# Patient Record
Sex: Female | Born: 1955 | Race: Black or African American | Hispanic: No | Marital: Single | Smoking: Former smoker
Health system: Southern US, Community
[De-identification: ages and names within clinical notes are randomized; demographics above are authoritative.]

## PROBLEM LIST (undated history)

## (undated) DIAGNOSIS — E119 Type 2 diabetes mellitus without complications: Secondary | ICD-10-CM

## (undated) DIAGNOSIS — E785 Hyperlipidemia, unspecified: Secondary | ICD-10-CM

## (undated) HISTORY — PX: KNEE SURGERY: SHX244

## (undated) HISTORY — PX: CARDIAC CATHETERIZATION: SHX172

---

## 2015-08-23 ENCOUNTER — Emergency Department (HOSPITAL_COMMUNITY)
Admission: EM | Admit: 2015-08-23 | Discharge: 2015-08-23 | Disposition: A | Discharge status: Home / Self Care | Payer: Self-pay | Attending: Emergency Medicine | Admitting: Emergency Medicine

## 2015-08-23 ENCOUNTER — Encounter (HOSPITAL_COMMUNITY): Payer: Self-pay | Admitting: Emergency Medicine

## 2015-08-23 DIAGNOSIS — H5711 Ocular pain, right eye: Secondary | ICD-10-CM | POA: Insufficient documentation

## 2015-08-23 DIAGNOSIS — H5789 Other specified disorders of eye and adnexa: Secondary | ICD-10-CM

## 2015-08-23 DIAGNOSIS — H578 Other specified disorders of eye and adnexa: Secondary | ICD-10-CM | POA: Insufficient documentation

## 2015-08-23 DIAGNOSIS — R51 Headache: Secondary | ICD-10-CM | POA: Insufficient documentation

## 2015-08-23 DIAGNOSIS — H53149 Visual discomfort, unspecified: Secondary | ICD-10-CM | POA: Insufficient documentation

## 2015-08-23 HISTORY — DX: Type 2 diabetes mellitus without complications: E11.9

## 2015-08-23 MED ORDER — TETRACAINE HCL 0.5 % OP SOLN
1.0000 [drp] | Freq: Once | OPHTHALMIC | Status: AC
  Administered 2015-08-23: 1 [drp] via OPHTHALMIC
  Filled 2015-08-23: qty 2

## 2015-08-23 MED ORDER — FLUORESCEIN SODIUM 1 MG OP STRP
1.0000 | ORAL_STRIP | Freq: Once | OPHTHALMIC | Status: AC
Start: 2015-08-23 — End: 2015-08-23
  Administered 2015-08-23: 1 via OPHTHALMIC
  Filled 2015-08-23: qty 1

## 2015-08-23 NOTE — ED Provider Notes (Signed)
CSN: [id number]     Arrival date & time 08/23/15  1316 History  By signing my name below, I, Essence Howell, attest that this documentation has been prepared under the direction and in the presence of Danelle Berry, PA-C Electronically Signed: Charline Bills, ED Scribe 08/23/2015 at 2:56 PM.   Chief Complaint  Patient presents with  . Eye Pain   The history is provided by the patient. No language interpreter was used.   HPI Comments: Karina Pierce is a 60 y.o. female who presents to the Emergency Department complaining of sudden onset of constant, 7/10 right eye pain onset last night. Pt states that she was sitting on her couch watching tv when she initially noticed right eye pain. She describes eye pain as "soreness" that is exacerbated with focusing and palpation. Pt also reports associated eye redness, photophobia and HA only above the right eye. No treatments tried PTA. She denies fever, facial swelling, eye pain with movement, visual disturbances, eye dischagre, nausea, vomiting, neck pain. No h/o similar symptoms.   No past medical history on file. No past surgical history on file. No family history on file. Social History  Substance Use Topics  . Smoking status: Not on file  . Smokeless tobacco: Not on file  . Alcohol Use: Not on file   OB History    No data available     Review of Systems  All other systems reviewed and are negative.  Allergies  Review of patient's allergies indicates not on file.  Home Medications   Prior to Admission medications   Not on File   BP 134/72 mmHg  Pulse 90  Temp(Src) 98.4 F (36.9 C) (Oral)  Resp 16  SpO2 98% Physical Exam  Constitutional: She is oriented to person, place, and time. She appears well-developed and well-nourished. No distress.  Appears very uncomfortable  HENT:  Head: Normocephalic and atraumatic.  Right Ear: External ear normal.  Left Ear: External ear normal.  Nose: Nose normal.  Mouth/Throat: Oropharynx is clear and  moist. No oropharyngeal exudate.  Eyes: EOM and lids are normal. Pupils are equal, round, and reactive to light. Right eye exhibits no discharge. No foreign body present in the right eye. Left eye exhibits no discharge. No foreign body present in the left eye. Right conjunctiva is injected. Left conjunctiva is not injected. No scleral icterus. Right eye exhibits normal extraocular motion and no nystagmus. Left eye exhibits normal extraocular motion and no nystagmus. Pupils are equal.  Slit lamp exam:      The right eye shows no fluorescein uptake and no anterior chamber bulge.       The left eye shows no anterior chamber bulge.      Visual Acuity  Right Eye Distance: 20/25 without correcitve lens Left Eye Distance: 20/25 without corrective lens Bilateral Distance: 20/25 without corrective lens   Neck: Normal range of motion. Neck supple. No JVD present. No tracheal deviation present.  Cardiovascular: Normal rate and regular rhythm.   Pulmonary/Chest: Effort normal and breath sounds normal. No stridor. No respiratory distress.  Musculoskeletal: Normal range of motion. She exhibits no edema.  Lymphadenopathy:    She has no cervical adenopathy.  Neurological: She is alert and oriented to person, place, and time. She exhibits normal muscle tone. Coordination normal.  Skin: Skin is warm and dry. No rash noted. She is not diaphoretic. No erythema. No pallor.  Psychiatric: She has a normal mood and affect. Her behavior is normal. Judgment and thought content normal.  Nursing note and vitals reviewed.  ED Course  Procedures (including critical care time) DIAGNOSTIC STUDIES: Oxygen Saturation is 98% on RA, normal by my interpretation.    COORDINATION OF CARE: 1:43 PM-Discussed treatment plan which includes fluorescein exam and f/u with ophthalmology with pt at bedside and pt agreed to plan.   Labs Review Labs Reviewed - No data to display  Imaging Review No results found.   EKG  Interpretation None      MDM   Pt with sudden onset right eye pain with diffuse conjunctival erythema, no discharge, no injury, vision normal.  Pt has severe photophobia.  EOM's normal.  Fluorescein, no abrasion, ulceration or FB visualized.  No visual acuity deficit.  No hx of contact wearing.  Unable to assess anterior chamber with slit lamp, however no visualized anterior chamber bulge, no ciliary injection IOP:  Left eye 18, right eye 10 Seen in a shared visit with Dr. Adriana Simas. Ophthalmology consulted - Discussed with Dr. Randon [place] who who instructed pt to come to for inoffice eval right now.  No other tx indicated per Dr. Randon [place]. Pt was instructed re: plan, she has transportation, will drive directly to his office.    Final diagnoses:  None   I personally performed the services described in this documentation, which was scribed in my presence. The recorded information has been reviewed and is accurate.      Danelle Berry, PA-C 08/23/15 2006  Donnetta Hutching, MD 08/25/15 [phone]

## 2015-08-23 NOTE — Discharge Instructions (Signed)
Please go to see Dr. Randon [place] now for ophthalmology evaluation.  Pain Without a Known Cause WHAT IS PAIN WITHOUT A KNOWN CAUSE? Pain can occur in any part of the body and can range from mild to severe. Sometimes no cause can be found for why you are having pain. Some types of pain that can occur without a known cause include:   Headache.  Back pain.  Abdominal pain.  Neck pain. HOW IS PAIN WITHOUT A KNOWN CAUSE DIAGNOSED?  Your health care provider will try to find the cause of your pain. This may include:  Physical exam.  Medical history.  Blood tests.  Urine tests.  X-rays. If no cause is found, your health care provider may diagnose you with pain without a known cause.  IS THERE TREATMENT FOR PAIN WITHOUT A CAUSE?  Treatment depends on the kind of pain you have. Your health care provider may prescribe medicines to help relieve your pain.  WHAT CAN I DO AT HOME FOR MY PAIN?   Take medicines only as directed by your health care provider.  Stop any activities that cause pain. During periods of severe pain, bed rest may help.  Try to reduce your stress with activities such as yoga or meditation. Talk to your health care provider for other stress-reducing activity recommendations.  Exercise regularly, if approved by your health care provider.  Eat a healthy diet that includes fruits and vegetables. This may improve pain. Talk to your health care provider if you have any questions about your diet. WHAT IF MY PAIN DOES NOT GET BETTER?  If you have a painful condition and no reason can be found for the pain or the pain gets worse, it is important to follow up with your health care provider. It may be necessary to repeat tests and look further for a possible cause.    This information is not intended to replace advice given to you by your health care provider. Make sure you discuss any questions you have with your health care provider.   Document Released: 03/11/2001 Document  Revised: 07/07/2014 Document Reviewed: 11/01/2013 Elsevier Interactive Patient Education [employer]

## 2015-08-23 NOTE — ED Notes (Signed)
Patient able to ambulate independently  

## 2015-08-23 NOTE — ED Notes (Signed)
Patient states started having eye pain last night and photophobia in R eye.   Patient denies drainage.   Patient states slight headache over the R eye.

## 2015-10-12 ENCOUNTER — Emergency Department (HOSPITAL_COMMUNITY)
Admission: EM | Admit: 2015-10-12 | Discharge: 2015-10-12 | Disposition: A | Discharge status: Home / Self Care | Payer: Self-pay | Attending: Emergency Medicine | Admitting: Emergency Medicine

## 2015-10-12 ENCOUNTER — Encounter (HOSPITAL_COMMUNITY): Payer: Self-pay | Admitting: *Deleted

## 2015-10-12 DIAGNOSIS — E119 Type 2 diabetes mellitus without complications: Secondary | ICD-10-CM | POA: Insufficient documentation

## 2015-10-12 DIAGNOSIS — R220 Localized swelling, mass and lump, head: Secondary | ICD-10-CM | POA: Insufficient documentation

## 2015-10-12 DIAGNOSIS — Z87891 Personal history of nicotine dependence: Secondary | ICD-10-CM | POA: Insufficient documentation

## 2015-10-12 DIAGNOSIS — K0889 Other specified disorders of teeth and supporting structures: Secondary | ICD-10-CM | POA: Insufficient documentation

## 2015-10-12 DIAGNOSIS — Z792 Long term (current) use of antibiotics: Secondary | ICD-10-CM | POA: Insufficient documentation

## 2015-10-12 MED ORDER — ACETAMINOPHEN-CODEINE #3 300-30 MG PO TABS: 1.0000 | ORAL_TABLET | Freq: Four times a day (QID) | ORAL | Status: DC | PRN

## 2015-10-12 MED ORDER — IBUPROFEN 800 MG PO TABS: 800.0000 mg | ORAL_TABLET | Freq: Three times a day (TID) | ORAL | Status: DC | PRN

## 2015-10-12 MED ORDER — PENICILLIN V POTASSIUM 500 MG PO TABS: 500.0000 mg | ORAL_TABLET | Freq: Four times a day (QID) | ORAL | Status: AC

## 2015-10-12 NOTE — ED Notes (Signed)
Pt arrives with c/o dental pain and states she has an abscess on her right upper molar. Pt is soft spoken upon exam and states she is diabetic. Pt refuses care for her diabetes and states she has been running about 300-400 with her cbg. Pt refuses insulin or a cbg check.

## 2015-10-12 NOTE — Discharge Instructions (Signed)
Community Resource Guide Financial Assistance °The United Way’s “211” is a great source of information about community services available.  Access by dialing 2-1-1 from anywhere in [place], or by website -  www.nc211.org.  ° °Other Local Resources (Updated 07/2015) ° °Financial Assistance °  °Services ° °  °Phone Number and Address  °[facility] • Low-cost medical care - 1st and 3rd Saturday of every month °• Must not qualify for public or private insurance and must have limited income [phone] °[address] °[place], [place]  °  °[place] [facility] • Child care °• Emergency assistance for housing and utilities °• Food stamps °• Medicaid [phone] °[address] °[place], [place] [address] °  °[facility] • Low-cost medical care for children, communicable diseases, sexually-transmitted diseases, immunizations, maternity care, women’s health and family planning [phone] °[address] °[place], [place] [address]  °[facility]  • Medication assistance for [place] residents °• Must meet income requirements [phone] °[address] [place], [place].    °[place] [facility] • Child care °• Emergency assistance for housing and utilities °• Food stamps °• Medicaid [phone] °[address] °[place], [place] [address]  °[facility]  • Low-cost medical care,  °• Monday through Friday, 9 am to 6 pm.   Accepts Medicare/Medicaid, and self-pay [phone] °[address] °[place], [place] [address]  °[facility] • Low-cost medical care - Monday through Friday, 8:30 am - 5:30 pm °• Accepts Medicaid and self-pay [phone] °[address] [place], [place] [address]   °[facility] • Primary medical care, including for those with sickle cell disease °• Accepts Medicare,  Medicaid, insurance and self-pay [phone] °[address] °[place], [place]  °[facility]  • Primary medical care °• Accepts Medicare, Medicaid, insurance and self-pay [phone] °[address] [place], [place] [address] °  °[place] [facility] • Child care °• Emergency assistance for housing and utilities °• Food stamps °• Medicaid [phone] °[address] °[place], [place] [address]  °[place] [facility] • Child care °• Emergency assistance for housing and utilities °• Food stamps °• Medicaid 336-641-3000 °[address] °[place], [place] [address] °  °[place] [facility] • Medication assistance for [place] residents with no insurance only °• Must have a primary care doctor [phone] °[address] °[place], [place]  °[facility]  • Primary medical care °• Accepts Medicare, Medicaid, insurance  [phone] °[address] °[place], [place]  °[facility] ° • Medication assistance [phone]  °[facility]  • Primary medical care °• Accepts Medicare, Medicaid, insurance and self-pay [phone] °[address] [place], [place] [address]  °[facility]  • Primary medical care °• Accepts Medicare, Medicaid, insurance and self-pay [phone] °[address] [place], [place] [address]  °[facility] • For [place] residents between the ages of 18 and 64 who do not have any form of health insurance, Medicare, Medicaid, or VA benefits.  Services are provided free of charge to uninsured patients who fall within federal poverty guidelines.   °• Hours: Tuesdays and Thursdays, 4:15 - 8 pm [phone] °[address] °[place], [place] [address]  °[facility] ° ° ° • Primary medical care °• Dental   care  Nutritional counseling  Pharmacy  Accepts Medicaid, Medicare,  most insurance.  Fees are adjusted based on ability to pay.   [phone] [facility] [address] [place], [place]  [phone] Phineas Real [facility] [address] [place], [place]  [phone] [facility] [place], [place]  [phone] [facility], [address] [place], [place]  [phone] [facility] [address] [place], [place]  [facility]  Womens health and family planning [phone] [address] [place], [place]  [facility] Department of Social Services  Child care  Emergency assistance for housing and [employer]  Medicaid [phone] [address] [place], [place] [address]   [facility]    Ages 39 and older  Hours: Mondays and Thursdays, 7:00 am - 9:00 am Patients are seen on a first come, first served basis. [phone], ext. [phone] [place], [place]  [facility] Division of Social Services  Child care  Emergency assistance for housing and [employer]  Medicaid [phone] [address] [place], [place] [address]  [facility]  Medication assistance  Rental assistance  Food pantry  Medication assistance  Housing assistance  Emergency food distribution  Utility assistance [phone] [address] [place], [place]  [phone]  [address] [place], [place] [address] Hours: Tuesdays and Thursdays from 9am - 12 noon by appointment only  [phone] [address] [place], [place] [address]  [facility] - Lanae Boast   Accepts private insurance, [place], and [place].  Payment is based on a sliding scale for those without insurance.  Hours: Mondays, Tuesdays and Thursdays, 8:30 am - 5:30 pm.   [phone] [address] [place], [place]  [facility] - Family Medicine at  [facility], [place], and [place].  Payment is based on a sliding scale for those without insurance. [phone] [address] [place], [place]  [facility] - Pediatrics at E. Scientist, research (physical sciences), [employer], and [place].  Payment is based on a sliding scale for those without insurance [phone] [address], [employer], [place]  [facility] - Pediatrics at [employer], [place], and [place].  Payment is based on a sliding scale for those without insurance. [phone] [address] [place], [place]  [facility] - Pediatrics at [facility], [place], and [place].  Payment is based on a sliding scale for those without insurance. [phone], [phone] [place], [place].    [facility]  Maternity care.  Accepts Medicaid and self-pay. [phone] [address] [place], [place]  [employer] [facility] The United Ways 211 is a great source of information about community services available.  Access by dialing 2-1-1 from anywhere in [place], or by website -  PooledIncome.pl.   Other Local Resources (Updated 07/2015)  Dental  Care   Services    Phone Number and Address  Cost  [facility] For children 26 - 13 years of age:   Cleaning  Tooth brushing/flossing instruction  Sealants, fillings, crowns  Extractions  Emergency treatment  [phone] [address] [place], [place] [address] Charges based on family income.  Medicaid and some insurance plans accepted.     [facility] -  [place]  Cleaning  Sealants, fillings, crowns  Extractions  Emergency treatment [phone] [address] [place], [place]  Pregnant women 58 years of age or  older with a Medicaid card  [facility] [place]  Cleaning  Sealants, fillings, crowns  Extractions  Emergency treatment [phone] [address] [place], [place] Pregnant women 58 years of age or older with a Medicaid card  [facility] For children 340 - 58 years of age:   Cleaning  Tooth brushing/flossing instruction  Sealants, fillings, crowns  Extractions  Emergency treatment Limited orthodontic services for patients with Medicaid [phone] [address] [place], [place] [phone] Medicaid and [facility] Health Choice cover for children up to age 58 and pregnant women.  Parents of children up to age 58 without Medicaid pay a reduced fee at time of service.  [facility] [place] For children 680 - 58 years of age:   Cleaning  Tooth brushing/flossing instruction  Sealants, fillings, crowns  Extractions  Emergency treatment Limited orthodontic services for patients with Medicaid [phone] [address] [place], [place].  Medicaid and [place] Health Choice cover for children up to age 58 and pregnant women.  Parents of children up to age 58 without Medicaid pay a reduced fee.  [facility]  Cleaning  Sealants, fillings, crowns  Extractions  Hours: Tuesdays and Thursdays, 4:15 - 8 pm [phone] [address] [place], [place] [phone] Services free of charge to [facility] residents ages 18-64 who do not have health insurance, Medicare, [place], or [place] benefits and fall within federal poverty guidelines  [facility]    Provides dental care in addition to primary medical care, nutritional counseling, and pharmacy:  Nurse, mental healthCleaning  Sealants, fillings, crowns  Extractions                  [phone] [facility], [address] [place],  [place]  [phone] [facility], [address] [place], [place]  [phone] [facility] [place], [place]  [phone] [facility], [address] [place], [place]  [phone] [facility] [address] [place], [place] Accepts [place], [place], most insurance.  Also provides services available to all with fees adjusted based on ability to pay.    [facility]  Cleaning  Tooth brushing/flossing instruction  Sealants, fillings, crowns  Extractions  Emergency treatment Hours: Tuesdays, Thursdays, and Fridays from 8 am to 5 pm by appointment only. [phone] [address] [place], [place] [phone] [facility] residents with Medicaid (depending on eligibility) and children with [facility] Health Choice - call for more information.  [facility]  Extractions only  Hours: 2nd and 4th Thursday of each month from 6:30 am - 9 am.   [phone] [address] [place], [place] [phone] Ages 7918 and older only.  Patients are seen on a first come, first served basis.  [employer]  [employer]  Fillings  Extractions  Orthodontics  Endodontics  Implants/Crowns/Bridges  Complete and partial dentures [phone] [place], [place] Patients must complete an application for services.  There is often a waiting list.

## 2015-10-12 NOTE — ED Provider Notes (Signed)
CSN: [id number]     Arrival date & time 10/12/15  1312 History   First MD Initiated Contact with Patient 10/12/15 1334     Chief Complaint  Patient presents with  . Dental Pain     (Consider location/radiation/quality/duration/timing/severity/associated sxs/prior Treatment) The history is provided by the patient.     Pt with hx DM presents with right upper dental pain and facial swelling that began today. Feels like prior dental abscess.  Denies fevers, sore throat, difficulty swallowing or breathing.   Reports her blood sugars have been in the 300s and she has been out of her insulin, but she does "not want to deal with that today."    Past Medical History  Diagnosis Date  . Diabetes mellitus without complication [facility])    Past Surgical History  Procedure Laterality Date  . Knee surgery     History reviewed. No pertinent family history. Social History  Substance Use Topics  . Smoking status: Former Games developer  . Smokeless tobacco: None  . Alcohol Use: No   OB History    No data available     Review of Systems  Constitutional: Negative for fever and chills.  HENT: Positive for dental problem. Negative for drooling, sore throat and trouble swallowing.   Musculoskeletal: Negative for neck pain and neck stiffness.  Skin: Negative for color change.  Allergic/Immunologic: Positive for immunocompromised state (diabetic ).  Neurological: Negative for speech difficulty.  Hematological: Does not bruise/bleed easily.  Psychiatric/Behavioral: Negative for self-injury.      Allergies  Review of patient's allergies indicates no known allergies.  Home Medications   Prior to Admission medications   Medication Sig Start Date End Date Taking? Authorizing Provider  acetaminophen-codeine (TYLENOL #3) 300-30 MG tablet Take 1-2 tablets by mouth every 6 (six) hours as needed for moderate pain.    Historical Provider, MD  penicillin v potassium (VEETID) 250 MG tablet Take 250 mg by mouth 4  (four) times daily.    Historical Provider, MD   BP 120/79 mmHg  Pulse 86  Temp(Src) 98.6 F (37 C) (Oral)  Resp 18  SpO2 98% Physical Exam  Constitutional: She appears well-developed and well-nourished. No distress.  HENT:  Head: Normocephalic and atraumatic.  Mouth/Throat: Uvula is midline and oropharynx is clear and moist. Mucous membranes are not dry. No uvula swelling. No oropharyngeal exudate, posterior oropharyngeal edema, posterior oropharyngeal erythema or tonsillar abscesses.  Right upper molar with filling in place, very loose, tender to light palpation.  No focal facial swelling but there is adjacent tenderness.    Neck: Trachea normal, normal range of motion and phonation normal. Neck supple. No tracheal tenderness present. No rigidity. No tracheal deviation, no edema, no erythema and normal range of motion present.  Cardiovascular: Normal rate.   Pulmonary/Chest: Effort normal and breath sounds normal. No stridor.  Lymphadenopathy:    She has no cervical adenopathy.  Neurological: She is alert.  Skin: She is not diaphoretic.  Nursing note and vitals reviewed.   ED Course  Procedures (including critical care time) Labs Review Labs Reviewed - No data to display  Imaging Review No results found. I have personally reviewed and evaluated these images and lab results as part of my medical decision-making.   EKG Interpretation None      MDM   Final diagnoses:  Pain, dental    Afebrile, nontoxic patient with new dental pain with possible early abscess.  No concerning findings on exam.  Doubt Ludwig's angina.  Pt declined  any help with her diabetes medications or with follow up.  I suggested that we at the very least check a CBG, I also offered her a prescription for her home insulin, as well as help from the case manager in finding primary care.  She refused each one of these.  We discussed the dangers and risks of elevated blood sugar and the difficulty it might  pose in fighting off this infection.  Pt verbalized understanding but continued to decline.  She has a reason for her refusal, wants to go home and deal only with her tooth today.  I will provide resources for her when she is ready to pursue these and I have advised her to drink plenty of water and follow a diabetic diet to help with her suspected elevated blood sugars.  D/C home with antibiotic, pain medication and dental follow up.  Discussed findings, treatment, and follow up  with patient.  Pt given return precautions.  Pt verbalizes understanding and agrees with plan.         Trixie Dredgemily Shawniece Oyola, PA-C 10/12/15 1526  Lyndal Pulleyaniel Knott, MD 10/13/15 [phone]

## 2015-10-12 NOTE — ED Notes (Signed)
Pt is in stable condition upon d/c and ambulates from ED. 

## 2016-02-25 ENCOUNTER — Emergency Department (HOSPITAL_COMMUNITY)
Admission: EM | Admit: 2016-02-25 | Discharge: 2016-02-25 | Disposition: A | Discharge status: Home / Self Care | Payer: Self-pay | Attending: Emergency Medicine | Admitting: Emergency Medicine

## 2016-02-25 ENCOUNTER — Encounter (HOSPITAL_COMMUNITY): Payer: Self-pay | Admitting: Emergency Medicine

## 2016-02-25 DIAGNOSIS — K047 Periapical abscess without sinus: Secondary | ICD-10-CM | POA: Insufficient documentation

## 2016-02-25 DIAGNOSIS — E119 Type 2 diabetes mellitus without complications: Secondary | ICD-10-CM | POA: Insufficient documentation

## 2016-02-25 DIAGNOSIS — Z87891 Personal history of nicotine dependence: Secondary | ICD-10-CM | POA: Insufficient documentation

## 2016-02-25 DIAGNOSIS — K0889 Other specified disorders of teeth and supporting structures: Secondary | ICD-10-CM

## 2016-02-25 MED ORDER — NAPROXEN 500 MG PO TABS: 500.0000 mg | ORAL_TABLET | Freq: Two times a day (BID) | ORAL | 0 refills | Status: DC

## 2016-02-25 MED ORDER — PENICILLIN V POTASSIUM 250 MG PO TABS
500.0000 mg | ORAL_TABLET | Freq: Once | ORAL | Status: AC
  Administered 2016-02-25: 500 mg via ORAL
  Filled 2016-02-25: qty 2

## 2016-02-25 MED ORDER — NAPROXEN 250 MG PO TABS
500.0000 mg | ORAL_TABLET | Freq: Two times a day (BID) | ORAL | Status: DC
  Administered 2016-02-25: 250 mg via ORAL
  Filled 2016-02-25: qty 2

## 2016-02-25 MED ORDER — PENICILLIN V POTASSIUM 500 MG PO TABS: 500.0000 mg | ORAL_TABLET | Freq: Four times a day (QID) | ORAL | 0 refills | Status: DC

## 2016-02-25 NOTE — ED Notes (Signed)
Declined W/C at D/C and was escorted to lobby by RN. 

## 2016-02-25 NOTE — ED Provider Notes (Signed)
[facility] Provider Note   CSN: [id number] Arrival date & time: 02/25/16  1204  By signing my name below, I, Javier Docker, attest that this documentation has been prepared under the direction and in the presence of Sharilyn Sites, PA-C. Electronically Signed: Javier Docker, ER Scribe. 02/09/2016. 1:06 PM.   History   Chief Complaint Chief Complaint  Patient presents with  . Dental Pain    The history is provided by the patient. No language interpreter was used.    HPI Comments: Karina Pierce is a 60 y.o. female who presents to the Emergency Department complaining of pain and swelling in her left upper gum. She has a broken tooth in that area. She has called her dentist to set an appointment, but there is nothing available until next week. She has NKDA and has taken penicillin in the past. She has a past hx of dental abscesses.    Past Medical History:  Diagnosis Date  . Diabetes mellitus without complication (HCC)     There are no active problems to display for this patient.   Past Surgical History:  Procedure Laterality Date  . KNEE SURGERY      OB History    No data available       Home Medications    Prior to Admission medications   Medication Sig Start Date End Date Taking? Authorizing Provider  acetaminophen-codeine (TYLENOL #3) 300-30 MG tablet Take 1-2 tablets by mouth every 6 (six) hours as needed for moderate pain. 10/12/15   Trixie Dredge, PA-C  ibuprofen (ADVIL,MOTRIN) 800 MG tablet Take 1 tablet (800 mg total) by mouth every 8 (eight) hours as needed for mild pain or moderate pain. 10/12/15   Trixie Dredge, PA-C    Family History No family history on file.  Social History Social History  Substance Use Topics  . Smoking status: Former Games developer  . Smokeless tobacco: Not on file  . Alcohol use No     Allergies   Review of patient's allergies indicates no known allergies.   Review of Systems Review of Systems  Constitutional: Negative for  chills and fever.  HENT: Positive for dental problem and facial swelling.   Gastrointestinal: Negative for nausea and vomiting.     Physical Exam Updated Vital Signs BP 119/77 (BP Location: Right Arm)   Pulse 91   Temp 98.5 F (36.9 C) (Oral)   Resp 18   Ht 5\' 10"  (1.778 m)   Wt 220 lb (99.8 kg)   SpO2 97%   BMI 31.57 kg/m   Physical Exam  Constitutional: She is oriented to person, place, and time. She appears well-developed and well-nourished.  HENT:  Head: Normocephalic and atraumatic.  Mouth/Throat: Oropharynx is clear and moist.  Teeth largely in poor dentition, left upper molars broken with central cavities noted, surrounding gingiva swollen along the lateral aspect with small abscess, no fluctuance or active drainage, handling secretions appropriately, no trismus, no facial or neck swelling, normal phonation without stridor  Eyes: Conjunctivae and EOM are normal. Pupils are equal, round, and reactive to light.  Neck: Normal range of motion.  Cardiovascular: Normal rate, regular rhythm and normal heart sounds.   Pulmonary/Chest: Effort normal and breath sounds normal.  Abdominal: Soft. Bowel sounds are normal.  Musculoskeletal: Normal range of motion.  Neurological: She is alert and oriented to person, place, and time.  Skin: Skin is warm and dry.  Psychiatric: She has a normal mood and affect.  Nursing note and vitals reviewed.  ED Treatments / Results  DIAGNOSTIC STUDIES: Oxygen Saturation is 97% on RA, normal by my interpretation.    COORDINATION OF CARE: 1:08 PM Discussed treatment plan with pt at bedside which includes abx and pain management and pt agreed to plan.  Labs (all labs ordered are listed, but only abnormal results are displayed) Labs Reviewed - No data to display  EKG  EKG Interpretation None       Radiology No results found.  Procedures Procedures (including critical care time)  Medications Ordered in ED Medications  naproxen  (NAPROSYN) tablet 500 mg (250 mg Oral Given 02/25/16 1313)  penicillin v potassium (VEETID) tablet 500 mg (500 mg Oral Given 02/25/16 1312)     Initial Impression / Assessment and Plan / ED Course  I have reviewed the triage vital signs and the nursing notes.  Pertinent labs & imaging results that were available during my care of the patient were reviewed by me and considered in my medical decision making (see chart for details).  Clinical Course   60 year old female who with left upper dental abscess. This is small and without fluctuance or drainable fluid collection at this time. She has no facial or neck swelling. She is handling her secretions well. Clinically not concerning for Ludwig's angina. Patient will be started on antibiotics. She has follow-up scheduled with her dentist next week.  Discussed plan with patient, she acknowledged understanding and agreed with plan of care.  Return precautions given for new or worsening symptoms.  Final Clinical Impressions(s) / ED Diagnoses   Final diagnoses:  Pain, dental    New Prescriptions New Prescriptions   NAPROXEN (NAPROSYN) 500 MG TABLET    Take 1 tablet (500 mg total) by mouth 2 (two) times daily with a meal.   PENICILLIN V POTASSIUM (VEETID) 500 MG TABLET    Take 1 tablet (500 mg total) by mouth 4 (four) times daily.   I personally performed the services described in this documentation, which was scribed in my presence. The recorded information has been reviewed and is accurate.        Garlon HatchetLisa M Taima Rada, PA-C 02/25/16 1321    Maia PlanJoshua G Long, MD 02/25/16 [phone]

## 2016-02-25 NOTE — Discharge Instructions (Signed)
Take the prescribed medication as directed. °Follow-up with your dentist next week as scheduled. °Return to the ED for new or worsening symptoms. ° °

## 2016-02-25 NOTE — ED Triage Notes (Signed)
Pt c/o abscess in her teeth in the upper back side of jaw. Pt has no obvious swelling or deformities. Pt has very poor dentition. Pt states she needs to have some teeth removed. Pt requesting penicillin. Pt has dentist that she is going to go see Monday.

## 2016-04-08 ENCOUNTER — Emergency Department (HOSPITAL_COMMUNITY)
Admission: EM | Admit: 2016-04-08 | Discharge: 2016-04-08 | Disposition: A | Discharge status: Home / Self Care | Payer: Self-pay | Attending: Emergency Medicine | Admitting: Emergency Medicine

## 2016-04-08 ENCOUNTER — Encounter (HOSPITAL_COMMUNITY): Payer: Self-pay | Admitting: Emergency Medicine

## 2016-04-08 DIAGNOSIS — K029 Dental caries, unspecified: Secondary | ICD-10-CM | POA: Insufficient documentation

## 2016-04-08 DIAGNOSIS — E119 Type 2 diabetes mellitus without complications: Secondary | ICD-10-CM | POA: Insufficient documentation

## 2016-04-08 DIAGNOSIS — K0889 Other specified disorders of teeth and supporting structures: Secondary | ICD-10-CM

## 2016-04-08 DIAGNOSIS — Z87891 Personal history of nicotine dependence: Secondary | ICD-10-CM | POA: Insufficient documentation

## 2016-04-08 DIAGNOSIS — K047 Periapical abscess without sinus: Secondary | ICD-10-CM | POA: Insufficient documentation

## 2016-04-08 MED ORDER — IBUPROFEN 400 MG PO TABS
600.0000 mg | ORAL_TABLET | Freq: Once | ORAL | Status: AC
  Administered 2016-04-08: 600 mg via ORAL
  Filled 2016-04-08: qty 1

## 2016-04-08 MED ORDER — NAPROXEN 500 MG PO TABS: 500.0000 mg | ORAL_TABLET | Freq: Two times a day (BID) | ORAL | 0 refills | Status: DC

## 2016-04-08 MED ORDER — IBUPROFEN 600 MG PO TABS: 600.0000 mg | ORAL_TABLET | Freq: Three times a day (TID) | ORAL | 0 refills | Status: DC

## 2016-04-08 MED ORDER — PENICILLIN V POTASSIUM 500 MG PO TABS: 500.0000 mg | ORAL_TABLET | Freq: Four times a day (QID) | ORAL | 0 refills | Status: AC

## 2016-04-08 MED ORDER — CLINDAMYCIN HCL 150 MG PO CAPS
300.0000 mg | ORAL_CAPSULE | Freq: Once | ORAL | Status: AC
  Administered 2016-04-08: 300 mg via ORAL
  Filled 2016-04-08: qty 2

## 2016-04-08 MED ORDER — CLINDAMYCIN HCL 300 MG PO CAPS: 300.0000 mg | ORAL_CAPSULE | Freq: Four times a day (QID) | ORAL | 0 refills | Status: DC

## 2016-04-08 NOTE — ED Triage Notes (Signed)
Pt presents to ED for assessment of dental pain.  Pt has three areas in her mouth (upper right, lower center, and left upper) that have been bothering her "for a while" put pt states yesterday the right side of her face became sore, and c/o pain radiating down right neck.  No fever noted, pain with swallowing, but denies difficulty breathing.

## 2016-04-08 NOTE — ED Provider Notes (Signed)
[facility] Provider Note   CSN: [id number] Arrival date & time: 04/08/16  1520     History   Chief Complaint Chief Complaint  Patient presents with  . Dental Pain    HPI Karina Pierce is a 60 y.o. female who presents with dental pain. PMH significant for DM and poor dentition with hx of abscess. She states that over the past day her pain has gotten worse. She has had this pain before. She has pain in the back upper right wisdom tooth, front bottom incisor, and back upper left molar. 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 She reports pain opening and closing her jaw, pain with swallowing, and some drainage. Denies fever, inability to swallow, drooling, SOB.  HPI  Past Medical History:  Diagnosis Date  . Diabetes mellitus without complication (HCC)     There are no active problems to display for this patient.   Past Surgical History:  Procedure Laterality Date  . KNEE SURGERY      OB History    No data available       Home Medications    Prior to Admission medications   Medication Sig Start Date End Date Taking? Authorizing Provider  acetaminophen-codeine (TYLENOL #3) 300-30 MG tablet Take 1-2 tablets by mouth every 6 (six) hours as needed for moderate pain. 10/12/15   Trixie Dredge, PA-C  ibuprofen (ADVIL,MOTRIN) 600 MG tablet Take 1 tablet (600 mg total) by mouth 3 (three) times daily. 04/08/16   Bethel Born, PA-C  naproxen (NAPROSYN) 500 MG tablet Take 1 tablet (500 mg total) by mouth 2 (two) times daily with a meal. 02/25/16   Garlon Hatchet, PA-C  penicillin v potassium (VEETID) 500 MG tablet Take 1 tablet (500 mg total) by mouth 4 (four) times daily. 04/08/16 04/15/16  Bethel Born, PA-C    Family History History reviewed. No pertinent family history.  Social History Social History  Substance Use Topics  . Smoking status: Former Games developer  . Smokeless tobacco: Never Used    . Alcohol use No     Allergies   Review of patient's allergies indicates no known allergies.   Review of Systems Review of Systems  Constitutional: Negative for fever.  HENT: Positive for dental problem. Negative for drooling.      Physical Exam Updated Vital Signs BP 141/80 (BP Location: Left Arm)   Pulse 83   Temp 98.1 F (36.7 C) (Oral)   Resp 14   Ht 5\' 10"  (1.778 m)   Wt 100.5 kg   SpO2 100%   BMI 31.80 kg/m   Physical Exam  Constitutional: She is oriented to person, place, and time. She appears well-developed and well-nourished. No distress.  HENT:  Head: Normocephalic and atraumatic.  Mouth/Throat: No trismus in the jaw. Abnormal dentition. Dental abscesses and dental caries present.  Poor dentition throughout. Upper right wisdom tooth is loose and tender to palpation without evidence of abscess. Front lower incisor is decayed and loose without evidence of abscess. Upper left molar is tender to palpation, decayed, and has a small abscess which is already draining.  Eyes: Conjunctivae are normal. Pupils are equal, round, and reactive to light. Right eye exhibits no discharge. Left eye exhibits no discharge. No scleral icterus.  Neck: Normal range of motion. Neck supple.  Cardiovascular: Normal rate and regular rhythm.   No murmur heard. Pulmonary/Chest: Effort normal and breath sounds  normal. No respiratory distress.  Abdominal: Soft. She exhibits no distension. There is no tenderness.  Musculoskeletal: She exhibits no edema.  Neurological: She is alert and oriented to person, place, and time.  Skin: Skin is warm and dry.  Psychiatric: She has a normal mood and affect. Her behavior is normal.  Nursing note and vitals reviewed.    ED Treatments / Results  Labs (all labs ordered are listed, but only abnormal results are displayed) Labs Reviewed - No data to display  EKG  EKG Interpretation None       Radiology No results  found.  Procedures Procedures (including critical care time)  Medications Ordered in ED Medications  clindamycin (CLEOCIN) capsule 300 mg (300 mg Oral Given 04/08/16 1612)  ibuprofen (ADVIL,MOTRIN) tablet 600 mg (600 mg Oral Given 04/08/16 1621)     Initial Impression / Assessment and Plan / ED Course  I have reviewed the triage vital signs and the nursing notes.  Pertinent labs & imaging results that were available during my care of the patient were reviewed by me and considered in my medical decision making (see chart for details).  Clinical Course   60 year old female presents with dental pain due to dental abscess and overall poor dentition. Patient is afebrile, not tachycardic or tachypneic, normotensive, and not hypoxic. Low concern for Ludwig's angina. Dental block offered - pt declined. Treated with antibiotics and NSAIDs in ED. Rx for same given. Dental resource guide given. Patient is NAD, non-toxic, with stable VS. Patient is informed of clinical course, understands medical decision making process, and agrees with plan. Opportunity for questions provided and all questions answered. Return precautions given.  Final Clinical Impressions(s) / ED Diagnoses   Final diagnoses:  Dental abscess  Pain, dental    New Prescriptions Discharge Medication List as of 04/08/2016  4:27 PM       Bethel BornKelly Marie Benyamin Jeff, PA-C 04/08/16 1653    Vanetta MuldersScott Zackowski, MD 04/08/16 1723

## 2016-04-08 NOTE — ED Notes (Signed)
Pt c/o 3 different teeth that are hurting.  One on upper right back. One in front on lower.  And one on left back.

## 2016-07-19 ENCOUNTER — Encounter (HOSPITAL_COMMUNITY): Payer: Self-pay | Admitting: Emergency Medicine

## 2016-07-19 DIAGNOSIS — K0889 Other specified disorders of teeth and supporting structures: Secondary | ICD-10-CM | POA: Insufficient documentation

## 2016-07-19 DIAGNOSIS — E119 Type 2 diabetes mellitus without complications: Secondary | ICD-10-CM | POA: Insufficient documentation

## 2016-07-19 DIAGNOSIS — Z79899 Other long term (current) drug therapy: Secondary | ICD-10-CM | POA: Insufficient documentation

## 2016-07-19 NOTE — ED Triage Notes (Signed)
Patient reports right upper molar pain onset this week unrelieved by OTC pain medications .

## 2016-07-20 ENCOUNTER — Emergency Department (HOSPITAL_COMMUNITY)
Admission: EM | Admit: 2016-07-20 | Discharge: 2016-07-20 | Disposition: A | Discharge status: Home / Self Care | Payer: Self-pay | Attending: Emergency Medicine | Admitting: Emergency Medicine

## 2016-07-20 DIAGNOSIS — K0889 Other specified disorders of teeth and supporting structures: Secondary | ICD-10-CM

## 2016-07-20 MED ORDER — PENICILLIN V POTASSIUM 500 MG PO TABS: 500.0000 mg | ORAL_TABLET | Freq: Four times a day (QID) | ORAL | 0 refills | Status: DC

## 2016-07-20 MED ORDER — LIDOCAINE VISCOUS 2 % MT SOLN: 15.0000 mL | OROMUCOSAL | 2 refills | Status: DC | PRN

## 2016-07-20 MED ORDER — IBUPROFEN 800 MG PO TABS: 800.0000 mg | ORAL_TABLET | Freq: Three times a day (TID) | ORAL | 0 refills | Status: DC

## 2016-07-20 NOTE — ED Provider Notes (Signed)
[facility] Provider Note   CSN: [id number] Arrival date & time: 07/19/16  2134     History   Chief Complaint Chief Complaint  Patient presents with  . Dental Pain    HPI Karina Pierce is a 61 y.o. female.  HPI    Karina Pierce is a 61 y.o. female, with a history of DM, presenting to the ED with Upper right dental pain beginning this week. Has not yet seen a dentist for this issue. Has not taken any regular OTC medications for this issue. Pain is moderate, aching, nonradiating. Denies fever/chills, nausea/vomiting, difficulty breathing or swallowing, or any other complaints.      Past Medical History:  Diagnosis Date  . Diabetes mellitus without complication (HCC)     There are no active problems to display for this patient.   Past Surgical History:  Procedure Laterality Date  . KNEE SURGERY      OB History    No data available       Home Medications    Prior to Admission medications   Medication Sig Start Date End Date Taking? Authorizing Provider  acetaminophen-codeine (TYLENOL #3) 300-30 MG tablet Take 1-2 tablets by mouth every 6 (six) hours as needed for moderate pain. 10/12/15   Trixie Dredge, PA-C  ibuprofen (ADVIL,MOTRIN) 600 MG tablet Take 1 tablet (600 mg total) by mouth 3 (three) times daily. 04/08/16   Bethel Born, PA-C  ibuprofen (ADVIL,MOTRIN) 800 MG tablet Take 1 tablet (800 mg total) by mouth 3 (three) times daily. 07/20/16   Shawn C Joy, PA-C  lidocaine (XYLOCAINE) 2 % solution Use as directed 15 mLs in the mouth or throat as needed for mouth pain. 07/20/16   Shawn C Joy, PA-C  naproxen (NAPROSYN) 500 MG tablet Take 1 tablet (500 mg total) by mouth 2 (two) times daily with a meal. 02/25/16   Garlon Hatchet, PA-C  penicillin v potassium (VEETID) 500 MG tablet Take 1 tablet (500 mg total) by mouth 4 (four) times daily. 07/20/16 07/30/16  Anselm Pancoast, PA-C    Family History No family history on file.  Social History Social History  Substance  Use Topics  . Smoking status: Former Games developer  . Smokeless tobacco: Never Used  . Alcohol use No     Allergies   Patient has no known allergies.   Review of Systems Review of Systems  Constitutional: Negative for chills and fever.  HENT: Positive for dental problem. Negative for facial swelling.   Respiratory: Negative for shortness of breath.      Physical Exam Updated Vital Signs BP 113/76 (BP Location: Left Arm)   Pulse 91   Temp 98.8 F (37.1 C) (Oral)   Resp 16   Ht 5\' 10"  (1.778 m)   Wt 100.2 kg   SpO2 100%   BMI 31.71 kg/m   Physical Exam  Constitutional: She appears well-developed and well-nourished. No distress.  HENT:  Head: Normocephalic and atraumatic.  Tenderness to right upper molar. This tooth is loose. No fracture noted. No trismus. Patient handles oral secretions without difficulty. No facial swelling noted.  Eyes: Conjunctivae are normal.  Neck: Normal range of motion. Neck supple.  Cardiovascular: Normal rate and regular rhythm.   Pulmonary/Chest: Effort normal. No respiratory distress.  Lymphadenopathy:    She has no cervical adenopathy.  Neurological: She is alert.  Skin: Skin is warm and dry. She is not diaphoretic.  Psychiatric: She has a normal mood and affect. Her behavior is normal.  Nursing note and vitals reviewed.    ED Treatments / Results  Labs (all labs ordered are listed, but only abnormal results are displayed) Labs Reviewed - No data to display  EKG  EKG Interpretation None       Radiology No results found.  Procedures Procedures (including critical care time)  Medications Ordered in ED Medications - No data to display   Initial Impression / Assessment and Plan / ED Course  I have reviewed the triage vital signs and the nursing notes.  Pertinent labs & imaging results that were available during my care of the patient were reviewed by me and considered in my medical decision making (see chart for details).       Patient presents with dental pain. No signs of sepsis or Ludwig's angioedema. Dental follow-up recommended. Home care and return precautions discussed. Patient voiced understanding of all instructions and is comfortable with discharge.  Vitals:   07/19/16 2155  BP: 113/76  Pulse: 91  Resp: 16  Temp: 98.8 F (37.1 C)  TempSrc: Oral  SpO2: 100%  Weight: 100.2 kg  Height: 5\' 10"  (1.778 m)     Final Clinical Impressions(s) / ED Diagnoses   Final diagnoses:  Pain, dental    New Prescriptions New Prescriptions   IBUPROFEN (ADVIL,MOTRIN) 800 MG TABLET    Take 1 tablet (800 mg total) by mouth 3 (three) times daily.   LIDOCAINE (XYLOCAINE) 2 % SOLUTION    Use as directed 15 mLs in the mouth or throat as needed for mouth pain.   PENICILLIN V POTASSIUM (VEETID) 500 MG TABLET    Take 1 tablet (500 mg total) by mouth 4 (four) times daily.     Anselm PancoastShawn C Joy, PA-C 07/20/16 0104    Glynn OctaveStephen Rancour, MD 07/20/16 [phone]

## 2016-07-20 NOTE — Discharge Instructions (Signed)
You have been seen today for dental pain. You should follow up with a dentist as soon as possible. This problem will not resolve on its own without the care of a dentist. Use ibuprofen or naproxen for pain. Use the viscous lidocaine for mouth pain. Swish with the lidocaine and spit it out. Do not swallow it. ° °Please take all of your antibiotics until finished!   You may develop abdominal discomfort or diarrhea from the antibiotic.  You may help offset this with probiotics which you can buy or get in yogurt. Do not eat or take the probiotics until 2 hours after your antibiotic.  ° °Dental Resource Guide ° °[facility] °[address], [address] °[place], [place] [address] °[phone] ° °[facility] °[address] °[place], [place] [address] °[phone] ° °[facility] °[address] °[place], [place] [address] °[phone] ° °[facility] °[address] °[place], [place] [address] °[phone] ° °[facility] °[address] °[place], [place] [address] °[phone] ° °[employer] °[facility]ww.[facility].edu/patientcare/studentclinics/becomepatient ° °[employer] [facility] [employer] °[address] °[place], [place] [address] °[phone] ° °Website for free, low-income, or sliding scale dental services in [place]: °www.freedental.us ° °To find a dentist in [place] and surrounding areas: °www.ncdental.org/for-the-public/find-a-dentist ° °[facility] °http://www.ncdental.org/meetings-events/-[facility] ° ° Medicaid Dentist °https://dma.ncdhhs.gov/find-a-doctor/medicaid-dental-providers ° °

## 2016-07-20 NOTE — ED Notes (Signed)
Pt understood dc material. NAD noted. 

## 2016-07-25 ENCOUNTER — Encounter (HOSPITAL_COMMUNITY): Payer: Self-pay | Admitting: Emergency Medicine

## 2016-07-25 ENCOUNTER — Emergency Department (HOSPITAL_COMMUNITY)
Admission: EM | Admit: 2016-07-25 | Discharge: 2016-07-25 | Disposition: A | Discharge status: Home / Self Care | Payer: Self-pay | Attending: Emergency Medicine | Admitting: Emergency Medicine

## 2016-07-25 DIAGNOSIS — E119 Type 2 diabetes mellitus without complications: Secondary | ICD-10-CM | POA: Insufficient documentation

## 2016-07-25 DIAGNOSIS — R1011 Right upper quadrant pain: Secondary | ICD-10-CM | POA: Insufficient documentation

## 2016-07-25 DIAGNOSIS — Z7984 Long term (current) use of oral hypoglycemic drugs: Secondary | ICD-10-CM | POA: Insufficient documentation

## 2016-07-25 DIAGNOSIS — Z87891 Personal history of nicotine dependence: Secondary | ICD-10-CM | POA: Insufficient documentation

## 2016-07-25 LAB — COMPREHENSIVE METABOLIC PANEL
ALK PHOS: 100 U/L (ref 38–126)
ALT: 14 U/L (ref 14–54)
ANION GAP: 7 (ref 5–15)
AST: 17 U/L (ref 15–41)
Albumin: 3.4 g/dL — ABNORMAL LOW (ref 3.5–5.0)
BILIRUBIN TOTAL: 0.6 mg/dL (ref 0.3–1.2)
BUN: 12 mg/dL (ref 6–20)
CALCIUM: 9.6 mg/dL (ref 8.9–10.3)
CO2: 25 mmol/L (ref 22–32)
Chloride: 102 mmol/L (ref 101–111)
Creatinine, Ser: 0.78 mg/dL (ref 0.44–1.00)
Glucose, Bld: 428 mg/dL — ABNORMAL HIGH (ref 65–99)
Potassium: 4.3 mmol/L (ref 3.5–5.1)
Sodium: 134 mmol/L — ABNORMAL LOW (ref 135–145)
TOTAL PROTEIN: 6.8 g/dL (ref 6.5–8.1)

## 2016-07-25 LAB — LIPASE, BLOOD: Lipase: 98 U/L — ABNORMAL HIGH (ref 11–51)

## 2016-07-25 LAB — URINALYSIS, ROUTINE W REFLEX MICROSCOPIC
BILIRUBIN URINE: NEGATIVE
Bacteria, UA: NONE SEEN
Glucose, UA: >=500 mg/dL — AB
HGB URINE DIPSTICK: NEGATIVE
Ketones, ur: 5 mg/dL — AB
Leukocytes, UA: NEGATIVE
NITRITE: NEGATIVE
Protein, ur: NEGATIVE mg/dL
SPECIFIC GRAVITY, URINE: 1.029 (ref 1.005–1.030)
pH: 5 (ref 5.0–8.0)

## 2016-07-25 LAB — CBC
HCT: 39 % (ref 36.0–46.0)
HEMOGLOBIN: 13.3 g/dL (ref 12.0–15.0)
MCH: 28.1 pg (ref 26.0–34.0)
MCHC: 34.1 g/dL (ref 30.0–36.0)
MCV: 82.3 fL (ref 78.0–100.0)
Platelets: 391 10*3/uL (ref 150–400)
RBC: 4.74 MIL/uL (ref 3.87–5.11)
RDW: 11.7 % (ref 11.5–15.5)
WBC: 7.9 10*3/uL (ref 4.0–10.5)

## 2016-07-25 LAB — CBG MONITORING, ED: GLUCOSE-CAPILLARY: 468 mg/dL — AB (ref 65–99)

## 2016-07-25 MED ORDER — GI COCKTAIL ~~LOC~~
30.0000 mL | Freq: Once | ORAL | Status: AC
  Administered 2016-07-25: 30 mL via ORAL
  Filled 2016-07-25: qty 30

## 2016-07-25 MED ORDER — METFORMIN HCL ER 500 MG PO TB24: 500.0000 mg | ORAL_TABLET | Freq: Every day | ORAL | 0 refills | Status: DC

## 2016-07-25 MED ORDER — METFORMIN HCL 500 MG PO TABS: 500.0000 mg | ORAL_TABLET | Freq: Two times a day (BID) | ORAL | 0 refills | Status: DC

## 2016-07-25 MED ORDER — SODIUM CHLORIDE 0.9 % IV BOLUS (SEPSIS)
1000.0000 mL | Freq: Once | INTRAVENOUS | Status: AC
  Administered 2016-07-25: 1000 mL via INTRAVENOUS

## 2016-07-25 NOTE — ED Triage Notes (Signed)
Patient arrives with 3+ weeks of RLQ and right lateral abdominal pain. Explains that she took abx for a dental infection and that alleviated her pain some, but now it has returned.

## 2016-07-25 NOTE — ED Provider Notes (Signed)
[facility]-EMERGENCY DEPT Provider Note   CSN: [id number] Arrival date & time: 07/25/16  4098     History   Chief Complaint Chief Complaint  Patient presents with  . Abdominal Pain    HPI Karina Pierce is a 61 y.o. female.  61 yo F with a chief complaint of right upper quadrant abdominal pain. This been going on for at least a month. Worse with eating and drinking. Patient is also had some diarrhea with this feels that she is having lots of rushing sounds in her abdomen. Denies any vomiting denies fevers. Patient was recently treated for a dental infection about 2 weeks ago and she felt that improved her symptoms.   The history is provided by the patient.  Abdominal Pain   This is a new problem. The current episode started more than 1 week ago (about a month). The problem occurs constantly. The problem has been gradually worsening. The pain is associated with eating. The pain is located in the RUQ. The quality of the pain is cramping and aching. The pain is at a severity of 7/10. The pain is moderate. Associated symptoms include diarrhea. Pertinent negatives include fever, nausea, vomiting, dysuria, headaches, arthralgias and myalgias. The symptoms are aggravated by certain positions, palpation and eating. Relieved by: penicillin.    Past Medical History:  Diagnosis Date  . Diabetes mellitus without complication (HCC)     There are no active problems to display for this patient.   Past Surgical History:  Procedure Laterality Date  . KNEE SURGERY      OB History    No data available       Home Medications    Prior to Admission medications   Medication Sig Start Date End Date Taking? Authorizing Provider  ibuprofen (ADVIL,MOTRIN) 200 MG tablet Take 400 mg by mouth every 6 (six) hours as needed for mild pain.   Yes Historical Provider, MD  acetaminophen-codeine (TYLENOL #3) 300-30 MG tablet Take 1-2 tablets by mouth every 6 (six) hours as needed for moderate pain. Patient not  taking: Reported on 07/25/2016 10/12/15   Trixie Dredge, PA-C  ibuprofen (ADVIL,MOTRIN) 600 MG tablet Take 1 tablet (600 mg total) by mouth 3 (three) times daily. Patient not taking: Reported on 07/25/2016 04/08/16   Bethel Born, PA-C  ibuprofen (ADVIL,MOTRIN) 800 MG tablet Take 1 tablet (800 mg total) by mouth 3 (three) times daily. Patient not taking: Reported on 07/25/2016 07/20/16   Shawn C Joy, PA-C  lidocaine (XYLOCAINE) 2 % solution Use as directed 15 mLs in the mouth or throat as needed for mouth pain. Patient not taking: Reported on 07/25/2016 07/20/16   Anselm Pancoast, PA-C  metFORMIN (GLUCOPHAGE-XR) 500 MG 24 hr tablet Take 1 tablet (500 mg total) by mouth daily with breakfast. 07/25/16   Melene Plan, DO  naproxen (NAPROSYN) 500 MG tablet Take 1 tablet (500 mg total) by mouth 2 (two) times daily with a meal. Patient not taking: Reported on 07/25/2016 02/25/16   Garlon Hatchet, PA-C    Family History History reviewed. No pertinent family history.  Social History Social History  Substance Use Topics  . Smoking status: Former Games developer  . Smokeless tobacco: Never Used  . Alcohol use No     Allergies   Patient has no known allergies.   Review of Systems Review of Systems  Constitutional: Negative for chills and fever.  HENT: Negative for congestion and rhinorrhea.   Eyes: Negative for redness and visual disturbance.  Respiratory: Negative  for shortness of breath and wheezing.   Cardiovascular: Negative for chest pain and palpitations.  Gastrointestinal: Positive for abdominal pain and diarrhea. Negative for nausea and vomiting.  Genitourinary: Negative for dysuria and urgency.  Musculoskeletal: Negative for arthralgias and myalgias.  Skin: Negative for pallor and wound.  Neurological: Negative for dizziness and headaches.     Physical Exam Updated Vital Signs BP 119/75   Pulse 74   Temp 98.4 F (36.9 C) (Oral)   Resp 16   Ht 5\' 10"  (1.778 m)   Wt 217 lb (98.4 kg)   SpO2  99%   BMI 31.14 kg/m   Physical Exam  Constitutional: She is oriented to person, place, and time. She appears well-developed and well-nourished. No distress.  HENT:  Head: Normocephalic and atraumatic.  Eyes: EOM are normal. Pupils are equal, round, and reactive to light.  Neck: Normal range of motion. Neck supple.  Cardiovascular: Normal rate and regular rhythm.  Exam reveals no gallop and no friction rub.   No murmur heard. Pulmonary/Chest: Effort normal. She has no wheezes. She has no rales.  Abdominal: Soft. She exhibits no distension and no mass. There is tenderness (diffuse mild tenderness, worse to the RUQ, Negative murphys). There is no guarding.  Musculoskeletal: She exhibits no edema or tenderness.  Neurological: She is alert and oriented to person, place, and time.  Skin: Skin is warm and dry. She is not diaphoretic.  Psychiatric: She has a normal mood and affect. Her behavior is normal.  Nursing note and vitals reviewed.    ED Treatments / Results  Labs (all labs ordered are listed, but only abnormal results are displayed) Labs Reviewed  LIPASE, BLOOD - Abnormal; Notable for the following:       Result Value   Lipase 98 (*)    All other components within normal limits  COMPREHENSIVE METABOLIC PANEL - Abnormal; Notable for the following:    Sodium 134 (*)    Glucose, Bld 428 (*)    Albumin 3.4 (*)    All other components within normal limits  URINALYSIS, ROUTINE W REFLEX MICROSCOPIC - Abnormal; Notable for the following:    Color, Urine STRAW (*)    Glucose, UA >=500 (*)    Ketones, ur 5 (*)    Squamous Epithelial / LPF 0-5 (*)    All other components within normal limits  CBG MONITORING, ED - Abnormal; Notable for the following:    Glucose-Capillary 468 (*)    All other components within normal limits  CBC    EKG  EKG Interpretation None       Radiology No results found.  Procedures Procedures (including critical care time)  Medications Ordered  in ED Medications  sodium chloride 0.9 % bolus 1,000 mL (0 mLs Intravenous Stopped 07/25/16 0850)  gi cocktail (Maalox,Lidocaine,Donnatal) (30 mLs Oral Given 07/25/16 0755)     Initial Impression / Assessment and Plan / ED Course  I have reviewed the triage vital signs and the nursing notes.  Pertinent labs & imaging results that were available during my care of the patient were reviewed by me and considered in my medical decision making (see chart for details).     61 yo F With a chief complaint of right upper quadrant abdominal pain. This been going on for greater than a month. There is a possibility that this is gallbladder pain though I doubt that this is acute cholecystitis as the patient is well-appearing afebrile and without a Murphy sign. She has  no leukocytosis. Do not feel that imaging is warranted at this time. There is also some consideration for a viral etiology as the patient is having rushing noises in her abdomen and diarrhea. I will have her follow-up with her family physician. I strongly encouraged the patient to follow-up with her PCP to be treated for her diabetes. Patient states that she has been improving mildly with diet and exercise. The blood sugar in the 400s and feel that she likely needs to be on medication.  Family arrived and is very upset that the patient isn't getting emergent imaging.  I discussed reason for them to be obtained in the hospital.  Family disagree with me, suggested they follow up with a PCP and will have them start on zantac and metformin.   9:29 AM:  I have discussed the diagnosis/risks/treatment options with the patient and family and believe the pt to be eligible for discharge home to follow-up with PCP. We also discussed returning to the ED immediately if new or worsening sx occur. We discussed the sx which are most concerning (e.g., sudden worsening pain, fever, inability to tolerate by mouth) that necessitate immediate return. Medications  administered to the patient during their visit and any new prescriptions provided to the patient are listed below.  Medications given during this visit Medications  sodium chloride 0.9 % bolus 1,000 mL (0 mLs Intravenous Stopped 07/25/16 0850)  gi cocktail (Maalox,Lidocaine,Donnatal) (30 mLs Oral Given 07/25/16 0755)     The patient appears reasonably screen and/or stabilized for discharge and I doubt any other medical condition or other [facility] requiring further screening, evaluation, or treatment in the ED at this time prior to discharge.    Final Clinical Impressions(s) / ED Diagnoses   Final diagnoses:  Right upper quadrant abdominal pain    New Prescriptions New Prescriptions   METFORMIN (GLUCOPHAGE-XR) 500 MG 24 HR TABLET    Take 1 tablet (500 mg total) by mouth daily with breakfast.     Melene Plan, DO 07/25/16 0930

## 2016-07-25 NOTE — Discharge Instructions (Signed)
Follow up with your PCP.  They may want to get you an ultrasound of your gallbladder. Try zantac 150mg  twice a day. Return for sudden worsening pain, uncontrolled vomiting or fever.

## 2016-07-28 ENCOUNTER — Emergency Department (HOSPITAL_COMMUNITY)
Admission: EM | Admit: 2016-07-28 | Discharge: 2016-07-29 | Disposition: A | Discharge status: Home / Self Care | Payer: Self-pay | Attending: Emergency Medicine | Admitting: Emergency Medicine

## 2016-07-28 ENCOUNTER — Encounter (HOSPITAL_COMMUNITY): Payer: Self-pay | Admitting: Emergency Medicine

## 2016-07-28 ENCOUNTER — Emergency Department (HOSPITAL_COMMUNITY): Payer: Self-pay

## 2016-07-28 DIAGNOSIS — R1011 Right upper quadrant pain: Secondary | ICD-10-CM

## 2016-07-28 DIAGNOSIS — Z87891 Personal history of nicotine dependence: Secondary | ICD-10-CM | POA: Insufficient documentation

## 2016-07-28 DIAGNOSIS — J069 Acute upper respiratory infection, unspecified: Secondary | ICD-10-CM | POA: Insufficient documentation

## 2016-07-28 DIAGNOSIS — Z7984 Long term (current) use of oral hypoglycemic drugs: Secondary | ICD-10-CM | POA: Insufficient documentation

## 2016-07-28 DIAGNOSIS — R109 Unspecified abdominal pain: Secondary | ICD-10-CM | POA: Insufficient documentation

## 2016-07-28 DIAGNOSIS — Z79899 Other long term (current) drug therapy: Secondary | ICD-10-CM | POA: Insufficient documentation

## 2016-07-28 DIAGNOSIS — E119 Type 2 diabetes mellitus without complications: Secondary | ICD-10-CM | POA: Insufficient documentation

## 2016-07-28 LAB — COMPREHENSIVE METABOLIC PANEL
ALBUMIN: 3.4 g/dL — AB (ref 3.5–5.0)
ALT: 10 U/L — ABNORMAL LOW (ref 14–54)
ANION GAP: 10 (ref 5–15)
AST: 22 U/L (ref 15–41)
Alkaline Phosphatase: 112 U/L (ref 38–126)
BILIRUBIN TOTAL: 1 mg/dL (ref 0.3–1.2)
BUN: 12 mg/dL (ref 6–20)
CO2: 26 mmol/L (ref 22–32)
Calcium: 9.2 mg/dL (ref 8.9–10.3)
Chloride: 99 mmol/L — ABNORMAL LOW (ref 101–111)
Creatinine, Ser: 0.95 mg/dL (ref 0.44–1.00)
GFR calc Af Amer: >60 mL/min (ref >60)
GLUCOSE: 332 mg/dL — AB (ref 65–99)
POTASSIUM: 4 mmol/L (ref 3.5–5.1)
Sodium: 135 mmol/L (ref 135–145)
TOTAL PROTEIN: 7.3 g/dL (ref 6.5–8.1)

## 2016-07-28 LAB — CBC WITH DIFFERENTIAL/PLATELET
BASOS ABS: 0 10*3/uL (ref 0.0–0.1)
Basophils Relative: 0 %
Eosinophils Absolute: 0.1 10*3/uL (ref 0.0–0.7)
Eosinophils Relative: 1 %
HEMATOCRIT: 40.6 % (ref 36.0–46.0)
HEMOGLOBIN: 13.7 g/dL (ref 12.0–15.0)
LYMPHS PCT: 11 %
Lymphs Abs: 0.9 10*3/uL (ref 0.7–4.0)
MCH: 27.8 pg (ref 26.0–34.0)
MCHC: 33.7 g/dL (ref 30.0–36.0)
MCV: 82.5 fL (ref 78.0–100.0)
Monocytes Absolute: 1 10*3/uL (ref 0.1–1.0)
Monocytes Relative: 12 %
NEUTROS ABS: 6.2 10*3/uL (ref 1.7–7.7)
NEUTROS PCT: 76 %
Platelets: 376 10*3/uL (ref 150–400)
RBC: 4.92 MIL/uL (ref 3.87–5.11)
RDW: 12.1 % (ref 11.5–15.5)
WBC: 8.2 10*3/uL (ref 4.0–10.5)

## 2016-07-28 LAB — I-STAT CG4 LACTIC ACID, ED
LACTIC ACID, VENOUS: 1.4 mmol/L (ref 0.5–1.9)
Lactic Acid, Venous: 1.09 mmol/L (ref 0.5–1.9)

## 2016-07-28 LAB — CBG MONITORING, ED: Glucose-Capillary: 330 mg/dL — ABNORMAL HIGH (ref 65–99)

## 2016-07-28 MED ORDER — SODIUM CHLORIDE 0.9 % IV BOLUS (SEPSIS)
1000.0000 mL | Freq: Once | INTRAVENOUS | Status: AC
  Administered 2016-07-28: 1000 mL via INTRAVENOUS

## 2016-07-28 MED ORDER — IBUPROFEN 200 MG PO TABS
600.0000 mg | ORAL_TABLET | Freq: Once | ORAL | Status: AC
  Administered 2016-07-28: 600 mg via ORAL
  Filled 2016-07-28: qty 3

## 2016-07-28 MED ORDER — ACETAMINOPHEN 325 MG PO TABS
650.0000 mg | ORAL_TABLET | Freq: Once | ORAL | Status: AC
  Administered 2016-07-28: 650 mg via ORAL
  Filled 2016-07-28: qty 2

## 2016-07-28 NOTE — ED Triage Notes (Signed)
Patient reports productive cough, headache, abdominal pain, and diarrhea x2 days. Patient states she last took tylenol around 16:30 today.

## 2016-07-28 NOTE — ED Provider Notes (Signed)
[facility]-EMERGENCY DEPT Provider Note   CSN: [id number] Arrival date & time: 07/28/16  1843 By signing my name below, I, Levon Hedger, attest that this documentation has been prepared under the direction and in the presence of non-physician practitioner, Melvenia Beam A Javayah Magaw PA-C. Electronically Signed: Levon Hedger, Scribe. 07/28/2016. 10:51 PM.   History   Chief Complaint Chief Complaint  Patient presents with  . Flu like symptoms   HPI Karina Pierce is a 61 y.o. female with a PMHx of DM who presents to the Emergency Department complaining of headache onset a few days ago. Pt notes associated room-spinning dizziness, RUQ pain, fever (tmax 101.9), 1 episode of loose stool, rhinorrhea, nonproductive cough, sore throat, and chest pain secondary to cough. Pt describes her abdominal pain as constant, moderate pain that is exacerbated by eating and with movement. No alleviating factors noted. Pt has been on penicillin for 5 days for a tooth abscess. Pt denies any tobacco or drug use. She denies any nausea, vomiting, congestion, SOB, or urinary symptoms.   The history is provided by the patient. No language interpreter was used.    Past Medical History:  Diagnosis Date  . Diabetes mellitus without complication (HCC)     There are no active problems to display for this patient.   Past Surgical History:  Procedure Laterality Date  . KNEE SURGERY      OB History    No data available      Home Medications    Prior to Admission medications   Medication Sig Start Date End Date Taking? Authorizing Provider  acetaminophen-codeine (TYLENOL #3) 300-30 MG tablet Take 1-2 tablets by mouth every 6 (six) hours as needed for moderate pain. Patient not taking: Reported on 07/25/2016 10/12/15   Trixie Dredge, PA-C  ibuprofen (ADVIL,MOTRIN) 200 MG tablet Take 400 mg by mouth every 6 (six) hours as needed for mild pain.    Historical Provider, MD  ibuprofen (ADVIL,MOTRIN) 600 MG tablet Take 1 tablet (600 mg  total) by mouth 3 (three) times daily. Patient not taking: Reported on 07/25/2016 04/08/16   Bethel Born, PA-C  ibuprofen (ADVIL,MOTRIN) 800 MG tablet Take 1 tablet (800 mg total) by mouth 3 (three) times daily. Patient not taking: Reported on 07/25/2016 07/20/16   Shawn C Joy, PA-C  lidocaine (XYLOCAINE) 2 % solution Use as directed 15 mLs in the mouth or throat as needed for mouth pain. Patient not taking: Reported on 07/25/2016 07/20/16   Anselm Pancoast, PA-C  metFORMIN (GLUCOPHAGE-XR) 500 MG 24 hr tablet Take 1 tablet (500 mg total) by mouth daily with breakfast. 07/25/16   Melene Plan, DO  naproxen (NAPROSYN) 500 MG tablet Take 1 tablet (500 mg total) by mouth 2 (two) times daily with a meal. Patient not taking: Reported on 07/25/2016 02/25/16   Garlon Hatchet, PA-C    Family History No family history on file.  Social History Social History  Substance Use Topics  . Smoking status: Former Games developer  . Smokeless tobacco: Never Used  . Alcohol use No    Allergies   Patient has no known allergies.  Review of Systems Review of Systems  Constitutional: Positive for fever.  HENT: Positive for rhinorrhea and sore throat. Negative for congestion.   Respiratory: Positive for cough.   Cardiovascular: Positive for chest pain.  Gastrointestinal: Positive for abdominal pain. Negative for nausea and vomiting.  Genitourinary: Negative.   Neurological: Positive for dizziness and headaches.    Physical Exam Updated Vital Signs BP 116/70 (  BP Location: Left Arm)   Pulse 120   Temp 100.5 F (38.1 C) (Oral)   Resp 18   Ht 5\' 10"  (1.778 m)   Wt 217 lb (98.4 kg)   SpO2 98%   BMI 31.14 kg/m   Physical Exam  Constitutional: She is oriented to person, place, and time. She appears well-developed and well-nourished. No distress.  HENT:  Head: Normocephalic and atraumatic.  Mouth/Throat: Oropharynx is clear and moist.  Nose normal, no edema.   Eyes: Conjunctivae are normal.  Cardiovascular:  Normal rate and regular rhythm.   Pulmonary/Chest: Effort normal.  Lungs clear  Abdominal: She exhibits no distension. There is tenderness. There is negative Murphy's sign.  Tenderness to the right lateral and upper abdominal wall. Negative Murphy's sign. Non tender abdomen otherwise.   Lymphadenopathy:    She has no cervical adenopathy.  Neurological: She is alert and oriented to person, place, and time.  Skin: Skin is warm and dry.  Psychiatric: She has a normal mood and affect.  Nursing note and vitals reviewed.  ED Treatments / Results  DIAGNOSTIC STUDIES:  Oxygen Saturation is 98% on RA, normal by my interpretation.    COORDINATION OF CARE:  10:33 PM Discussed treatment plan with pt at bedside and pt agreed to plan.   Labs (all labs ordered are listed, but only abnormal results are displayed) Labs Reviewed  COMPREHENSIVE METABOLIC PANEL - Abnormal; Notable for the following:       Result Value   Chloride 99 (*)    Glucose, Bld 332 (*)    Albumin 3.4 (*)    ALT 10 (*)    All other components within normal limits  CBC WITH DIFFERENTIAL/PLATELET  I-STAT CG4 LACTIC ACID, ED  I-STAT CG4 LACTIC ACID, ED   Results for orders placed or performed during the hospital encounter of 07/28/16  Comprehensive metabolic panel  Result Value Ref Range   Sodium 135 135 - 145 mmol/L   Potassium 4.0 3.5 - 5.1 mmol/L   Chloride 99 (L) 101 - 111 mmol/L   CO2 26 22 - 32 mmol/L   Glucose, Bld 332 (H) 65 - 99 mg/dL   BUN 12 6 - 20 mg/dL   Creatinine, Ser 1.61 0.44 - 1.00 mg/dL   Calcium 9.2 8.9 - 09.6 mg/dL   Total Protein 7.3 6.5 - 8.1 g/dL   Albumin 3.4 (L) 3.5 - 5.0 g/dL   AST 22 15 - 41 U/L   ALT 10 (L) 14 - 54 U/L   Alkaline Phosphatase 112 38 - 126 U/L   Total Bilirubin 1.0 0.3 - 1.2 mg/dL   GFR calc non Af Amer >60 >60 mL/min   GFR calc Af Amer >60 >60 mL/min   Anion gap 10 5 - 15  CBC with Differential  Result Value Ref Range   WBC 8.2 4.0 - 10.5 K/uL   RBC 4.92 3.87 -  5.11 MIL/uL   Hemoglobin 13.7 12.0 - 15.0 g/dL   HCT 04.5 40.9 - 81.1 %   MCV 82.5 78.0 - 100.0 fL   MCH 27.8 26.0 - 34.0 pg   MCHC 33.7 30.0 - 36.0 g/dL   RDW 91.4 78.2 - 95.6 %   Platelets 376 150 - 400 K/uL   Neutrophils Relative % 76 %   Neutro Abs 6.2 1.7 - 7.7 K/uL   Lymphocytes Relative 11 %   Lymphs Abs 0.9 0.7 - 4.0 K/uL   Monocytes Relative 12 %   Monocytes Absolute 1.0 0.1 -  1.0 K/uL   Eosinophils Relative 1 %   Eosinophils Absolute 0.1 0.0 - 0.7 K/uL   Basophils Relative 0 %   Basophils Absolute 0.0 0.0 - 0.1 K/uL  I-Stat CG4 Lactic Acid, ED  Result Value Ref Range   Lactic Acid, Venous 1.40 0.5 - 1.9 mmol/L  I-Stat CG4 Lactic Acid, ED  Result Value Ref Range   Lactic Acid, Venous 1.09 0.5 - 1.9 mmol/L  CBG monitoring, ED  Result Value Ref Range   Glucose-Capillary 330 (H) 65 - 99 mg/dL    EKG  EKG Interpretation None       Radiology No results found. Dg Chest 2 View  Result Date: 07/29/2016 CLINICAL DATA:  Acute onset of cough, fever and diarrhea. Initial encounter. EXAM: CHEST  2 VIEW COMPARISON:  None. FINDINGS: The lungs are well-aerated and clear. There is no evidence of focal opacification, pleural effusion or pneumothorax. The heart is normal in size; the mediastinal contour is within normal limits. No acute osseous abnormalities are seen. IMPRESSION: No acute cardiopulmonary process seen. Electronically Signed   By: Roanna RaiderJeffery  Chang M.D.   On: 07/29/2016 01:13   [place] Abdomen Limited Ruq  Result Date: 07/29/2016 CLINICAL DATA:  RIGHT upper quadrant tenderness for a few days. EXAM: US ABDOMEN LIMITED - RIGHT UPPER QUADRANT COMPARISON:  None. FINDINGS: Gallbladder: No gallstones or wall thickening visualized. No sonographic Murphy sign noted by sonographer. Common bile duct: Diameter: 4 mm Liver: No focal lesion identified. Within normal limits in parenchymal echogenicity. Hepatopetal portal vein. IMPRESSION: Normal RIGHT upper quadrant ultrasound.  Electronically Signed   By: Awilda Metroourtnay  Bloomer M.D.   On: 07/29/2016 00:33    Procedures Procedures (including critical care time)  Medications Ordered in ED Medications  ibuprofen (ADVIL,MOTRIN) tablet 600 mg (600 mg Oral Given 07/28/16 2055)     Initial Impression / Assessment and Plan / ED Course  I have reviewed the triage vital signs and the nursing notes.  Pertinent labs & imaging results that were available during my care of the patient were reviewed by me and considered in my medical decision making (see chart for details).     Patient presents with symptoms of URI, right upper abdominal pain, loose stool and dizziness. She is very well appearing, hydrated, clear lungs. CXR clear, labs are essentially negative with exception of mild hyperglycemia without acidosis.   She can be discharged home with supportive care for URI and treatment for what is likely muscular lateral abdominal wall pain. VSS.   Final Clinical Impressions(s) / ED Diagnoses   Final diagnoses:  None   1. URI 2. Abdominal wall pain   New Prescriptions New Prescriptions   No medications on file  I personally performed the services described in this documentation, which was scribed in my presence. The recorded information has been reviewed and is accurate.      Elpidio AnisShari Eneida Evers, PA-C 07/29/16 0151    Gilda Creasehristopher J Pollina, MD 07/29/16 [phone]

## 2016-07-29 ENCOUNTER — Emergency Department (HOSPITAL_COMMUNITY): Payer: Self-pay

## 2016-07-29 MED ORDER — IBUPROFEN 600 MG PO TABS: 600.0000 mg | ORAL_TABLET | Freq: Four times a day (QID) | ORAL | 0 refills | Status: DC | PRN

## 2016-11-20 ENCOUNTER — Encounter (HOSPITAL_COMMUNITY): Payer: Self-pay | Admitting: *Deleted

## 2016-11-20 ENCOUNTER — Emergency Department (HOSPITAL_COMMUNITY): Payer: Self-pay

## 2016-11-20 ENCOUNTER — Emergency Department (HOSPITAL_COMMUNITY)
Admission: EM | Admit: 2016-11-20 | Discharge: 2016-11-20 | Disposition: A | Discharge status: Home / Self Care | Payer: Self-pay | Attending: Emergency Medicine | Admitting: Emergency Medicine

## 2016-11-20 DIAGNOSIS — Y92009 Unspecified place in unspecified non-institutional (private) residence as the place of occurrence of the external cause: Secondary | ICD-10-CM | POA: Insufficient documentation

## 2016-11-20 DIAGNOSIS — W01198A Fall on same level from slipping, tripping and stumbling with subsequent striking against other object, initial encounter: Secondary | ICD-10-CM | POA: Insufficient documentation

## 2016-11-20 DIAGNOSIS — R739 Hyperglycemia, unspecified: Secondary | ICD-10-CM | POA: Insufficient documentation

## 2016-11-20 DIAGNOSIS — Y9389 Activity, other specified: Secondary | ICD-10-CM | POA: Insufficient documentation

## 2016-11-20 DIAGNOSIS — R402413 Glasgow coma scale score 13-15, at hospital admission: Secondary | ICD-10-CM | POA: Insufficient documentation

## 2016-11-20 DIAGNOSIS — W19XXXA Unspecified fall, initial encounter: Secondary | ICD-10-CM

## 2016-11-20 DIAGNOSIS — M5442 Lumbago with sciatica, left side: Secondary | ICD-10-CM | POA: Insufficient documentation

## 2016-11-20 DIAGNOSIS — Y990 Civilian activity done for income or pay: Secondary | ICD-10-CM | POA: Insufficient documentation

## 2016-11-20 DIAGNOSIS — R935 Abnormal findings on diagnostic imaging of other abdominal regions, including retroperitoneum: Secondary | ICD-10-CM | POA: Insufficient documentation

## 2016-11-20 DIAGNOSIS — S40022A Contusion of left upper arm, initial encounter: Secondary | ICD-10-CM | POA: Insufficient documentation

## 2016-11-20 DIAGNOSIS — S8012XA Contusion of left lower leg, initial encounter: Secondary | ICD-10-CM | POA: Insufficient documentation

## 2016-11-20 DIAGNOSIS — S8011XA Contusion of right lower leg, initial encounter: Secondary | ICD-10-CM | POA: Insufficient documentation

## 2016-11-20 DIAGNOSIS — S0990XA Unspecified injury of head, initial encounter: Secondary | ICD-10-CM | POA: Insufficient documentation

## 2016-11-20 DIAGNOSIS — M533 Sacrococcygeal disorders, not elsewhere classified: Secondary | ICD-10-CM | POA: Insufficient documentation

## 2016-11-20 DIAGNOSIS — S40021A Contusion of right upper arm, initial encounter: Secondary | ICD-10-CM | POA: Insufficient documentation

## 2016-11-20 LAB — BASIC METABOLIC PANEL
Anion gap: 13 (ref 5–15)
BUN: 11 mg/dL (ref 6–20)
CALCIUM: 9.6 mg/dL (ref 8.9–10.3)
CHLORIDE: 102 mmol/L (ref 101–111)
CO2: 20 mmol/L — ABNORMAL LOW (ref 22–32)
Creatinine, Ser: 0.91 mg/dL (ref 0.44–1.00)
Glucose, Bld: 263 mg/dL — ABNORMAL HIGH (ref 65–99)
Potassium: 3.8 mmol/L (ref 3.5–5.1)
SODIUM: 135 mmol/L (ref 135–145)

## 2016-11-20 LAB — CBC WITH DIFFERENTIAL/PLATELET
Basophils Absolute: 0 10*3/uL (ref 0.0–0.1)
Basophils Relative: 0 %
EOS ABS: 0 10*3/uL (ref 0.0–0.7)
Eosinophils Relative: 0 %
HCT: 40.1 % (ref 36.0–46.0)
Hemoglobin: 13.6 g/dL (ref 12.0–15.0)
LYMPHS ABS: 3.1 10*3/uL (ref 0.7–4.0)
LYMPHS PCT: 34 %
MCH: 28 pg (ref 26.0–34.0)
MCHC: 33.9 g/dL (ref 30.0–36.0)
MCV: 82.7 fL (ref 78.0–100.0)
MONO ABS: 0.9 10*3/uL (ref 0.1–1.0)
Monocytes Relative: 10 %
NEUTROS PCT: 56 %
Neutro Abs: 5.1 10*3/uL (ref 1.7–7.7)
PLATELETS: 377 10*3/uL (ref 150–400)
RBC: 4.85 MIL/uL (ref 3.87–5.11)
RDW: 12.1 % (ref 11.5–15.5)
WBC: 9.2 10*3/uL (ref 4.0–10.5)

## 2016-11-20 LAB — URINALYSIS, ROUTINE W REFLEX MICROSCOPIC
BILIRUBIN URINE: NEGATIVE
Glucose, UA: >=500 mg/dL — AB
HGB URINE DIPSTICK: NEGATIVE
Ketones, ur: 80 mg/dL — AB
LEUKOCYTES UA: NEGATIVE
Nitrite: NEGATIVE
PROTEIN: NEGATIVE mg/dL
SPECIFIC GRAVITY, URINE: 1.005 (ref 1.005–1.030)
pH: 5 (ref 5.0–8.0)

## 2016-11-20 MED ORDER — FENTANYL CITRATE (PF) 100 MCG/2ML IJ SOLN
100.0000 ug | Freq: Once | INTRAMUSCULAR | Status: AC
  Administered 2016-11-20: 100 ug via INTRAVENOUS
  Filled 2016-11-20: qty 2

## 2016-11-20 MED ORDER — SODIUM CHLORIDE 0.9 % IV BOLUS (SEPSIS)
2000.0000 mL | Freq: Once | INTRAVENOUS | Status: AC
  Administered 2016-11-20: 2000 mL via INTRAVENOUS

## 2016-11-20 MED ORDER — HYDROCODONE-ACETAMINOPHEN 5-325 MG PO TABS: 1.0000 | ORAL_TABLET | Freq: Four times a day (QID) | ORAL | 0 refills | Status: DC | PRN

## 2016-11-20 MED ORDER — IOPAMIDOL (ISOVUE-300) INJECTION 61%
INTRAVENOUS | Status: AC
  Filled 2016-11-20: qty 100

## 2016-11-20 NOTE — ED Triage Notes (Addendum)
To ED for eval after falling at work this am. 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 States she tripped over a chair falling backwards and hitting her head. No vomiting. This happened at 0630 this am. Denies feeling dizzy prior to falling. Unwitnessed fall. Pt with loc but does think her loc was long. GCS 15 at this time. Pain is in lower back. No neck pain. No obvious injury to pts head. Pupils are equal and react to light. Low blood pressure noted in triage.

## 2016-11-20 NOTE — ED Notes (Signed)
Patient transported to CT 

## 2016-11-20 NOTE — Discharge Instructions (Signed)
Take Tylenol for mild pain or the pain medicine prescribed for bad pain. Don't take Tylenol together with the pain medicine prescribed as the accommodation can be dangerous to your liver. CAT scan today showed arthritis , which did not occur as a result of the fall but no internal bleeding. Blood sugar was mildly elevated 263. See your primary care physician if having significant pain by next week or you can call any of the numbers on these instructions to get a primary care physician locally

## 2016-11-20 NOTE — ED Provider Notes (Addendum)
[facility]-EMERGENCY DEPT Provider Note   CSN: [id number] Arrival date & time: 11/20/16  1203     History   Chief Complaint Chief Complaint  Patient presents with  . Back Pain  . Fall    HPI Karina Pierce is a 61 y.o. female.Patient tripped over a chair and fell landing on her tailbone and then falling back and striking her head 6:30 AM today. She felt well prior to the event. She complains of back pain headache and neck pain as result of the fall. She also complains of lightheadedness is which is worse with standing. Pain is severe worse with changing positions lightheadedness worse with standing improve with lying supine. No treatment prior to coming here  HPI  Past Medical History:  Diagnosis Date  . Diabetes mellitus without complication (HCC)     There are no active problems to display for this patient.   Past Surgical History:  Procedure Laterality Date  . KNEE SURGERY      OB History    No data available       Home Medications    Prior to Admission medications   Medication Sig Start Date End Date Taking? Authorizing Provider  acetaminophen-codeine (TYLENOL #3) 300-30 MG tablet Take 1-2 tablets by mouth every 6 (six) hours as needed for moderate pain. Patient not taking: Reported on 07/25/2016 10/12/15   Trixie Dredge, PA-C  ibuprofen (ADVIL,MOTRIN) 600 MG tablet Take 1 tablet (600 mg total) by mouth every 6 (six) hours as needed. 07/29/16   Elpidio Anis, PA-C  lidocaine (XYLOCAINE) 2 % solution Use as directed 15 mLs in the mouth or throat as needed for mouth pain. Patient not taking: Reported on 07/25/2016 07/20/16   Anselm Pancoast, PA-C  metFORMIN (GLUCOPHAGE-XR) 500 MG 24 hr tablet Take 1 tablet (500 mg total) by mouth daily with breakfast. 07/25/16   Melene Plan, DO  naproxen (NAPROSYN) 500 MG tablet Take 1 tablet (500 mg total) by mouth 2 (two) times daily with a meal. Patient not taking: Reported on 07/25/2016 02/25/16   Garlon Hatchet, PA-C  penicillin v potassium  (VEETID) 500 MG tablet Take 500 mg by mouth 4 (four) times daily.    [provider]    Family History No family history on file.  Social History Social History  Substance Use Topics  . Smoking status: Former Games developer  . Smokeless tobacco: Never Used  . Alcohol use No   No illicit drug use  Allergies   Patient has no known allergies.   Review of Systems Review of Systems  Constitutional: Negative.   HENT: Negative.   Respiratory: Negative.   Cardiovascular: Negative.   Gastrointestinal: Negative.   Musculoskeletal: Positive for back pain and neck pain.  Skin: Negative.   Allergic/Immunologic: Positive for immunocompromised state.       Diabetic  Neurological: Positive for light-headedness and headaches.  Psychiatric/Behavioral: Negative.   All other systems reviewed and are negative.    Physical Exam Updated Vital Signs BP 95/68 (BP Location: Right Arm)   Pulse 90   Temp 99 F (37.2 C) (Oral)   Resp (!) 22   SpO2 100%   Physical Exam  Constitutional: She is oriented to person, place, and time. She appears well-developed and well-nourished. No distress.   Alert Glascow Coma  15  HENT:  Head: Normocephalic and atraumatic.  Eyes: Conjunctivae are normal. Pupils are equal, round, and reactive to light.  Neck: Neck supple. No tracheal deviation present. No thyromegaly present.  Cardiovascular:  Normal rate, regular rhythm and normal heart sounds.   No murmur heard. Pulmonary/Chest: Effort normal and breath sounds normal.  Abdominal: Soft. Bowel sounds are normal. She exhibits no distension. There is no tenderness.  Musculoskeletal: Normal range of motion. She exhibits no edema or tenderness.  Tender over cervical spine. Nontender over thoracic or lumbar spine. She has left-sided paralumbar tenderness pelvis stable nontender. All 4 extremities or contusion abrasion or tenderness neurovascularly intact.  Neurological: She is alert and oriented to person,  place, and time. No cranial nerve deficit. Coordination normal.  Skin: Skin is warm and dry. No rash noted.  Psychiatric: She has a normal mood and affect.  Nursing note and vitals reviewed.    ED Treatments / Results  Labs (all labs ordered are listed, but only abnormal results are displayed) Labs Reviewed  URINALYSIS, ROUTINE W REFLEX MICROSCOPIC  BASIC METABOLIC PANEL  CBC WITH DIFFERENTIAL/PLATELET   11/20/2016 EKG  EKG Interpretation  Date/Time:  Thursday Nov 20 2016 12:24:54 EDT Ventricular Rate:  105 PR Interval:  136 QRS Duration: 80 QT Interval:  336 QTC Calculation: 444 R Axis:   51 Text Interpretation:  Sinus tachycardia Nonspecific T wave abnormality Abnormal ECG No old tracing to compare Confirmed by Doug Sou [phone]) on 11/20/2016 1:19:02 PM      Results for orders placed or performed during the hospital encounter of 11/20/16  Urinalysis, Routine w reflex microscopic  Result Value Ref Range   Color, Urine YELLOW YELLOW   APPearance HAZY (A) CLEAR   Specific Gravity, Urine 1.005 1.005 - 1.030   pH 5.0 5.0 - 8.0   Glucose, UA >=500 (A) NEGATIVE mg/dL   Hgb urine dipstick NEGATIVE NEGATIVE   Bilirubin Urine NEGATIVE NEGATIVE   Ketones, ur 80 (A) NEGATIVE mg/dL   Protein, ur NEGATIVE NEGATIVE mg/dL   Nitrite NEGATIVE NEGATIVE   Leukocytes, UA NEGATIVE NEGATIVE   RBC / HPF 0-5 0 - 5 RBC/hpf   WBC, UA 0-5 0 - 5 WBC/hpf   Bacteria, UA RARE (A) NONE SEEN   Squamous Epithelial / LPF 0-5 (A) NONE SEEN   Mucous PRESENT    Hyaline Casts, UA PRESENT   Basic metabolic panel  Result Value Ref Range   Sodium 135 135 - 145 mmol/L   Potassium 3.8 3.5 - 5.1 mmol/L   Chloride 102 101 - 111 mmol/L   CO2 20 (L) 22 - 32 mmol/L   Glucose, Bld 263 (H) 65 - 99 mg/dL   BUN 11 6 - 20 mg/dL   Creatinine, Ser 1.91 0.44 - 1.00 mg/dL   Calcium 9.6 8.9 - 47.8 mg/dL   GFR calc non Af Amer >60 >60 mL/min   GFR calc Af Amer >60 >60 mL/min   Anion gap 13 5 - 15  CBC with  Differential/Platelet  Result Value Ref Range   WBC 9.2 4.0 - 10.5 K/uL   RBC 4.85 3.87 - 5.11 MIL/uL   Hemoglobin 13.6 12.0 - 15.0 g/dL   HCT 29.5 62.1 - 30.8 %   MCV 82.7 78.0 - 100.0 fL   MCH 28.0 26.0 - 34.0 pg   MCHC 33.9 30.0 - 36.0 g/dL   RDW 65.7 84.6 - 96.2 %   Platelets 377 150 - 400 K/uL   Neutrophils Relative % 56 %   Neutro Abs 5.1 1.7 - 7.7 K/uL   Lymphocytes Relative 34 %   Lymphs Abs 3.1 0.7 - 4.0 K/uL   Monocytes Relative 10 %   Monocytes Absolute 0.9  0.1 - 1.0 K/uL   Eosinophils Relative 0 %   Eosinophils Absolute 0.0 0.0 - 0.7 K/uL   Basophils Relative 0 %   Basophils Absolute 0.0 0.0 - 0.1 K/uL   Ct Head Wo Contrast  Result Date: 11/20/2016 CLINICAL DATA:  Posterior neck pain after a syncopal episode at work resulting in a fall backwards onto the floor today. EXAM: CT HEAD WITHOUT CONTRAST CT CERVICAL SPINE WITHOUT CONTRAST TECHNIQUE: Multidetector CT imaging of the head and cervical spine was performed following the standard protocol without intravenous contrast. Multiplanar CT image reconstructions of the cervical spine were also generated. COMPARISON:  None. FINDINGS: CT HEAD FINDINGS Brain: Diffusely enlarged ventricles and subarachnoid spaces. No intracranial hemorrhage, mass lesion or CT evidence of acute infarction. Vascular: No hyperdense vessel or unexpected calcification. Skull: Normal. Negative for fracture or focal lesion. Sinuses/Orbits: Unremarkable. Other: None. CT CERVICAL SPINE FINDINGS Alignment: Minimal reversal of the normal lordosis in the mid to upper cervical spine. Minimal retrolisthesis at the C3-4 and C4-5 levels. Skull base and vertebrae: Mild patchy sclerosis in the C2-3 C5 vertebral bodies with small Schmorl's nodes on both sides of the C2-3 disc space. No fractures. Soft tissues and spinal canal: No prevertebral fluid or swelling. No visible canal hematoma. Disc levels: Small Schmorl's nodes and minimal bilateral uncinate spur formation at the  C2-3 level. Minimal anterior and mild posterior spur formation at the C3-4 level. Mild anterior and minimal posterior spur formation at the C4-5 level. Upper chest: Clear lung apices. Other: Mild left and minimal right carotid artery calcification. IMPRESSION: 1. No skull fracture or intracranial hemorrhage. 2. No cervical spine fracture or traumatic subluxation. 3. Mild multilevel cervical spine degenerative changes. 4. Minimal diffuse cerebral and cerebellar atrophy, appropriate for age. 5. Mild left and minimal right carotid artery atheromatous calcification. Electronically Signed   By: Beckie SaltsSteven  Reid M.D.   On: 11/20/2016 15:37   Ct Cervical Spine Wo Contrast  Result Date: 11/20/2016 CLINICAL DATA:  Posterior neck pain after a syncopal episode at work resulting in a fall backwards onto the floor today. EXAM: CT HEAD WITHOUT CONTRAST CT CERVICAL SPINE WITHOUT CONTRAST TECHNIQUE: Multidetector CT imaging of the head and cervical spine was performed following the standard protocol without intravenous contrast. Multiplanar CT image reconstructions of the cervical spine were also generated. COMPARISON:  None. FINDINGS: CT HEAD FINDINGS Brain: Diffusely enlarged ventricles and subarachnoid spaces. No intracranial hemorrhage, mass lesion or CT evidence of acute infarction. Vascular: No hyperdense vessel or unexpected calcification. Skull: Normal. Negative for fracture or focal lesion. Sinuses/Orbits: Unremarkable. Other: None. CT CERVICAL SPINE FINDINGS Alignment: Minimal reversal of the normal lordosis in the mid to upper cervical spine. Minimal retrolisthesis at the C3-4 and C4-5 levels. Skull base and vertebrae: Mild patchy sclerosis in the C2-3 C5 vertebral bodies with small Schmorl's nodes on both sides of the C2-3 disc space. No fractures. Soft tissues and spinal canal: No prevertebral fluid or swelling. No visible canal hematoma. Disc levels: Small Schmorl's nodes and minimal bilateral uncinate spur formation  at the C2-3 level. Minimal anterior and mild posterior spur formation at the C3-4 level. Mild anterior and minimal posterior spur formation at the C4-5 level. Upper chest: Clear lung apices. Other: Mild left and minimal right carotid artery calcification. IMPRESSION: 1. No skull fracture or intracranial hemorrhage. 2. No cervical spine fracture or traumatic subluxation. 3. Mild multilevel cervical spine degenerative changes. 4. Minimal diffuse cerebral and cerebellar atrophy, appropriate for age. 5. Mild left and minimal right carotid  artery atheromatous calcification. Electronically Signed   By: Beckie Salts M.D.   On: 11/20/2016 15:37   Ct Abdomen Pelvis W Contrast  Result Date: 11/20/2016 CLINICAL DATA:  Coccyx pain and left sciatica following a syncopal episode resulting in a fall backwards onto the floor at work. EXAM: CT ABDOMEN AND PELVIS WITH CONTRAST TECHNIQUE: Multidetector CT imaging of the abdomen and pelvis was performed using the standard protocol following bolus administration of intravenous contrast. CONTRAST:  100 cc Isovue-300 COMPARISON:  Right upper quadrant abdomen ultrasound dated 07/29/2016. FINDINGS: Lower chest: No acute abnormality. Hepatobiliary: No focal liver abnormality is seen. No gallstones, gallbladder wall thickening, or biliary dilatation. Pancreas: Unremarkable. No pancreatic ductal dilatation or surrounding inflammatory changes. Spleen: Normal in size without focal abnormality. Adrenals/Urinary Tract: Adrenal glands are unremarkable. Kidneys are normal, without renal calculi, focal lesion, or hydronephrosis. Bladder is unremarkable. Stomach/Bowel: Stomach is within normal limits. Appendix appears normal. No evidence of bowel wall thickening, distention, or inflammatory changes. Vascular/Lymphatic: Atheromatous arterial calcifications without aneurysm. No enlarged lymph nodes. Reproductive: Heterogeneous, oval, partially exophytic posterior uterine mass. This is poorly  defined, measuring approximately 3.3 x 2.5 cm on image number 86 series 16. Normal appearing ovaries. Other: Tiny umbilical hernia containing fat. Musculoskeletal: Lower thoracic spine degenerative changes and mild lumbar spine degenerative changes. No a sacrococcygeal fracture or subluxation seen. No fractures elsewhere in the abdomen or pelvis. IMPRESSION: 1. No acute abnormality. 2. Approximately 3 cm posterior uterine fibroid on the left. Electronically Signed   By: Beckie Salts M.D.   On: 11/20/2016 15:30   Radiology No results found.  Procedures Procedures (including critical care time)  Medications Ordered in ED Medications  sodium chloride 0.9 % bolus 2,000 mL (2,000 mLs Intravenous New Bag/Given 11/20/16 1319)  fentaNYL (SUBLIMAZE) injection 100 mcg (100 mcg Intravenous Given 11/20/16 1319)     Initial Impression / Assessment and Plan / ED Course  I have reviewed the triage vital signs and the nursing notes.  Pertinent labs & imaging results that were available during my care of the patient were reviewed by me and considered in my medical decision making (see chart for details).     1:45 PM pain improved after treatment with intravenous fentanyl. She is alert Glasgow Coma Score 15 4:45 PM she feels ready to after treatment with intravenous opioids. Not lightheaded on standing She is alert and ambulates without difficulty. Plan prescription Norco. Patient not felt to be in DKA. Has a normal anion gap. Plan prescription [facility] Controlled Substance reporting System queried Final Clinical Impressions(s) / ED Diagnoses  Diagnosis #1 fall #2 contusions multiple sites #3 minor closed head injury #4 hyperglycemia Final diagnoses:  None    New Prescriptions New Prescriptions   No medications on file     Doug Sou, MD 11/20/16 1657    Doug Sou, MD 11/20/16 1700    Doug Sou, MD 11/20/16 [phone]

## 2016-11-20 NOTE — ED Notes (Signed)
Pt wheeled to room from waiting area. Assisted onto bed from wheelchair, and changed into a gown. Pt placed on monitor.

## 2019-09-04 ENCOUNTER — Emergency Department (HOSPITAL_COMMUNITY): Payer: Self-pay

## 2019-09-04 ENCOUNTER — Encounter (HOSPITAL_COMMUNITY): Payer: Self-pay | Admitting: Emergency Medicine

## 2019-09-04 ENCOUNTER — Emergency Department (HOSPITAL_COMMUNITY)
Admission: EM | Admit: 2019-09-04 | Discharge: 2019-09-04 | Disposition: A | Discharge status: Home / Self Care | Payer: Self-pay | Attending: Emergency Medicine | Admitting: Emergency Medicine

## 2019-09-04 ENCOUNTER — Other Ambulatory Visit: Payer: Self-pay

## 2019-09-04 DIAGNOSIS — M25562 Pain in left knee: Secondary | ICD-10-CM | POA: Insufficient documentation

## 2019-09-04 DIAGNOSIS — E119 Type 2 diabetes mellitus without complications: Secondary | ICD-10-CM | POA: Insufficient documentation

## 2019-09-04 DIAGNOSIS — Y939 Activity, unspecified: Secondary | ICD-10-CM | POA: Insufficient documentation

## 2019-09-04 DIAGNOSIS — Y999 Unspecified external cause status: Secondary | ICD-10-CM | POA: Insufficient documentation

## 2019-09-04 DIAGNOSIS — R0789 Other chest pain: Secondary | ICD-10-CM | POA: Insufficient documentation

## 2019-09-04 DIAGNOSIS — M84475A Pathological fracture, left foot, initial encounter for fracture: Secondary | ICD-10-CM | POA: Insufficient documentation

## 2019-09-04 DIAGNOSIS — W19XXXA Unspecified fall, initial encounter: Secondary | ICD-10-CM | POA: Insufficient documentation

## 2019-09-04 DIAGNOSIS — M25512 Pain in left shoulder: Secondary | ICD-10-CM | POA: Insufficient documentation

## 2019-09-04 DIAGNOSIS — Z7984 Long term (current) use of oral hypoglycemic drugs: Secondary | ICD-10-CM | POA: Insufficient documentation

## 2019-09-04 DIAGNOSIS — Y929 Unspecified place or not applicable: Secondary | ICD-10-CM | POA: Insufficient documentation

## 2019-09-04 NOTE — Discharge Instructions (Addendum)
Tylenol and ibuprofen for pain. Follow up with Ortho. Ice and elevated foot

## 2019-09-04 NOTE — ED Provider Notes (Signed)
[facility] EMERGENCY DEPARTMENT Provider Note   CSN: [id number] Arrival date & time: 09/04/19  1108     History Fall   Karina Pierce is a 64 y.o. female with past medical history significant for diabetes who presents for evaluation of fall.  Patient with mechanical fall a week ago last Saturday.  Denies any head, LOC or anticoagulation.  Patient states she has pain to the dorsal aspect of her left foot, left anterior knee pain as well as left rib and left shoulder pain.  She has been ambulatory that difficulty.  Taking Tylenol for pain.  Denies headache, lightheadedness, dizziness, midline back pain, neck pain, chest pain, shortness of breath, abdominal pain, diarrhea, dysuria, decreased range of motion, numbness or tingling, redness, swelling, warmth to extremities.  Majority of her pain is located at the 4 metatarsal to her left lower extremity.  Admits to an ulcer to her 2 tarsal as it has been rubbing on her shoes which is significantly improved from what it began 1 month ago.  No prior history of osteomyelitis.  Rates her pain a 6/10.  Denies additional aggravating or alleviating factors.  History obtained from patient and past medical records.  No interpreter is used.  HPI     Past Medical History:  Diagnosis Date  . Diabetes mellitus without complication (HCC)     There are no problems to display for this patient.   Past Surgical History:  Procedure Laterality Date  . KNEE SURGERY       OB History   No obstetric history on file.     No family history on file.  Social History   Tobacco Use  . Smoking status: Former Games developer  . Smokeless tobacco: Never Used  Substance Use Topics  . Alcohol use: No  . Drug use: No    Home Medications Prior to Admission medications   Medication Sig Start Date End Date Taking? Authorizing Provider  metFORMIN (GLUCOPHAGE-XR) 500 MG 24 hr tablet Take 1 tablet (500 mg total) by mouth daily with breakfast. Patient  taking differently: Take 500 mg by mouth in the morning and at bedtime.  07/25/16  Yes Melene Plan, DO  acetaminophen-codeine (TYLENOL #3) 300-30 MG tablet Take 1-2 tablets by mouth every 6 (six) hours as needed for moderate pain. Patient not taking: Reported on 11/20/2016 10/12/15   Trixie Dredge, PA-C  HYDROcodone-acetaminophen [facility]) 5-325 MG tablet Take 1-2 tablets by mouth every 6 (six) hours as needed for severe pain. Patient not taking: Reported on 09/04/2019 11/20/16   Doug Sou, MD  ibuprofen (ADVIL,MOTRIN) 600 MG tablet Take 1 tablet (600 mg total) by mouth every 6 (six) hours as needed. Patient not taking: Reported on 11/20/2016 07/29/16   Elpidio Anis, PA-C  lidocaine (XYLOCAINE) 2 % solution Use as directed 15 mLs in the mouth or throat as needed for mouth pain. Patient not taking: Reported on 11/20/2016 07/20/16   Harolyn Rutherford C, PA-C  naproxen (NAPROSYN) 500 MG tablet Take 1 tablet (500 mg total) by mouth 2 (two) times daily with a meal. Patient not taking: Reported on 11/20/2016 02/25/16   Garlon Hatchet, PA-C    Allergies    Patient has no known allergies.  Review of Systems   Review of Systems  Constitutional: Negative.   HENT: Negative.   Respiratory: Negative.   Cardiovascular: Negative.        Left rib pain  Gastrointestinal: Negative.   Musculoskeletal: Negative for back pain, gait problem, neck pain and  neck stiffness.       Left foot, knee and shoulder pain  All other systems reviewed and are negative.   Physical Exam Updated Vital Signs BP 138/74 (BP Location: Right Arm)   Pulse 81   Temp 98.3 F (36.8 C) (Oral)   Resp 18   Ht 5\' 9"  (1.753 m)   Wt 106.6 kg   SpO2 99%   BMI 34.70 kg/m   Physical Exam Vitals and nursing note reviewed.  Constitutional:      General: She is not in acute distress.    Appearance: She is well-developed. She is not ill-appearing, toxic-appearing or diaphoretic.  HENT:     Head: Normocephalic and atraumatic.     Nose: Nose  normal.     Mouth/Throat:     Mouth: Mucous membranes are moist.  Eyes:     Pupils: Pupils are equal, round, and reactive to light.  Cardiovascular:     Rate and Rhythm: Normal rate.     Pulses: Normal pulses.     Heart sounds: Normal heart sounds.  Pulmonary:     Effort: Pulmonary effort is normal. No respiratory distress.     Breath sounds: Normal breath sounds.  Abdominal:     General: Bowel sounds are normal. There is no distension.  Musculoskeletal:        General: Normal range of motion.     Cervical back: Normal range of motion.     Comments: Moves all 4 extremities without difficulty.  Anterior left shoulder tenderness however negative Hawkins, empty can.  Full range of motion without difficulty.  Patient with anterior knee pain however able to straight leg raise without difficulty, negative varus, valgus stress, negative anterior drawer.  Full range of motion without difficulty.  Patient with tenderness to fourth metatarsal left lower extremity.  No obvious dislocation.  She does have healing ulceration to her second metatarsal on her left lower extremity.  No bleeding or drainage.  Skin:    General: Skin is warm and dry.     Capillary Refill: Capillary refill takes less than 2 seconds.  Neurological:     General: No focal deficit present.     Mental Status: She is alert.     Comments: Intact sensation to bilateral lower extremities without difficulty.  5/5 strength.  No edema, erythema or warmth.  No fluctuance, induration.  No cellulitis.  Ambulatory without difficulty.     ED Results / Procedures / Treatments   Labs (all labs ordered are listed, but only abnormal results are displayed) Labs Reviewed - No data to display  EKG None  Radiology DG Ribs Unilateral W/Chest Left  Result Date: 09/04/2019 CLINICAL DATA:  [place] and complains of pain on her left side. Pt complains of anterior pain near the lower margin of her ribs. Pt states it feel  like she pulled something inside and states "it doesn't feel like her ribs are broken." EXAM: LEFT RIBS AND CHEST - 3+ VIEW COMPARISON:  Chest radiograph 07/29/2016 FINDINGS: No evidence of a displaced left-sided rib fracture. There is no evidence of pneumothorax or pleural effusion. Minimal linear opacity in the left mid lung likely scarring or atelectasis. Right lung is clear. Heart size and mediastinal contours are within normal limits. IMPRESSION: No evidence of a displaced left-sided rib fracture or pneumothorax. Electronically Signed   By: 07/31/2016 M.D.   On: 09/04/2019 12:36   DG Shoulder Left  Result Date: 09/04/2019 CLINICAL DATA:  Pt [place] and complains of pain on her left side. Pt states it hurts to lift her arm and had difficulty holding position for the axial view. EXAM: LEFT SHOULDER - 2+ VIEW COMPARISON:  None. FINDINGS: There is no evidence of fracture or dislocation. There is no evidence of arthropathy or other focal bone abnormality. Soft tissues are unremarkable. IMPRESSION: Negative left shoulder radiographs. Electronically Signed   By: Audie Pinto M.D.   On: 09/04/2019 12:34   DG Knee Complete 4 Views Left  Result Date: 09/04/2019 CLINICAL DATA:  Pt [place] and complains of pain on her left side. Pt complains of generalized left knee pain. EXAM: LEFT KNEE - COMPLETE 4+ VIEW COMPARISON:  None. FINDINGS: Surgical changes status post ACL repair. No evidence of fracture, dislocation, or joint effusion. Mild patellofemoral compartment degenerative changes. Soft tissues are unremarkable. IMPRESSION: No acute osseous abnormality in the left knee. Electronically Signed   By: Audie Pinto M.D.   On: 09/04/2019 12:37   DG Foot Complete Left  Result Date: 09/04/2019 CLINICAL DATA:  Pt [place] roller [place] and complains of pain on her left side. Pt complains of pain in her 4th toe. EXAM: LEFT FOOT - COMPLETE 3+ VIEW  COMPARISON:  None. FINDINGS: There is a mildly displaced fracture at the base of the fourth proximal phalanx. No additional acute finding in the left foot. Small calcaneal spur. Regional soft tissues are unremarkable. IMPRESSION: Mildly displaced fracture at the base of the fourth proximal phalanx in the left foot. Electronically Signed   By: Audie Pinto M.D.   On: 09/04/2019 12:40    Procedures Procedures (including critical care time)  Medications Ordered in ED Medications - No data to display  ED Course  I have reviewed the triage vital signs and the nursing notes.  Pertinent labs & imaging results that were available during my care of the patient were reviewed by me and considered in my medical decision making (see chart for details).  64 year old female presents for evaluation of mechanical fall which occurred 7 days ago.  Denies hitting head, LOC or anticoagulation.  She is afebrile, nonseptic, not ill-appearing.  Normal musculoskeletal exam.  Neurovascularly intact. Compartments soft. Will obtain plain films.  Plain films with fracture to left fourth metatarsal.  She has been ambulatory at home over the last 7 days.  Will place in walking boot, RICE for symptomatic management however follow-up with Ortho.  I did discuss possible crutches with splinting however patient states that is not possible at home and request walking boot.  Will have close follow-up outpatient.  No evidence of compartment syndrome or overlying skin changes to suggest open fracture.  The patient has been appropriately medically screened and/or stabilized in the ED. I have low suspicion for any other emergent medical condition which would require further screening, evaluation or treatment in the ED or require inpatient management.  Patient is hemodynamically stable and in no acute distress.  Patient able to ambulate in department prior to ED.  Evaluation does not show acute pathology that would require ongoing or  additional emergent interventions while in the emergency department or further inpatient treatment.  I have discussed the diagnosis with the patient and answered all questions.  Pain is been managed while in the emergency department and patient has no further complaints prior to discharge.  Patient is comfortable with plan discussed in room and is stable for discharge at this time.  I have discussed strict return precautions for returning to the emergency department.  Patient was encouraged to follow-up with PCP/specialist refer to at discharge.     MDM Rules/Calculators/A&P                       Final Clinical Impression(s) / ED Diagnoses Final diagnoses:  Fall, initial encounter  Metatarsal fracture, pathologic, left, initial encounter    Rx / DC Orders ED Discharge Orders    None       Arrielle Mcginn A, PA-C 09/04/19 1341    Pricilla Loveless, MD 09/05/19 1635

## 2019-09-04 NOTE — ED Triage Notes (Signed)
Pt arrives POV. Pt unsure how she fell, possible trip. Denies hitting head, denies LOC. Pt endorses left sided pain from left ankle and foot pain to shoulder pain.

## 2019-09-04 NOTE — ED Notes (Signed)
Patient transported to X-ray 

## 2019-10-27 ENCOUNTER — Emergency Department (HOSPITAL_COMMUNITY): Payer: Self-pay

## 2019-10-27 ENCOUNTER — Encounter (HOSPITAL_COMMUNITY): Payer: Self-pay | Admitting: Emergency Medicine

## 2019-10-27 ENCOUNTER — Emergency Department (HOSPITAL_COMMUNITY)
Admission: EM | Admit: 2019-10-27 | Discharge: 2019-10-27 | Disposition: A | Discharge status: Home / Self Care | Payer: Self-pay | Attending: Emergency Medicine | Admitting: Emergency Medicine

## 2019-10-27 DIAGNOSIS — Z79899 Other long term (current) drug therapy: Secondary | ICD-10-CM | POA: Insufficient documentation

## 2019-10-27 DIAGNOSIS — J0120 Acute ethmoidal sinusitis, unspecified: Secondary | ICD-10-CM | POA: Insufficient documentation

## 2019-10-27 DIAGNOSIS — E119 Type 2 diabetes mellitus without complications: Secondary | ICD-10-CM | POA: Insufficient documentation

## 2019-10-27 DIAGNOSIS — R519 Headache, unspecified: Secondary | ICD-10-CM | POA: Insufficient documentation

## 2019-10-27 DIAGNOSIS — Z7984 Long term (current) use of oral hypoglycemic drugs: Secondary | ICD-10-CM | POA: Insufficient documentation

## 2019-10-27 DIAGNOSIS — Z87891 Personal history of nicotine dependence: Secondary | ICD-10-CM | POA: Insufficient documentation

## 2019-10-27 MED ORDER — DIPHENHYDRAMINE HCL 50 MG/ML IJ SOLN
25.0000 mg | Freq: Once | INTRAMUSCULAR | Status: AC
  Administered 2019-10-27: 25 mg via INTRAVENOUS
  Filled 2019-10-27: qty 1

## 2019-10-27 MED ORDER — IBUPROFEN 600 MG PO TABS
600.0000 mg | ORAL_TABLET | Freq: Four times a day (QID) | ORAL | 0 refills | Status: DC | PRN
Start: 2019-10-27 — End: 2020-10-18

## 2019-10-27 MED ORDER — KETOROLAC TROMETHAMINE 30 MG/ML IJ SOLN
30.0000 mg | Freq: Once | INTRAMUSCULAR | Status: AC
  Administered 2019-10-27: 30 mg via INTRAVENOUS
  Filled 2019-10-27: qty 1

## 2019-10-27 MED ORDER — METOCLOPRAMIDE HCL 5 MG/ML IJ SOLN
10.0000 mg | Freq: Once | INTRAMUSCULAR | Status: AC
  Administered 2019-10-27: 10 mg via INTRAVENOUS
  Filled 2019-10-27: qty 2

## 2019-10-27 MED ORDER — DEXAMETHASONE SODIUM PHOSPHATE 10 MG/ML IJ SOLN
10.0000 mg | Freq: Once | INTRAMUSCULAR | Status: AC
  Administered 2019-10-27: 14:00:00 10 mg via INTRAVENOUS
  Filled 2019-10-27: qty 1

## 2019-10-27 MED ORDER — AMOXICILLIN-POT CLAVULANATE 875-125 MG PO TABS
1.0000 | ORAL_TABLET | Freq: Two times a day (BID) | ORAL | 0 refills | Status: DC
Start: 2019-10-27 — End: 2020-10-18

## 2019-10-27 MED ORDER — ACETAMINOPHEN 500 MG PO TABS
1000.0000 mg | ORAL_TABLET | Freq: Once | ORAL | Status: AC
  Administered 2019-10-27: 1000 mg via ORAL
  Filled 2019-10-27: qty 2

## 2019-10-27 NOTE — ED Triage Notes (Signed)
Per EMS-headache/pressure that started on left side-moved to right side this am, left sided pain resolved on its own-no history of migraines, stroke screen negative-light sensitive, increased pain with movement

## 2019-10-27 NOTE — Discharge Instructions (Signed)
Your headache may be due to inflammations of your sinus.  Please take antibiotic as prescribed.  Take ibuprofen as needed for headache.  Follow-up with your primary care provider for further managements of your condition.  Use number below to find a primary care provider if you do not have one. return for any concern.

## 2019-10-27 NOTE — ED Provider Notes (Signed)
[facility]-EMERGENCY DEPT Provider Note   CSN: [id number] Arrival date & time: 10/27/19  4132     History Chief Complaint  Patient presents with  . head pressure    Elbony Mcclimans is a 64 y.o. female.  The history is provided by the patient. No language interpreter was used.     64 year old female with hx of DM presenting for evaluation of headache.  Patient developed gradual onset of headache since yesterday afternoon.  Headache initially started on her left forehead behind her left eye and slowly migrated more towards the right side of her head behind her leg and then spread towards her right scalp and face down towards the right side of her neck.  She went to sleep but woke up with the same pain and now the pain is being a persistent pain prompting this ER visit.  She does endorse some light and sound sensitivity.  She does report increasing headache with movement and with positional changes.  Rates pain as 8 out of 10.  No report of any fever, chills, runny nose sneezing or coughing, nausea vomiting diarrhea, loss of taste or smell, focal numbness or weakness, or rash.  No new environmental changes.  Patient does not normally have headaches.  Denies sudden onset thunderclap headache.  Past Medical History:  Diagnosis Date  . Diabetes mellitus without complication (HCC)     There are no problems to display for this patient.   Past Surgical History:  Procedure Laterality Date  . KNEE SURGERY       OB History   No obstetric history on file.     No family history on file.  Social History   Tobacco Use  . Smoking status: Former Games developer  . Smokeless tobacco: Never Used  Substance Use Topics  . Alcohol use: No  . Drug use: No    Home Medications Prior to Admission medications   Medication Sig Start Date End Date Taking? Authorizing Provider  acetaminophen (TYLENOL) 500 MG tablet Take 1,000 mg by mouth every 6 (six) hours as needed for mild pain or  headache.   Yes [provider]  acetaminophen-codeine (TYLENOL #3) 300-30 MG tablet Take 1-2 tablets by mouth every 6 (six) hours as needed for moderate pain. Patient not taking: Reported on 11/20/2016 10/12/15   Trixie Dredge, PA-C  HYDROcodone-acetaminophen [facility]) 5-325 MG tablet Take 1-2 tablets by mouth every 6 (six) hours as needed for severe pain. Patient not taking: Reported on 10/27/2019 11/20/16   Doug Sou, MD  ibuprofen (ADVIL,MOTRIN) 600 MG tablet Take 1 tablet (600 mg total) by mouth every 6 (six) hours as needed. Patient not taking: Reported on 11/20/2016 07/29/16   Elpidio Anis, PA-C  lidocaine (XYLOCAINE) 2 % solution Use as directed 15 mLs in the mouth or throat as needed for mouth pain. Patient not taking: Reported on 11/20/2016 07/20/16   Anselm Pancoast, PA-C  metFORMIN (GLUCOPHAGE-XR) 500 MG 24 hr tablet Take 1 tablet (500 mg total) by mouth daily with breakfast. Patient taking differently: Take 500 mg by mouth in the morning and at bedtime.  07/25/16   Melene Plan, DO  naproxen (NAPROSYN) 500 MG tablet Take 1 tablet (500 mg total) by mouth 2 (two) times daily with a meal. Patient not taking: Reported on 11/20/2016 02/25/16   Garlon Hatchet, PA-C    Allergies    Patient has no known allergies.  Review of Systems   Review of Systems  All other systems reviewed and  are negative.   Physical Exam Updated Vital Signs BP (!) 142/79 (BP Location: Right Arm)   Pulse 77   Temp 98.2 F (36.8 C)   Resp 18   SpO2 100%   Physical Exam Vitals and nursing note reviewed.  Constitutional:      General: She is not in acute distress.    Appearance: She is well-developed.     Comments: Patient is sitting in a dimly lit room appears mildly uncomfortable but nontoxic.  HENT:     Head: Atraumatic.  Eyes:     Extraocular Movements: Extraocular movements intact.     Conjunctiva/sclera: Conjunctivae normal.     Pupils: Pupils are equal, round, and reactive to light.  Neck:       Vascular: No carotid bruit.  Cardiovascular:     Rate and Rhythm: Normal rate.     Pulses: Normal pulses.     Heart sounds: Normal heart sounds.  Pulmonary:     Effort: Pulmonary effort is normal.     Breath sounds: Normal breath sounds.  Abdominal:     Palpations: Abdomen is soft.  Musculoskeletal:     Cervical back: Normal range of motion and neck supple. No rigidity.  Skin:    Findings: No rash.  Neurological:     Mental Status: She is alert and oriented to person, place, and time.     GCS: GCS eye subscore is 4. GCS verbal subscore is 5. GCS motor subscore is 6.     Cranial Nerves: Cranial nerves are intact.     Sensory: Sensation is intact.     Motor: Motor function is intact.     Coordination: Coordination is intact.     ED Results / Procedures / Treatments   Labs (all labs ordered are listed, but only abnormal results are displayed) Labs Reviewed - No data to display  EKG None  Radiology CT Head Wo Contrast  Result Date: 10/27/2019 CLINICAL DATA:  Progressive headache EXAM: CT HEAD WITHOUT CONTRAST TECHNIQUE: Contiguous axial images were obtained from the base of the skull through the vertex without intravenous contrast. COMPARISON:  None. FINDINGS: Brain: Ventricles and sulci are normal in size and configuration. There is no intracranial mass, hemorrhage, extra-axial fluid collection, or midline shift. Brain parenchyma appears unremarkable. No acute infarct evident. Vascular: No hyperdense vessel. There is slight calcification in each carotid siphon region. Skull: Bony calvarium appears intact. Sinuses/Orbits: There is mucosal thickening in several ethmoid air cells. Other visualized paranasal sinuses are clear. There is leftward deviation of the nasal septum. Orbits appear symmetric bilaterally. Other: Mastoid air cells are clear. IMPRESSION: Brain parenchyma appears unremarkable.  No mass or hemorrhage. There is mild arterial vascular calcification. There is mucosal  thickening in several ethmoid air cells. There is nasal septal deviation. Electronically Signed   By: Lowella Grip III M.D.   On: 10/27/2019 13:20    Procedures Procedures (including critical care time)  Medications Ordered in ED Medications  acetaminophen (TYLENOL) tablet 1,000 mg (1,000 mg Oral Given 10/27/19 1155)  ketorolac (TORADOL) 30 MG/ML injection 30 mg (30 mg Intravenous Given 10/27/19 1359)  metoCLOPramide (REGLAN) injection 10 mg (10 mg Intravenous Given 10/27/19 1359)  diphenhydrAMINE (BENADRYL) injection 25 mg (25 mg Intravenous Given 10/27/19 1400)  dexamethasone (DECADRON) injection 10 mg (10 mg Intravenous Given 10/27/19 1359)    ED Course  I have reviewed the triage vital signs and the nursing notes.  Pertinent labs & imaging results that were available during my care of  the patient were reviewed by me and considered in my medical decision making (see chart for details).    MDM Rules/Calculators/A&P                      BP (!) 142/79 (BP Location: Right Arm)   Pulse 77   Temp 98.2 F (36.8 C)   Resp 18   SpO2 100%   Final Clinical Impression(s) / ED Diagnoses Final diagnoses:  Bad headache  Acute ethmoidal sinusitis, recurrence not specified    Rx / DC Orders ED Discharge Orders         Ordered    ibuprofen (ADVIL) 600 MG tablet  Every 6 hours PRN     10/27/19 1439    amoxicillin-clavulanate (AUGMENTIN) 875-125 MG tablet  2 times daily     10/27/19 1439         11:25 AM Patient presents with gradual onset of left-sided headache now affecting primarily the right side of her head as the right side of her neck.  She does not have any nuchal rigidity on exam.  She is afebrile.  Low suspicion for meningitis.  No sudden onset thunderclap headache to suggest subarachnoid hemorrhage however due to patient normally without history of headache, will obtain head CT scan.  If negative, will provide migraine cocktail.  No focal neuro deficit to suggest a  stroke.  2:35 PM Head CT scan demonstrate normal brain parenchyma, no mass or hemorrhage.  That mucosal thickening in several ethmoid air cells as well as nasal septal deviation.  Since patient has tenderness behind her eyes, it is possible that she may have sinusitis causing her headache.  She did report improvement with migraine cocktail.  Will discharge home with Augmentin as treatment for headache, return function given.   Fayrene Helper, PA-C 10/27/19 1440    Pricilla Loveless, MD 10/28/19 1655

## 2020-01-14 ENCOUNTER — Emergency Department (HOSPITAL_COMMUNITY)
Admission: EM | Admit: 2020-01-14 | Discharge: 2020-01-14 | Disposition: A | Discharge status: Home / Self Care | Payer: Self-pay | Attending: Emergency Medicine | Admitting: Emergency Medicine

## 2020-01-14 ENCOUNTER — Emergency Department (HOSPITAL_COMMUNITY): Payer: Self-pay

## 2020-01-14 ENCOUNTER — Encounter (HOSPITAL_COMMUNITY): Payer: Self-pay | Admitting: Emergency Medicine

## 2020-01-14 ENCOUNTER — Other Ambulatory Visit: Payer: Self-pay

## 2020-01-14 DIAGNOSIS — S39012A Strain of muscle, fascia and tendon of lower back, initial encounter: Secondary | ICD-10-CM | POA: Insufficient documentation

## 2020-01-14 DIAGNOSIS — E119 Type 2 diabetes mellitus without complications: Secondary | ICD-10-CM | POA: Insufficient documentation

## 2020-01-14 DIAGNOSIS — S32000A Wedge compression fracture of unspecified lumbar vertebra, initial encounter for closed fracture: Secondary | ICD-10-CM | POA: Insufficient documentation

## 2020-01-14 DIAGNOSIS — S20211A Contusion of right front wall of thorax, initial encounter: Secondary | ICD-10-CM | POA: Insufficient documentation

## 2020-01-14 DIAGNOSIS — Y92513 Shop (commercial) as the place of occurrence of the external cause: Secondary | ICD-10-CM | POA: Insufficient documentation

## 2020-01-14 DIAGNOSIS — Y9301 Activity, walking, marching and hiking: Secondary | ICD-10-CM | POA: Insufficient documentation

## 2020-01-14 DIAGNOSIS — Y999 Unspecified external cause status: Secondary | ICD-10-CM | POA: Insufficient documentation

## 2020-01-14 DIAGNOSIS — W010XXA Fall on same level from slipping, tripping and stumbling without subsequent striking against object, initial encounter: Secondary | ICD-10-CM | POA: Insufficient documentation

## 2020-01-14 DIAGNOSIS — Z79899 Other long term (current) drug therapy: Secondary | ICD-10-CM | POA: Insufficient documentation

## 2020-01-14 DIAGNOSIS — S8002XA Contusion of left knee, initial encounter: Secondary | ICD-10-CM | POA: Insufficient documentation

## 2020-01-14 MED ORDER — ONDANSETRON 4 MG PO TBDP
4.0000 mg | ORAL_TABLET | Freq: Once | ORAL | Status: AC
  Administered 2020-01-14: 4 mg via ORAL
  Filled 2020-01-14: qty 1

## 2020-01-14 MED ORDER — OXYCODONE-ACETAMINOPHEN 5-325 MG PO TABS
1.0000 | ORAL_TABLET | Freq: Once | ORAL | Status: AC
  Administered 2020-01-14: 1 via ORAL
  Filled 2020-01-14: qty 1

## 2020-01-14 MED ORDER — TRAMADOL HCL 50 MG PO TABS
50.0000 mg | ORAL_TABLET | Freq: Once | ORAL | Status: AC
  Administered 2020-01-14: 50 mg via ORAL
  Filled 2020-01-14: qty 1

## 2020-01-14 MED ORDER — TRAMADOL HCL 50 MG PO TABS: 50.0000 mg | ORAL_TABLET | Freq: Four times a day (QID) | ORAL | 0 refills | Status: DC | PRN

## 2020-01-14 MED ORDER — OXYCODONE-ACETAMINOPHEN 5-325 MG PO TABS: 1.0000 | ORAL_TABLET | Freq: Four times a day (QID) | ORAL | 0 refills | Status: DC | PRN

## 2020-01-14 NOTE — Discharge Instructions (Addendum)
It was our pleasure to provide your ER care today - we hope that you feel better.  Take acetaminophen or ibuprofen as need for pain. You may also take ultram as need for pain - no driving when taking.   Your xrays show a L2 compression fracture - follow up with back/spine specialist in the coming week - call office Monday to arrange follow up.  Wear lumbar brace for comfort/support.   Return to ER if worse, new symptoms, worsening or severe pain, trouble breathing, numbness/weakness, unable to void, weak/fainting, or other concern.   You were given pain medication in the ER - no driving for the next 6 hours.

## 2020-01-14 NOTE — ED Provider Notes (Addendum)
11:38 PM  Pt up for discharge by primary team.  Awaiting clamshell back brace prior to discharge for possible mild compression fracture of L2.  Requesting something stronger for pain than tramadol here in the ED and prescription for something stronger than tramadol to take home.  Prescription was already sent to her pharmacy for tramadol.  Will send 10 tablets of Percocet to her pharmacy as well.  Nurse advised patient that pharmacy will likely not fill both narcotic prescriptions.  Attempted to call [facility] on [address] at [phone] to cancel tramadol prescription.  Received message that the pharmacy is closed and unable to leave a voicemail.  Percocet, Zofran given here in the emergency department.  She has a family member to take her home.       Aadhav Uhlig, Layla Maw, DO 01/14/20 2342

## 2020-01-14 NOTE — Progress Notes (Signed)
Orthopedic Tech Progress Note Patient Details:  Karina Pierce [birth year] [id number]  Patient ID: [name], female   DOB: 03/21/1956, 64 y.o.   MRN: [id number]   Charlott Holler and Routed Lumbar sacral brace order to [facility] 01/14/2020, 10:22 PM

## 2020-01-14 NOTE — ED Triage Notes (Signed)
Pt reports falling while in [employer] and hit head on right and landed on ribs and right knee. Pt denies any LOC.

## 2020-01-14 NOTE — ED Provider Notes (Signed)
[facility]-EMERGENCY DEPT Provider Note   CSN: [id number] Arrival date & time: 01/14/20  2024     History Chief Complaint  Patient presents with  . Fall    Karina Pierce is a 64 y.o. female.  Patient presents s/p fall today. Patient indicates she had walked into a [employer] store, and floor was wet, causing her to slip and fall towards her right side. Indicates hit right ribs, with pain to right lateral chest wall, dull, moderate, worse w certain movements and palpation. Pt also c/o low back pain - dull, moderate, non radiating, worse w positional changes. Pt also c/o left knee pain, mild, dull, non radiating, is able to walk. Denies head injury, contusion, or headache. No anticoag use. No neck pain. Denies chest pain or sob. No abd pain or nv. Denies other extremity pain or injury. Skin intact. Felt fine prior to slip/fall. No faintness or dizziness.   The history is provided by the patient.  Fall Pertinent negatives include no chest pain, no abdominal pain, no headaches and no shortness of breath.       Past Medical History:  Diagnosis Date  . Diabetes mellitus without complication (HCC)     There are no problems to display for this patient.   Past Surgical History:  Procedure Laterality Date  . KNEE SURGERY       OB History   No obstetric history on file.     History reviewed. No pertinent family history.  Social History   Tobacco Use  . Smoking status: Former Games developer  . Smokeless tobacco: Never Used  Substance Use Topics  . Alcohol use: No  . Drug use: No    Home Medications Prior to Admission medications   Medication Sig Start Date End Date Taking? Authorizing Provider  acetaminophen (TYLENOL) 500 MG tablet Take 1,000 mg by mouth every 6 (six) hours as needed for mild pain or headache.    [provider]  amoxicillin-clavulanate (AUGMENTIN) 875-125 MG tablet Take 1 tablet by mouth 2 (two) times daily. One po bid x 7 days 10/27/19    Fayrene Helper, PA-C  ibuprofen (ADVIL) 600 MG tablet Take 1 tablet (600 mg total) by mouth every 6 (six) hours as needed for moderate pain. 10/27/19   Fayrene Helper, PA-C  LUTEIN PO Take 1 tablet by mouth daily.    [provider]  metFORMIN (GLUCOPHAGE-XR) 500 MG 24 hr tablet Take 1 tablet (500 mg total) by mouth daily with breakfast. Patient not taking: Reported on 10/27/2019 07/25/16 10/27/19  Melene Plan, DO    Allergies    Patient has no known allergies.  Review of Systems   Review of Systems  Constitutional: Negative for fever.  HENT: Negative for nosebleeds.   Eyes: Negative for visual disturbance.  Respiratory: Negative for shortness of breath.   Cardiovascular: Negative for chest pain.  Gastrointestinal: Negative for abdominal pain, nausea and vomiting.  Genitourinary: Negative for flank pain.  Musculoskeletal: Negative for back pain and neck pain.  Skin: Negative for wound.  Neurological: Negative for weakness, numbness and headaches.  Hematological: Does not bruise/bleed easily.  Psychiatric/Behavioral: Negative for confusion.    Physical Exam Updated Vital Signs BP (!) 154/80 (BP Location: Left Arm)   Pulse 72   Temp 98.6 F (37 C) (Oral)   Resp 16   Ht 1.753 m (5\' 9" )   Wt 105.7 kg   SpO2 96%   BMI 34.41 kg/m   Physical Exam Vitals and nursing note reviewed.  Constitutional:      Appearance: Normal appearance. She is well-developed.  HENT:     Head: Atraumatic.     Nose: Nose normal.     Mouth/Throat:     Mouth: Mucous membranes are moist.  Eyes:     General: No scleral icterus.    Conjunctiva/sclera: Conjunctivae normal.     Pupils: Pupils are equal, round, and reactive to light.  Neck:     Trachea: No tracheal deviation.  Cardiovascular:     Rate and Rhythm: Normal rate and regular rhythm.     Pulses: Normal pulses.     Heart sounds: Normal heart sounds. No murmur heard.  No friction rub. No gallop.   Pulmonary:     Effort: Pulmonary effort  is normal. No respiratory distress.     Breath sounds: Normal breath sounds.     Comments: Right lateral chest wall/rib tenderness. No crepitus. Normal chest wall movement.  Abdominal:     General: Bowel sounds are normal. There is no distension.     Palpations: Abdomen is soft.     Tenderness: There is no abdominal tenderness. There is no guarding.     Comments: No abd contusion or bruising.   Genitourinary:    Comments: No cva tenderness.  Musculoskeletal:        General: No swelling.     Cervical back: Normal range of motion and neck supple. No rigidity. No muscular tenderness.     Comments: Mid lumbar tenderness, otherwise, CTLS spine, non tender, aligned, no step off.  Tenderness left knee anteriorly, no effusion, knee stable. Distal pulses palp bil. Camwalker left foot/ankle - pt indicates prior fx toe. No other new focal bony tenderness noted on extremity exam.   Skin:    General: Skin is warm and dry.     Findings: No rash.  Neurological:     Mental Status: She is alert.     Comments: Alert, speech normal. GCS 15. Motor/sens grossly intact bil.   Psychiatric:        Mood and Affect: Mood normal.     ED Results / Procedures / Treatments   Labs (all labs ordered are listed, but only abnormal results are displayed) Labs Reviewed - No data to display  EKG None  Radiology DG Ribs Unilateral W/Chest Right  Result Date: 01/14/2020 CLINICAL DATA:  Fall, pain EXAM: RIGHT RIBS AND CHEST - 3+ VIEW COMPARISON:  None. FINDINGS: No fracture or other bone lesions are seen involving the ribs. There is no evidence of pneumothorax or pleural effusion. Both lungs are clear. Heart size and mediastinal contours are within normal limits. IMPRESSION: Negative. Electronically Signed   By: Jonna Clark M.D.   On: 01/14/2020 22:04   DG Lumbar Spine Complete  Result Date: 01/14/2020 CLINICAL DATA:  64 year old female with fall and right lower lateral rib pain. EXAM: LUMBAR SPINE - COMPLETE 4+  VIEW COMPARISON:  CT abdomen pelvis dated 11/20/2016. FINDINGS: Five lumbar type vertebra. There is mild compression fracture of the superior endplate of L2 with approximately 10-15% loss of vertebral body height, age indeterminate but new since the prior CT. Correlation with point tenderness recommended. Mild age indeterminate T11 compression fracture also noted. The visualized posterior elements appear intact. The soft tissues are unremarkable. IMPRESSION: Mild compression fracture of the superior endplate of L2, age indeterminate but new since the prior CT. Electronically Signed   By: Elgie Collard M.D.   On: 01/14/2020 22:08   DG Knee Complete 4 Views Left  Result Date: 01/14/2020 CLINICAL DATA:  Fall, knee pain EXAM: LEFT KNEE - COMPLETE 4+ VIEW COMPARISON:  Radiograph 09/04/2019 FINDINGS: Suboptimal lateral projection, may limit detection of subtle anomaly. No acute bony abnormality. Specifically, no fracture, subluxation, or dislocation. Trace effusion. Background of mild mild tricompartmental degenerative change. Enthesopathic changes along the extensor mechanism. Postsurgical features of prior ACL repair, hardware alignment suboptimally assessed given nonstandard views. IMPRESSION: 1. Trace effusion. No acute osseous abnormality. 2. Background of mild tricompartmental degenerative change. 3. Prior ACL repair. Electronically Signed   By: Kreg Shropshire M.D.   On: 01/14/2020 22:04    Procedures Procedures (including critical care time)  Medications Ordered in ED Medications - No data to display  ED Course  I have reviewed the triage vital signs and the nursing notes.  Pertinent labs & imaging results that were available during my care of the patient were reviewed by me and considered in my medical decision making (see chart for details).    MDM Rules/Calculators/A&P                         Imaging studies ordered.   Reviewed nursing notes and prior charts for additional history.    Ultram po.  Xrays reviewed/interpreted by me - L2 compression fx. Discussed w pt. Pt has no radicular pain. No leg numbness or weakness. Patient has no saddle area of perineal numbness. No report of urinary retention (or incontinence).  Discussed xrays w pt, and plan for outpt f/u.   Pain controlled.   Lumbar support.  Pt currently appears stable for d/c.   Return precautions provided.     Final Clinical Impression(s) / ED Diagnoses Final diagnoses:  None    Rx / DC Orders ED Discharge Orders    None       Cathren Laine, MD 01/14/20 2218

## 2020-01-14 NOTE — ED Notes (Signed)
Ortho calling to get lumbar sacral support device.

## 2020-01-14 NOTE — Progress Notes (Signed)
Orthopedic Tech Progress Note Patient Details:  Karina Pierce 05/17/1956 [id number]  Patient ID: Karina Pierce, female   DOB: 02/01/1956, 64 y.o.   MRN: [id number]   Saul Fordyce 01/14/2020, 11:06 PMCalled [employer] to check on brace

## 2020-10-18 ENCOUNTER — Other Ambulatory Visit: Payer: Self-pay

## 2020-10-18 ENCOUNTER — Emergency Department (HOSPITAL_COMMUNITY)
Admission: EM | Admit: 2020-10-18 | Discharge: 2020-10-18 | Disposition: A | Discharge status: Home / Self Care | Payer: Medicare HMO | Attending: Emergency Medicine | Admitting: Emergency Medicine

## 2020-10-18 ENCOUNTER — Encounter (HOSPITAL_COMMUNITY): Payer: Self-pay

## 2020-10-18 DIAGNOSIS — K0889 Other specified disorders of teeth and supporting structures: Secondary | ICD-10-CM | POA: Diagnosis present

## 2020-10-18 DIAGNOSIS — E119 Type 2 diabetes mellitus without complications: Secondary | ICD-10-CM | POA: Insufficient documentation

## 2020-10-18 DIAGNOSIS — K029 Dental caries, unspecified: Secondary | ICD-10-CM | POA: Diagnosis not present

## 2020-10-18 DIAGNOSIS — Z7984 Long term (current) use of oral hypoglycemic drugs: Secondary | ICD-10-CM | POA: Insufficient documentation

## 2020-10-18 DIAGNOSIS — Z87891 Personal history of nicotine dependence: Secondary | ICD-10-CM | POA: Insufficient documentation

## 2020-10-18 MED ORDER — IBUPROFEN 800 MG PO TABS
800.0000 mg | ORAL_TABLET | Freq: Once | ORAL | Status: AC
  Administered 2020-10-18: 800 mg via ORAL
  Filled 2020-10-18: qty 1

## 2020-10-18 MED ORDER — ACETAMINOPHEN 500 MG PO TABS: 500.0000 mg | ORAL_TABLET | Freq: Four times a day (QID) | ORAL | 0 refills | Status: AC | PRN

## 2020-10-18 MED ORDER — CLINDAMYCIN HCL 300 MG PO CAPS: 450.0000 mg | ORAL_CAPSULE | Freq: Once | ORAL | Status: DC

## 2020-10-18 MED ORDER — AMOXICILLIN-POT CLAVULANATE 875-125 MG PO TABS
1.0000 | ORAL_TABLET | Freq: Once | ORAL | Status: AC
  Administered 2020-10-18: 1 via ORAL
  Filled 2020-10-18: qty 1

## 2020-10-18 MED ORDER — AMOXICILLIN-POT CLAVULANATE 875-125 MG PO TABS
1.0000 | ORAL_TABLET | Freq: Two times a day (BID) | ORAL | 0 refills | Status: DC
Start: 2020-10-18 — End: 2021-11-30

## 2020-10-18 MED ORDER — IBUPROFEN 600 MG PO TABS
600.0000 mg | ORAL_TABLET | Freq: Four times a day (QID) | ORAL | 0 refills | Status: DC | PRN
Start: 2020-10-18 — End: 2021-12-03

## 2020-10-18 NOTE — ED Triage Notes (Signed)
Patient c/o left upper dental abscess since last night.

## 2020-10-18 NOTE — Discharge Instructions (Addendum)
Please read the attachments, particularly the one for local dental resources.  Please take the Augmentin, as prescribed.  Have also prescribed ibuprofen and Tylenol to take as needed for pain symptoms.  Please go to your appointment with your dentist on 10/31/2020, as scheduled.  If you call them and notify them of today's ED encounter, they may be able to move you up or place you on a wait list.  I cannot emphasize enough the importance of getting established with a primary care provider.    Please call the [facility] health committee health and wellness

## 2020-10-18 NOTE — ED Provider Notes (Signed)
[facility]-EMERGENCY DEPT Provider Note   CSN: [id number] Arrival date & time: 10/18/20  5726     History No chief complaint on file.   Karina Pierce is a 65 y.o. female with past medical history of type II DM on metformin who presents the ED with complaints of dental abscess.  On my examination, patient reports that yesterday she developed left upper dental pain.  She states that she has a history of dental abscesses and this feels similar.  She denies any fevers, chills, neck pain, inability to open her mouth, shortness of breath, stridor, drooling, or any other symptoms.  She took Advil yesterday, without any significant relief.  She states that she has an upcoming appointment to get established with a dentist on 10/31/2020.  Given that the weekend this morning, she wanted to come to the ED for antibiotic treatment before it gets worse.  HPI     Past Medical History:  Diagnosis Date  . Diabetes mellitus without complication (HCC)     There are no problems to display for this patient.   Past Surgical History:  Procedure Laterality Date  . KNEE SURGERY       OB History   No obstetric history on file.     Family History  Family history unknown: Yes    Social History   Tobacco Use  . Smoking status: Former Games developer  . Smokeless tobacco: Never Used  Vaping Use  . Vaping Use: Never used  Substance Use Topics  . Alcohol use: No  . Drug use: No    Home Medications Prior to Admission medications   Medication Sig Start Date End Date Taking? Authorizing Provider  acetaminophen (TYLENOL) 500 MG tablet Take 1 tablet (500 mg total) by mouth every 6 (six) hours as needed. 10/18/20  Yes Lorelee New, PA-C  amoxicillin-clavulanate (AUGMENTIN) 875-125 MG tablet Take 1 tablet by mouth every 12 (twelve) hours. 10/18/20  Yes Lorelee New, PA-C  ibuprofen (ADVIL) 600 MG tablet Take 1 tablet (600 mg total) by mouth every 6 (six) hours as needed. 10/18/20   Yes Lorelee New, PA-C  LUTEIN PO Take 1 tablet by mouth daily.    [provider]  metFORMIN (GLUCOPHAGE-XR) 500 MG 24 hr tablet Take 500 mg by mouth in the morning and at bedtime.    [provider]  oxyCODONE-acetaminophen (PERCOCET/ROXICET) 5-325 MG tablet Take 1 tablet by mouth every 6 (six) hours as needed. 01/14/20   Ward, Layla Maw, DO    Allergies    Patient has no known allergies.  Review of Systems   Review of Systems  All other systems reviewed and are negative.   Physical Exam Updated Vital Signs BP 139/75 (BP Location: Left Arm)   Pulse 81   Temp 98.6 F (37 C) (Oral)   Resp 16   Ht 5\' 9"  (1.753 m)   Wt 107 kg   SpO2 98%   BMI 34.85 kg/m   Physical Exam Vitals and nursing note reviewed. Exam conducted with a chaperone present.  Constitutional:      Appearance: Normal appearance.  HENT:     Head: Normocephalic and atraumatic.     Mouth/Throat:     Comments: Patent oropharynx.  No uvular deviation.  Poor dentition noted throughout.  Multiple caries noted throughout mouth.  Tenderness involving area of #13 tooth.  It had been previously capped.  No area of fluctuance or mass concerning for abscess amenable to drainage.  She is  tolerating secretions well.  Pain with opening mouth, but no significant trismus.  No dental instability. Eyes:     General: No scleral icterus.    Conjunctiva/sclera: Conjunctivae normal.  Neck:     Comments: ROM intact.  No swelling. Pulmonary:     Effort: Pulmonary effort is normal. No respiratory distress.     Breath sounds: No stridor. No wheezing.  Musculoskeletal:     Cervical back: Normal range of motion. No rigidity.  Skin:    General: Skin is dry.  Neurological:     Mental Status: She is alert and oriented to person, place, and time.     GCS: GCS eye subscore is 4. GCS verbal subscore is 5. GCS motor subscore is 6.  Psychiatric:        Mood and Affect: Mood normal.        Behavior: Behavior normal.         Thought Content: Thought content normal.     ED Results / Procedures / Treatments   Labs (all labs ordered are listed, but only abnormal results are displayed) Labs Reviewed - No data to display  EKG None  Radiology No results found.  Procedures Procedures   Medications Ordered in ED Medications  ibuprofen (ADVIL) tablet 800 mg (800 mg Oral Given 10/18/20 0845)  amoxicillin-clavulanate (AUGMENTIN) 875-125 MG per tablet 1 tablet (1 tablet Oral Given 10/18/20 0845)    ED Course  I have reviewed the triage vital signs and the nursing notes.  Pertinent labs & imaging results that were available during my care of the patient were reviewed by me and considered in my medical decision making (see chart for details).    MDM Rules/Calculators/A&P                          Karina Pierce was evaluated in Emergency Department on 10/18/2020 for the symptoms described in the history of present illness. She was evaluated in the context of the global COVID-19 pandemic, which necessitated consideration that the patient might be at risk for infection with the SARS-CoV-2 virus that causes COVID-19. Institutional protocols and algorithms that pertain to the evaluation of patients at risk for COVID-19 are in a state of rapid change based on information released by regulatory bodies including the CDC and federal and state organizations. These policies and algorithms were followed during the patient's care in the ED.  I personally reviewed patient's medical chart and all notes from triage and staff during today's encounter. I have also ordered and reviewed all labs and imaging that I felt to be medically necessary in the evaluation of this patient's complaints and with consideration of their physical exam. If needed, translation services were available and utilized.   Patient presents to the ED with dental pain, but without any abscess amenable to drainage.  Patient does have left-sided maxillary region  tenderness, but no significant swelling, warmth, redness, or other overlying skin changes.  I have low suspicion for a large maxillary or other deep tissue abscess at this time.  Do not feel as though CT imaging warranted at this time.  She denies any fevers or chills, difficulty swallowing or breathing, or other symptoms.  Patient able to speak in complete sentences without difficulty swallowing or trouble handling secretions.  No stridor, wheezing, tripoding, grey pseudomembrane, trismus, uvular deviation, sublingual/submandibular/submental swelling or induration, nuchal rigidity, or neck pain.  Low suspicion for deep tissue infection such as mandibular or maxillary abscess, PTA  or RPA, epiglottitis, or other emergent pathology.   Discussed discharge home with clindamycin, patient states that she has taken amoxicillin in the past with good effect and would prefer the antibiotic.  We will provide patient with first dose of Augmentin here in the ED as well as 800 mg ibuprofen.  Will discharge her home with continued Augmentin antibiotics and provide patient with dental resources.    She states that she just got [employer], but her primary care provider retired and she has not seen anybody recently.  I encouraged her to call around to offices to get established as soon as possible.  I will also provide her with a referral to [facility] so that she may get established with a primary care provider for ongoing evaluation and management of her health and wellbeing.   Final Clinical Impression(s) / ED Diagnoses Final diagnoses:  Pain, dental    Rx / DC Orders ED Discharge Orders         Ordered    amoxicillin-clavulanate (AUGMENTIN) 875-125 MG tablet  Every 12 hours        10/18/20 0900    ibuprofen (ADVIL) 600 MG tablet  Every 6 hours PRN        10/18/20 0900    acetaminophen (TYLENOL) 500 MG tablet  Every 6 hours PRN        10/18/20 0900           Lorelee New, PA-C 10/18/20 3559    Lorre Nick, MD 10/23/20 1932

## 2021-11-30 ENCOUNTER — Emergency Department (HOSPITAL_COMMUNITY): Payer: 59

## 2021-11-30 ENCOUNTER — Other Ambulatory Visit: Payer: Self-pay

## 2021-11-30 ENCOUNTER — Inpatient Hospital Stay (HOSPITAL_COMMUNITY)
Admission: EM | Admit: 2021-11-30 | Discharge: 2021-12-03 | DRG: 247 | Disposition: A | Discharge status: Home / Self Care | Payer: 59 | Attending: Cardiology | Admitting: Cardiology

## 2021-11-30 ENCOUNTER — Encounter (HOSPITAL_COMMUNITY): Payer: Self-pay

## 2021-11-30 ENCOUNTER — Observation Stay (HOSPITAL_COMMUNITY): Payer: 59

## 2021-11-30 DIAGNOSIS — Z7984 Long term (current) use of oral hypoglycemic drugs: Secondary | ICD-10-CM

## 2021-11-30 DIAGNOSIS — R079 Chest pain, unspecified: Secondary | ICD-10-CM | POA: Diagnosis present

## 2021-11-30 DIAGNOSIS — D649 Anemia, unspecified: Secondary | ICD-10-CM | POA: Diagnosis present

## 2021-11-30 DIAGNOSIS — I10 Essential (primary) hypertension: Secondary | ICD-10-CM

## 2021-11-30 DIAGNOSIS — Z8249 Family history of ischemic heart disease and other diseases of the circulatory system: Secondary | ICD-10-CM

## 2021-11-30 DIAGNOSIS — I25110 Atherosclerotic heart disease of native coronary artery with unstable angina pectoris: Secondary | ICD-10-CM | POA: Diagnosis present

## 2021-11-30 DIAGNOSIS — Z955 Presence of coronary angioplasty implant and graft: Secondary | ICD-10-CM

## 2021-11-30 DIAGNOSIS — Z87891 Personal history of nicotine dependence: Secondary | ICD-10-CM

## 2021-11-30 DIAGNOSIS — I214 Non-ST elevation (NSTEMI) myocardial infarction: Principal | ICD-10-CM | POA: Diagnosis present

## 2021-11-30 DIAGNOSIS — Z79899 Other long term (current) drug therapy: Secondary | ICD-10-CM

## 2021-11-30 DIAGNOSIS — E118 Type 2 diabetes mellitus with unspecified complications: Secondary | ICD-10-CM

## 2021-11-30 DIAGNOSIS — E119 Type 2 diabetes mellitus without complications: Secondary | ICD-10-CM

## 2021-11-30 DIAGNOSIS — E785 Hyperlipidemia, unspecified: Secondary | ICD-10-CM

## 2021-11-30 DIAGNOSIS — I200 Unstable angina: Secondary | ICD-10-CM

## 2021-11-30 LAB — ECHOCARDIOGRAM COMPLETE
Height: 70 in
MV M vel: 4.69 m/s
MV Peak grad: 88 mmHg
Radius: 0.3 cm
S' Lateral: 3.3 cm
Weight: 4000 oz

## 2021-11-30 LAB — BRAIN NATRIURETIC PEPTIDE: B Natriuretic Peptide: 51.9 pg/mL (ref 0.0–100.0)

## 2021-11-30 LAB — BASIC METABOLIC PANEL
Anion gap: 8 (ref 5–15)
BUN: 14 mg/dL (ref 8–23)
CO2: 24 mmol/L (ref 22–32)
Calcium: 9 mg/dL (ref 8.9–10.3)
Chloride: 107 mmol/L (ref 98–111)
Creatinine, Ser: 0.91 mg/dL (ref 0.44–1.00)
GFR, Estimated: >60 mL/min (ref >60)
Glucose, Bld: 231 mg/dL — ABNORMAL HIGH (ref 70–99)
Potassium: 3.8 mmol/L (ref 3.5–5.1)
Sodium: 139 mmol/L (ref 135–145)

## 2021-11-30 LAB — URINALYSIS, ROUTINE W REFLEX MICROSCOPIC
Bilirubin Urine: NEGATIVE
Glucose, UA: 150 mg/dL — AB
Hgb urine dipstick: NEGATIVE
Ketones, ur: 5 mg/dL — AB
Leukocytes,Ua: NEGATIVE
Nitrite: NEGATIVE
Protein, ur: NEGATIVE mg/dL
Specific Gravity, Urine: 1.006 (ref 1.005–1.030)
pH: 6 (ref 5.0–8.0)

## 2021-11-30 LAB — CBC
HCT: 33.3 % — ABNORMAL LOW (ref 36.0–46.0)
Hemoglobin: 10.9 g/dL — ABNORMAL LOW (ref 12.0–15.0)
MCH: 27.7 pg (ref 26.0–34.0)
MCHC: 32.7 g/dL (ref 30.0–36.0)
MCV: 84.5 fL (ref 80.0–100.0)
Platelets: 371 10*3/uL (ref 150–400)
RBC: 3.94 MIL/uL (ref 3.87–5.11)
RDW: 13.2 % (ref 11.5–15.5)
WBC: 9.9 10*3/uL (ref 4.0–10.5)
nRBC: 0 % (ref 0.0–0.2)

## 2021-11-30 LAB — D-DIMER, QUANTITATIVE: D-Dimer, Quant: 1.83 ug/mL-FEU — ABNORMAL HIGH (ref 0.00–0.50)

## 2021-11-30 LAB — HEMOGLOBIN A1C
Hgb A1c MFr Bld: 10 % — ABNORMAL HIGH (ref 4.8–5.6)
Mean Plasma Glucose: 240.3 mg/dL

## 2021-11-30 LAB — LIPASE, BLOOD: Lipase: 52 U/L — ABNORMAL HIGH (ref 11–51)

## 2021-11-30 LAB — HEPATIC FUNCTION PANEL
ALT: 13 U/L (ref 0–44)
AST: 16 U/L (ref 15–41)
Albumin: 2.8 g/dL — ABNORMAL LOW (ref 3.5–5.0)
Alkaline Phosphatase: 76 U/L (ref 38–126)
Bilirubin, Direct: 0.2 mg/dL (ref 0.0–0.2)
Indirect Bilirubin: 0.6 mg/dL (ref 0.3–0.9)
Total Bilirubin: 0.8 mg/dL (ref 0.3–1.2)
Total Protein: 5.9 g/dL — ABNORMAL LOW (ref 6.5–8.1)

## 2021-11-30 LAB — PROTIME-INR
INR: 0.9 (ref 0.8–1.2)
Prothrombin Time: 11.6 seconds (ref 11.4–15.2)

## 2021-11-30 LAB — TROPONIN I (HIGH SENSITIVITY)
Troponin I (High Sensitivity): 12 ng/L (ref <18)
Troponin I (High Sensitivity): 73 ng/L — ABNORMAL HIGH (ref <18)
Troponin I (High Sensitivity): 1244 ng/L (ref <18)

## 2021-11-30 LAB — GLUCOSE, CAPILLARY
Glucose-Capillary: 258 mg/dL — ABNORMAL HIGH (ref 70–99)
Glucose-Capillary: 178 mg/dL — ABNORMAL HIGH (ref 70–99)

## 2021-11-30 LAB — HEPARIN LEVEL (UNFRACTIONATED): Heparin Unfractionated: 0.66 IU/mL (ref 0.30–0.70)

## 2021-11-30 LAB — HIV ANTIBODY (ROUTINE TESTING W REFLEX): HIV Screen 4th Generation wRfx: NONREACTIVE

## 2021-11-30 MED ORDER — HEPARIN BOLUS VIA INFUSION
4000.0000 [IU] | Freq: Once | INTRAVENOUS | Status: AC
  Administered 2021-11-30: 4000 [IU] via INTRAVENOUS
  Filled 2021-11-30: qty 4000

## 2021-11-30 MED ORDER — NITROGLYCERIN 0.2 MG/HR TD PT24
0.2000 mg | MEDICATED_PATCH | Freq: Every day | TRANSDERMAL | Status: DC
  Administered 2021-11-30: 0.2 mg via TRANSDERMAL
  Filled 2021-11-30: qty 1

## 2021-11-30 MED ORDER — ACETAMINOPHEN 325 MG PO TABS
650.0000 mg | ORAL_TABLET | ORAL | Status: DC | PRN
  Administered 2021-11-30: 650 mg via ORAL
  Filled 2021-11-30: qty 2

## 2021-11-30 MED ORDER — MORPHINE SULFATE (PF) 2 MG/ML IV SOLN
2.0000 mg | Freq: Once | INTRAVENOUS | Status: AC
  Administered 2021-11-30: 2 mg via INTRAVENOUS
  Filled 2021-11-30: qty 1

## 2021-11-30 MED ORDER — IOHEXOL 350 MG/ML SOLN
100.0000 mL | Freq: Once | INTRAVENOUS | Status: AC | PRN
  Administered 2021-11-30: 100 mL via INTRAVENOUS

## 2021-11-30 MED ORDER — ONDANSETRON HCL 4 MG/2ML IJ SOLN: 4.0000 mg | Freq: Four times a day (QID) | INTRAMUSCULAR | Status: DC | PRN

## 2021-11-30 MED ORDER — METOPROLOL TARTRATE 12.5 MG HALF TABLET
12.5000 mg | ORAL_TABLET | Freq: Two times a day (BID) | ORAL | Status: DC
  Administered 2021-12-02 – 2021-12-03 (×3): 12.5 mg via ORAL
  Filled 2021-11-30 (×4): qty 1

## 2021-11-30 MED ORDER — FUROSEMIDE 10 MG/ML IJ SOLN
20.0000 mg | Freq: Once | INTRAMUSCULAR | Status: AC
  Administered 2021-11-30: 20 mg via INTRAVENOUS
  Filled 2021-11-30: qty 2

## 2021-11-30 MED ORDER — HEPARIN (PORCINE) 25000 UT/250ML-% IV SOLN
1300.0000 [IU]/h | INTRAVENOUS | Status: DC
  Administered 2021-11-30 – 2021-12-01 (×3): 1300 [IU]/h via INTRAVENOUS
  Filled 2021-11-30 (×3): qty 250

## 2021-11-30 MED ORDER — ALBUTEROL SULFATE (2.5 MG/3ML) 0.083% IN NEBU
2.5000 mg | INHALATION_SOLUTION | Freq: Four times a day (QID) | RESPIRATORY_TRACT | Status: DC | PRN
  Administered 2021-11-30 – 2021-12-02 (×2): 2.5 mg via RESPIRATORY_TRACT
  Filled 2021-11-30 (×2): qty 3

## 2021-11-30 MED ORDER — NITROGLYCERIN 0.4 MG SL SUBL: 0.4000 mg | SUBLINGUAL_TABLET | SUBLINGUAL | Status: DC | PRN

## 2021-11-30 MED ORDER — NITROGLYCERIN IN D5W 200-5 MCG/ML-% IV SOLN
0.0000 ug/min | INTRAVENOUS | Status: DC
  Administered 2021-11-30: 5 ug/min via INTRAVENOUS
  Filled 2021-11-30: qty 250

## 2021-11-30 MED ORDER — INSULIN ASPART 100 UNIT/ML IJ SOLN
0.0000 [IU] | Freq: Three times a day (TID) | INTRAMUSCULAR | Status: DC
  Administered 2021-11-30: 8 [IU] via SUBCUTANEOUS
  Administered 2021-12-01: 3 [IU] via SUBCUTANEOUS
  Administered 2021-12-01: 5 [IU] via SUBCUTANEOUS
  Administered 2021-12-01 – 2021-12-02 (×2): 8 [IU] via SUBCUTANEOUS
  Administered 2021-12-02: 5 [IU] via SUBCUTANEOUS
  Administered 2021-12-03: 2 [IU] via SUBCUTANEOUS

## 2021-11-30 MED ORDER — ASPIRIN 81 MG PO TBEC
81.0000 mg | DELAYED_RELEASE_TABLET | Freq: Every day | ORAL | Status: DC
  Administered 2021-12-01 – 2021-12-03 (×2): 81 mg via ORAL
  Filled 2021-11-30 (×2): qty 1

## 2021-11-30 MED ORDER — ONDANSETRON HCL 4 MG/2ML IJ SOLN
4.0000 mg | Freq: Once | INTRAMUSCULAR | Status: AC
  Administered 2021-11-30: 4 mg via INTRAVENOUS
  Filled 2021-11-30: qty 2

## 2021-11-30 MED ORDER — ROSUVASTATIN CALCIUM 20 MG PO TABS
40.0000 mg | ORAL_TABLET | Freq: Every day | ORAL | Status: DC
  Administered 2021-11-30: 40 mg via ORAL
  Filled 2021-11-30 (×2): qty 2

## 2021-11-30 NOTE — ED Notes (Signed)
Attempting to obtain and complete triage and assessment pt is slow to respond to answer questions and is moaning and groaning as if in pain. When starting PIV when this RN would touch the pt anywhere she would moan and groan as if it was causing her pain from the touches

## 2021-11-30 NOTE — ED Provider Notes (Signed)
[facility] EMERGENCY DEPARTMENT Provider Note   CSN: [id number] Arrival date & time: 11/30/21  0534     History  Chief Complaint  Patient presents with   Chest Pain   Shortness of Breath    Karina Pierce is a 66 y.o. female.  Patient here with chest pain, shortness of breath, generalized weakness.  Patient states that she woke up not feeling well, was having some discomfort in the middle of her chest, fell to the ground.  Family member states that she seemed to be unconscious for a little bit.  She got nitroglycerin and aspirin with EMS but with no improvement of her discomfort.  She points to the on the sides of both of her breasts for where her pain is.  She denies any recent surgery or travel.  No cardiac history.  History of diabetes.  No blood clot history.  Has not had chest pain or any issues the last several days.  Went to bed feeling fine.  Nothing is making it worse or better.  The history is provided by the patient.      Home Medications Prior to Admission medications   Medication Sig Start Date End Date Taking? Authorizing Provider  acetaminophen (TYLENOL) 500 MG tablet Take 1 tablet (500 mg total) by mouth every 6 (six) hours as needed. 10/18/20   Lorelee New, PA-C  amoxicillin-clavulanate (AUGMENTIN) 875-125 MG tablet Take 1 tablet by mouth every 12 (twelve) hours. 10/18/20   Lorelee New, PA-C  ibuprofen (ADVIL) 600 MG tablet Take 1 tablet (600 mg total) by mouth every 6 (six) hours as needed. 10/18/20   Lorelee New, PA-C  LUTEIN PO Take 1 tablet by mouth daily.    [provider]  metFORMIN (GLUCOPHAGE-XR) 500 MG 24 hr tablet Take 500 mg by mouth in the morning and at bedtime.    [provider]  oxyCODONE-acetaminophen (PERCOCET/ROXICET) 5-325 MG tablet Take 1 tablet by mouth every 6 (six) hours as needed. 01/14/20   Ward, Layla Maw, DO      Allergies    Patient has no known allergies.    Review of Systems   Review of  Systems  Physical Exam Updated Vital Signs BP 127/69   Pulse 72   Temp 98.4 F (36.9 C) (Oral)   Resp 15   Ht  (1.778 m)   Wt 113.4 kg   SpO2 98%   BMI 35.87 kg/m  Physical Exam Vitals and nursing note reviewed.  Constitutional:      General: She is not in acute distress.    Appearance: She is well-developed. She is not ill-appearing.  HENT:     Head: Normocephalic and atraumatic.  Eyes:     Extraocular Movements: Extraocular movements intact.     Conjunctiva/sclera: Conjunctivae normal.     Pupils: Pupils are equal, round, and reactive to light.  Cardiovascular:     Rate and Rhythm: Normal rate and regular rhythm.     Pulses:          Radial pulses are 2+ on the right side and 2+ on the left side.     Heart sounds: Normal heart sounds. No murmur heard. Pulmonary:     Effort: Pulmonary effort is normal. No respiratory distress.     Breath sounds: Normal breath sounds. No decreased breath sounds, wheezing or rhonchi.  Chest:     Chest wall: Tenderness present.  Abdominal:     Palpations: Abdomen is soft.  Tenderness: There is no abdominal tenderness.  Musculoskeletal:        General: No swelling. Normal range of motion.     Cervical back: Normal range of motion and neck supple.     Left lower leg: No edema.  Skin:    General: Skin is warm and dry.     Capillary Refill: Capillary refill takes less than 2 seconds.  Neurological:     General: No focal deficit present.     Mental Status: She is alert and oriented to person, place, and time.     Cranial Nerves: No cranial nerve deficit.     Motor: No weakness.     Comments: 5+ out of 5 strength throughout, normal sensation, no drift, normal finger-nose-finger, normal speech, no visual field deficit  Psychiatric:        Mood and Affect: Mood normal.    ED Results / Procedures / Treatments   Labs (all labs ordered are listed, but only abnormal results are displayed) Labs Reviewed  BASIC METABOLIC PANEL -  Abnormal; Notable for the following components:      Result Value   Glucose, Bld 231 (*)    All other components within normal limits  CBC - Abnormal; Notable for the following components:   Hemoglobin 10.9 (*)    HCT 33.3 (*)    All other components within normal limits  HEPATIC FUNCTION PANEL - Abnormal; Notable for the following components:   Total Protein 5.9 (*)    Albumin 2.8 (*)    All other components within normal limits  LIPASE, BLOOD - Abnormal; Notable for the following components:   Lipase 52 (*)    All other components within normal limits  D-DIMER, QUANTITATIVE - Abnormal; Notable for the following components:   D-Dimer, Quant 1.83 (*)    All other components within normal limits  TROPONIN I (HIGH SENSITIVITY) - Abnormal; Notable for the following components:   Troponin I (High Sensitivity) 73 (*)    All other components within normal limits  URINALYSIS, ROUTINE W REFLEX MICROSCOPIC  BRAIN NATRIURETIC PEPTIDE  TROPONIN I (HIGH SENSITIVITY)    EKG EKG Interpretation  Date/Time:  Saturday November 30 2021 05:44:35 EDT Ventricular Rate:  72 PR Interval:  150 QRS Duration: 98 QT Interval:  358 QTC Calculation: 392 R Axis:   38 Text Interpretation: Sinus rhythm Borderline T wave abnormalities Confirmed by Zadie Rhine ([id number]) on 11/30/2021 5:50:30 AM  Radiology DG Chest 2 View  Result Date: 11/30/2021 CLINICAL DATA:  Chest pain EXAM: CHEST - 2 VIEW COMPARISON:  None Available. FINDINGS: Cardiac and mediastinal contours are within normal limits. Atherosclerotic calcifications in the transverse aorta. Mild background bronchitic changes and interstitial prominence. No pneumothorax or pleural effusion. No acute osseous abnormality. IMPRESSION: 1. Diffuse mild bronchitic changes and interstitial prominence may reflect acute or chronic bronchitis. 2. Aortic atherosclerotic vascular calcifications. Electronically Signed   By: Malachy Moan M.D.   On: 11/30/2021 07:12    CT Head Wo Contrast  Result Date: 11/30/2021 CLINICAL DATA:  Mental status changes, syncope EXAM: CT HEAD WITHOUT CONTRAST TECHNIQUE: Contiguous axial images were obtained from the base of the skull through the vertex without intravenous contrast. RADIATION DOSE REDUCTION: This exam was performed according to the departmental dose-optimization program which includes automated exposure control, adjustment of the mA and/or kV according to patient size and/or use of iterative reconstruction technique. COMPARISON:  Prior CT scan of the head 10/27/2019 FINDINGS: Brain: No evidence of acute infarction, hemorrhage, hydrocephalus, extra-axial  collection or mass lesion/mass effect. Vascular: No hyperdense vessel or unexpected calcification. Skull: Normal. Negative for fracture or focal lesion. Sinuses/Orbits: No acute finding. 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 Other: None. IMPRESSION: 1. No acute intracranial abnormality. 2. Compared to prior imaging, new high attenuation material within the left globe. This presumably represents intra-ocular injection of silicone oil for treatment of retinal detachment. Electronically Signed   By: Malachy Moan M.D.   On: 11/30/2021 08:08   CT Angio Chest PE W and/or Wo Contrast  Result Date: 11/30/2021 CLINICAL DATA:  66 year old female with history of positive D-dimer. Evaluate for pulmonary embolism. EXAM: CT ANGIOGRAPHY CHEST WITH CONTRAST TECHNIQUE: Multidetector CT imaging of the chest was performed using the standard protocol during bolus administration of intravenous contrast. Multiplanar CT image reconstructions and MIPs were obtained to evaluate the vascular anatomy. RADIATION DOSE REDUCTION: This exam was performed according to the departmental dose-optimization program which includes automated exposure control, adjustment of the mA and/or kV  according to patient size and/or use of iterative reconstruction technique. CONTRAST:  OMNIPAQUE IOHEXOL 350 MG/ML SOLN COMPARISON:  No priors. FINDINGS: Cardiovascular: No filling defects within the pulmonary arterial tree to suggest pulmonary embolism. Heart size is borderline enlarged. There is no significant pericardial fluid, thickening or pericardial calcification. There is aortic atherosclerosis, as well as atherosclerosis of the great vessels of the mediastinum and the coronary arteries, including calcified atherosclerotic plaque in the left anterior descending and left circumflex coronary arteries. Mild calcifications of the aortic valve and mitral annulus. Mediastinum/Nodes: No pathologically enlarged mediastinal or hilar lymph nodes. Esophagus is unremarkable in appearance. No axillary lymphadenopathy. Lungs/Pleura: Patchy areas of ground-glass attenuation and mild septal thickening are noted throughout the lungs bilaterally. No confluent consolidative airspace disease. No pleural effusions. No definite suspicious appearing pulmonary nodules or masses are noted. Upper Abdomen: Unremarkable. Musculoskeletal: There are no aggressive appearing lytic or blastic lesions noted in the visualized portions of the skeleton. Review of the MIP images confirms the above findings. IMPRESSION: 1. No evidence of pulmonary embolism. 2. Areas of ground-glass attenuation and mild septal thickening noted in the lungs bilaterally. This could suggest mild interstitial pulmonary edema (particularly in light of the borderline cardiomegaly) in the setting of mild congestive heart failure, or could be indicative of interstitial lung disease. Further clinical evaluation is recommended. 3. Aortic atherosclerosis, in addition to two-vessel coronary artery disease. Please note that although the presence of coronary artery calcium documents the presence of coronary artery disease, the severity of this disease and any potential  stenosis cannot be assessed on this non-gated CT examination. Assessment for potential risk factor modification, dietary therapy or pharmacologic therapy may be warranted, if clinically indicated. 4. There are mild calcifications of the aortic valve and mitral annulus. Echocardiographic correlation for evaluation of potential valvular dysfunction may be warranted if clinically indicated. Aortic Atherosclerosis (ICD10-I70.0). Electronically Signed   By: Trudie Reed M.D.   On: 11/30/2021 08:16    Procedures .Critical Care Performed by: Virgina Norfolk, DO Authorized by: Virgina Norfolk, DO   Critical care provider statement:    Critical care time (minutes):  45   Critical care was necessary to treat or prevent imminent or life-threatening deterioration of the following conditions:  Cardiac failure   Critical care was time spent personally by me on the following activities:  Blood draw for specimens, development of treatment plan with patient or surrogate,  discussions with primary provider, evaluation of patient's response to treatment, interpretation of cardiac output measurements, obtaining history from patient or surrogate, ordering and performing treatments and interventions, ordering and review of laboratory studies, ordering and review of radiographic studies, pulse oximetry, re-evaluation of patient's condition and review of old charts   Care discussed with: admitting provider      Medications Ordered in ED Medications  ondansetron (ZOFRAN) injection 4 mg (4 mg Intravenous Not Given 11/30/21 0718)  morphine (PF) 2 MG/ML injection 2 mg (has no administration in time range)  iohexol (OMNIPAQUE) 350 MG/ML injection 100 mL (100 mLs Intravenous Contrast Given 11/30/21 0807)    ED Course/ Medical Decision Making/ A&P                           Medical Decision Making Amount and/or Complexity of Data Reviewed Labs: ordered. Radiology: ordered.  Risk Prescription drug management. Decision  regarding hospitalization.   Callyn Severtson is here with chest pain.  Normal vitals.  No fever.  Woke up with discomfort in her lower chest in the middle of the night.  Pain was so bad that she fell to the ground.  Maybe lost consciousness per family did not seem to be responsive but was uncomfortable.  No seizure activity.  Was not confused.  Patient has normal vitals she is overall well-appearing.  She has reproducible chest wall tenderness under both breasts.  She denies any abdominal pain, nausea, vomiting.  Has not had any cardiac issues in the past.  She has diabetes.  Heart score is 2.  She has no PE risk factors but will get D-dimer, troponin, CBC, CMP, lipase, chest x-ray, head CT.  Questionable if there was a syncopal event but suspect that this is likely secondary to pain.  Did not appear to have seizure activity.  She is neurologically intact.  No concern for stroke.  This could be with differential of acid reflux versus MSK problem versus ACS versus PE versus pneumonia versus pneumothorax.  We will get labs and imaging and reevaluate.  Overall after my review and interpretation of labs I am concerned for NSTEMI.  Initial troponin 12 but repeat 73.  Otherwise no significant anemia, electrolyte abnormality except for blood sugar of 231.  She is not in DKA.  Patient did have a positive D-dimer, did have a CT scan that was negative for PE.  Some nonspecific interstitial lung changes that could be edema.  She does have some trace edema in her legs.  She started with chest discomfort earlier this morning.  I am concerned could be NSTEMI, this could be volume overload.  She has not had echocardiogram or extensive cardiac work-up in the past.  CT scan did show two-vessel coronary artery disease as well.  Overall she did have some mild calcifications of her valves as well.  Talked with Dr. Antoine Poche with cardiology.  We will start heparin to treat for possible ACS but will likely need heart failure evaluation as  well.  To be admitted to their service for further work-up.  This chart was dictated using voice recognition software.  Despite best efforts to proofread,  errors can occur which can change the documentation meaning.         Final Clinical Impression(s) / ED Diagnoses Final diagnoses:  NSTEMI (non-ST elevated myocardial infarction) (HCC)    Rx / DC Orders ED Discharge Orders     None  Virgina NorfolkCuratolo, Sohrab Keelan, DO 11/30/21 1001

## 2021-11-30 NOTE — Progress Notes (Signed)
  Echocardiogram 2D Echocardiogram has been performed.  Karina Pierce 11/30/2021, 3:29 PM

## 2021-11-30 NOTE — H&P (Addendum)
Cardiology Admission History and Physical:   Patient ID: Karina Pierce MRN: [id number]; DOB: [birth year]   Admission date: 11/30/2021  PCP:  Patient, No Pcp Per (Inactive)   [place] Providers Cardiologist: New to [facility]  Chief Complaint: Chest Pain  Patient Profile:   Karina Pierce is a 66 y.o. female with past medical history of Type 2 DM, former tobacco use and family history of CAD who is being seen 11/30/2021 for the evaluation of chest pain and elevated troponin values at the request of Dr. Ronnald Nian.  History of Present Illness:   Karina Pierce presented to [facility] ED on 11/30/2021 for evaluation of chest pain which started earlier this morning and awoke Karina from sleep. In talking with the patient and Karina daughter today, she reports she has noticed an occasional episode of a dull aching pain along Karina left pectoral region for the past week but thought this was possibly secondary to the skin abscess on Karina chest (reports this has been present for 8+ months and appears most consistent with a sebaceous cyst). This morning, she was awoken from sleep with a more severe chest pain and reports she was diaphoretic at that time. She called out to Karina daughter for help and by the time Karina daughter arrived to the room, she had lost consciousness on the bed. Karina daughter started pressing on Karina chest and she came to.  Estimates LOC was less than 30 seconds.  She reports having baseline dyspnea on exertion but no specific orthopnea, PND or pitting edema.  Unaware of any personal history of CAD, CHF or cardiac arrhythmias. Reports Karina mother had CABG in Karina 47's. The patient is a former smoker but quit 30+ years ago.   Initial labs show WBC 9.9, Hgb 10.9 (no recent values for comparison but previously WNL in 10/2016) and platelets 371. Na+ 139, K+ 3.8 and creatinine 0.91. AST 16 and ALT 13. D-dimer 1.83. Initial Hs Troponin 12 with repeat of 73. BNP 51. EKG shows NSR, HR 72 with TWI along Leads I and AVL which  is new when compared to prior tracings from 10/2016. CXR showing mild bronchitic changes and aortic atherosclerosis. CT Head showed no acute intracranial abnormality. Was noted to have high-attenuation material within the left globe likely representing intra-ocular injection silicone oil treatment for retinal detachment. CTA showed no evidence of a PE but was noted to have areas of ground-glass attenuation and mild septal thickening which might suggest pulmonary edema or could be indicative of ILD. Also noted to have aortic atherosclerosis, 2-vessel CAD and mild calcifications of the aortic valve and mitral annulus.    Past Medical History:  Diagnosis Date   Diabetes mellitus without complication ([place])     Past Surgical History:  Procedure Laterality Date   KNEE SURGERY       Medications Prior to Admission: Prior to Admission medications   Medication Sig Start Date End Date Taking? Authorizing Provider  acetaminophen (TYLENOL) 500 MG tablet Take 1 tablet (500 mg total) by mouth every 6 (six) hours as needed. 10/18/20   Corena Herter, PA-C  amoxicillin-clavulanate (AUGMENTIN) 875-125 MG tablet Take 1 tablet by mouth every 12 (twelve) hours. 10/18/20   Corena Herter, PA-C  ibuprofen (ADVIL) 600 MG tablet Take 1 tablet (600 mg total) by mouth every 6 (six) hours as needed. 10/18/20   Corena Herter, PA-C  LUTEIN PO Take 1 tablet by mouth daily.    [provider]  metFORMIN (GLUCOPHAGE-XR) 500 MG 24  hr tablet Take 500 mg by mouth in the morning and at bedtime.    [provider]  oxyCODONE-acetaminophen (PERCOCET/ROXICET) 5-325 MG tablet Take 1 tablet by mouth every 6 (six) hours as needed. 01/14/20   Ward, Delice Bison, DO     Allergies:   No Known Allergies  Social History:   Social History   Socioeconomic History   Marital status: Single    Spouse name: Not on file   Number of children: Not on file   Years of education: Not on file   Highest education level: Not  on file  Occupational History   Not on file  Tobacco Use   Smoking status: Former   Smokeless tobacco: Never  Vaping Use   Vaping Use: Never used  Substance and Sexual Activity   Alcohol use: No   Drug use: No   Sexual activity: Not on file  Other Topics Concern   Not on file  Social History Narrative   Not on file   Social Determinants of Health   Financial Resource Strain: Not on file  Food Insecurity: Not on file  Transportation Needs: Not on file  Physical Activity: Not on file  Stress: Not on file  Social Connections: Not on file  Intimate Partner Violence: Not on file    Family History:   The patient's family history includes CAD in Karina mother.    ROS:  Please see the history of present illness.   All other ROS reviewed and negative.     Physical Exam/Data:   Vitals:   11/30/21 0930 11/30/21 0945 11/30/21 1030 11/30/21 1100  BP: 127/69 111/65 119/67 124/66  Pulse: 72 71 65 67  Resp: 15 18 14 18   Temp:      TempSrc:      SpO2: 98% 99% 100% 100%  Weight:      Height:       No intake or output data in the 24 hours ending 11/30/21 1154    11/30/2021    5:58 AM 10/18/2020    7:59 AM 01/14/2020    8:42 PM  Last 3 Weights  Weight (lbs) 250 lb 236 lb 233 lb  Weight (kg) 113.399 kg 107.049 kg 105.688 kg     Body mass index is 35.87 kg/m.  General:  Well nourished, well developed female appearing in no acute distress HEENT: normal Neck: no JVD Vascular: No carotid bruits; Distal pulses 2+ bilaterally   Cardiac:  normal S1, S2; RRR;  Lungs: scattered rhonchi.  Abd: soft, nontender, no hepatomegaly  Ext: no pitting edema Musculoskeletal:  No deformities, BUE and BLE strength normal and equal Skin: warm and dry. Area along left pectoral region which is erythematous and appears to be a sebaceous cyst with open comedone present.  Neuro:  CNs 2-12 intact, no focal abnormalities noted Psych:  Normal affect    EKG:  The ECG that was done was personally  reviewed and demonstrates NSR, HR 72 with TWI along Leads I and AVL which is new when compared to prior tracings from 10/2016.  Relevant CV Studies:  None on File.   Laboratory Data:  High Sensitivity Troponin:   Recent Labs  Lab 11/30/21 0551 11/30/21 0800  TROPONINIHS 12 73*      Chemistry Recent Labs  Lab 11/30/21 0551  NA 139  K 3.8  CL 107  CO2 24  GLUCOSE 231*  BUN 14  CREATININE 0.91  CALCIUM 9.0  GFRNONAA >60  ANIONGAP 8  Recent Labs  Lab 11/30/21 0551  PROT 5.9*  ALBUMIN 2.8*  AST 16  ALT 13  ALKPHOS 76  BILITOT 0.8   Lipids No results for input(s): CHOL, TRIG, HDL, LABVLDL, LDLCALC, CHOLHDL in the last 168 hours. Hematology Recent Labs  Lab 11/30/21 0551  WBC 9.9  RBC 3.94  HGB 10.9*  HCT 33.3*  MCV 84.5  MCH 27.7  MCHC 32.7  RDW 13.2  PLT 371   Thyroid No results for input(s): TSH, FREET4 in the last 168 hours. BNP Recent Labs  Lab 11/30/21 0551  BNP 51.9    DDimer  Recent Labs  Lab 11/30/21 0551  DDIMER 1.83*     Radiology/Studies:  DG Chest 2 View  Result Date: 11/30/2021 CLINICAL DATA:  Chest pain EXAM: CHEST - 2 VIEW COMPARISON:  None Available. FINDINGS: Cardiac and mediastinal contours are within normal limits. Atherosclerotic calcifications in the transverse aorta. Mild background bronchitic changes and interstitial prominence. No pneumothorax or pleural effusion. No acute osseous abnormality. IMPRESSION: 1. Diffuse mild bronchitic changes and interstitial prominence may reflect acute or chronic bronchitis. 2. Aortic atherosclerotic vascular calcifications. Electronically Signed   By: Jacqulynn Cadet M.D.   On: 11/30/2021 07:12   CT Head Wo Contrast  Result Date: 11/30/2021 CLINICAL DATA:  Mental status changes, syncope EXAM: CT HEAD WITHOUT CONTRAST TECHNIQUE: Contiguous axial images were obtained from the base of the skull through the vertex without intravenous contrast. RADIATION DOSE REDUCTION: This exam was  performed according to the departmental dose-optimization program which includes automated exposure control, adjustment of the mA and/or kV according to patient size and/or use of iterative reconstruction technique. COMPARISON:  Prior CT scan of the head 10/27/2019 FINDINGS: Brain: No evidence of acute infarction, hemorrhage, hydrocephalus, extra-axial collection or mass lesion/mass effect. Vascular: No hyperdense vessel or unexpected calcification. Skull: Normal. Negative for fracture or focal lesion. Sinuses/Orbits: No acute finding. 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 Other: None. IMPRESSION: 1. No acute intracranial abnormality. 2. Compared to prior imaging, new high attenuation material within the left globe. This presumably represents intra-ocular injection of silicone oil for treatment of retinal detachment. Electronically Signed   By: Jacqulynn Cadet M.D.   On: 11/30/2021 08:08   CT Angio Chest PE W and/or Wo Contrast  Result Date: 11/30/2021 CLINICAL DATA:  66 year old female with history of positive D-dimer. Evaluate for pulmonary embolism. EXAM: CT ANGIOGRAPHY CHEST WITH CONTRAST TECHNIQUE: Multidetector CT imaging of the chest was performed using the standard protocol during bolus administration of intravenous contrast. Multiplanar CT image reconstructions and MIPs were obtained to evaluate the vascular anatomy. RADIATION DOSE REDUCTION: This exam was performed according to the departmental dose-optimization program which includes automated exposure control, adjustment of the mA and/or kV according to patient size and/or use of iterative reconstruction technique. CONTRAST:  189mL OMNIPAQUE IOHEXOL 350 MG/ML SOLN COMPARISON:  No priors. FINDINGS: Cardiovascular: No filling defects within the pulmonary arterial tree to suggest pulmonary embolism. Heart size is borderline enlarged.  There is no significant pericardial fluid, thickening or pericardial calcification. There is aortic atherosclerosis, as well as atherosclerosis of the great vessels of the mediastinum and the coronary arteries, including calcified atherosclerotic plaque in the left anterior descending and left circumflex coronary arteries. Mild calcifications of the aortic valve and mitral annulus. Mediastinum/Nodes: No pathologically enlarged mediastinal or hilar lymph nodes. Esophagus is unremarkable in appearance. No axillary lymphadenopathy. Lungs/Pleura: Patchy areas  of ground-glass attenuation and mild septal thickening are noted throughout the lungs bilaterally. No confluent consolidative airspace disease. No pleural effusions. No definite suspicious appearing pulmonary nodules or masses are noted. Upper Abdomen: Unremarkable. Musculoskeletal: There are no aggressive appearing lytic or blastic lesions noted in the visualized portions of the skeleton. Review of the MIP images confirms the above findings. IMPRESSION: 1. No evidence of pulmonary embolism. 2. Areas of ground-glass attenuation and mild septal thickening noted in the lungs bilaterally. This could suggest mild interstitial pulmonary edema (particularly in light of the borderline cardiomegaly) in the setting of mild congestive heart failure, or could be indicative of interstitial lung disease. Further clinical evaluation is recommended. 3. Aortic atherosclerosis, in addition to two-vessel coronary artery disease. Please note that although the presence of coronary artery calcium documents the presence of coronary artery disease, the severity of this disease and any potential stenosis cannot be assessed on this non-gated CT examination. Assessment for potential risk factor modification, dietary therapy or pharmacologic therapy may be warranted, if clinically indicated. 4. There are mild calcifications of the aortic valve and mitral annulus. Echocardiographic  correlation for evaluation of potential valvular dysfunction may be warranted if clinically indicated. Aortic Atherosclerosis (ICD10-I70.0). Electronically Signed   By: Vinnie Langton M.D.   On: 11/30/2021 08:16     Assessment and Plan:   1. Chest Pain/Elevated Troponin concerning for Possible NSTEMI - Presents with a 1-week history of chest pain but progressive symptoms this AM which awoke Karina from sleep. Pain has been waxing and waning since but currently atypical as worse with inspiration. - Initial Hs Troponin 12 with repeat of 73 and d-dimer was elevated but CTA negative for PE but did show coronary calcifications and valve abnormalities as described above. Echo pending. Will check FLP and Hgb A1c. Will also add-on a repeat Hs Troponin to Karina next set of labs.  - She has been started on IV Heparin. Received IV Morphine with no improvement in Karina pain. Will order NTG patch and can switch to a drip if needed but would be cautious given Karina BP. Will order ASA 81mg  daily, Lopressor 12.5mg  BID and Crestor 40mg  daily.  - Would await echo results and repeat Hs Troponin values but anticipate she will need a cardiac catheterization for definitive evaluation given Karina presentation, risk factors and enzymes trending upwards. Will review further with MD.   2. Abnormal Chest CT - CTA showed no evidence of a PE but was noted to have areas of ground-glass attenuation and mild septal thickening which might suggest pulmonary edema or could be indicative of ILD.  - BNP normal at 51 on admission. Will order 20mg  IV Lasix and assess response. She reports intermittent wheezing at home and previously used a nebulizer as needed but has been without this. Will order PRN Albuterol.   3. Syncope - Reports a syncopal episode and Karina daughter initially thought she was not breathing but quickly arousable afterwards. Overall LOC less than 30 seconds. She has been in NSR on telemetry with HR in the 60's to 70's and no  significant arrhythmias noted thus far. Continue to follow on telemetry.   4. Type 2 DM - She reports taking Metformin and Glipizide sporadically as she does not feel well if Karina glucose is "too low". Will check an Hgb A1c. Order SSI this admission.   5. Anemia - Hgb at 10.9 this admission and no recent values for comparison. No reports of active bleeding. Continue to follow.  Risk Assessment/Risk Scores:    TIMI Risk Score for Unstable Angina or Non-ST Elevation MI:   The patient's TIMI risk score is 4, which indicates a 20% risk of all cause mortality, new or recurrent myocardial infarction or need for urgent revascularization in the next 14 days.{  The appropriate patient status for this patient is OBSERVATION. Observation status is judged to be reasonable and necessary in order to provide the required intensity of service to ensure the patient's safety. The patient's presenting symptoms, physical exam findings, and initial radiographic and laboratory data in the context of their medical condition is felt to place them at decreased risk for further clinical deterioration. Furthermore, it is anticipated that the patient will be medically stable for discharge from the hospital within 2 midnights of admission.    For questions or updates, please contact [place] Please consult [email or web address] for contact info under     Signed, Erma Heritage, PA-C  11/30/2021 11:54 AM   History and all data above reviewed.  Patient examined.  I agree with the findings as above.  The patient does have some stuttering chest with pain walking up to 19 stairs to Karina apartment over the past couple of days for weeks.  This morning she had severe pain and called to Karina daughter who was in the department and when Karina daughter came Karina Pierce was on the floor and she said not breathing.  She did brief chest compressions.  The patient said immediately prior to that she been having severe pain which she felt was  "behind the heart."  She had return of breathing and also woke up after only a few seconds.  She came to the emergency room for enzymes with a slightly elevated.  She is still having pain associated with Karina sternum Karina daughter did some compressions and also still having some "behind the heart."  Is difficult somewhat to quantify both.  She is never had this before.  She does have diabetes not well controlled.  She has had no new shortness of breath, PND or orthopnea.  She had no palpitations, presyncope or syncope.  She had no weight gain or edema.  The patient exam reveals COR: Regular rate and rhythm, no rubs, murmurs,  Lungs: Clear to auscultation bilaterally,  Abd: Positive bowel sounds, no rebound, guarding, Ext 2+ pulses, no edema..  All available labs, radiology testing, previous records reviewed. Agree with documented assessment and plan.  Chest discomfort: Consistent with unstable angina enzymes suggestive of a non-STEMI.  She will need cardiac catheterization.  We will do this electively if she can pain management.  She has no acute ST segment changes on Karina EKG.  I will start IV nitroglycerin placed on nitroglycerin paste.  Diabetes: She is now does not have a primary care doctor.  She will be on Glucophage of course holding for the catheterization is indicated.    I would also start [place] versus GLP-1 receptor agonist.     Minus Breeding  1:29 PM  11/30/2021

## 2021-11-30 NOTE — Progress Notes (Signed)
ANTICOAGULATION CONSULT NOTE  Pharmacy Consult for Heparin Indication: chest pain/ACS  No Known Allergies  Patient Measurements: Height: 5\' 10"  (177.8 cm) Weight: 113.4 kg (250 lb) IBW/kg (Calculated) : 68.5 Heparin Dosing Weight: 94 kg  Vital Signs: Temp: 98.3 F (36.8 C) (06/03 1330) Temp Source: Oral (06/03 1330) BP: 124/66 (06/03 1230) Pulse Rate: 59 (06/03 1230)  Labs: Recent Labs    11/30/21 0551 11/30/21 0800 11/30/21 1214 11/30/21 1610  HGB 10.9*  --   --   --   HCT 33.3*  --   --   --   PLT 371  --   --   --   LABPROT 11.6  --   --   --   INR 0.9  --   --   --   HEPARINUNFRC  --   --   --  0.66  CREATININE 0.91  --   --   --   TROPONINIHS 12 73* 1,244*  --      Estimated Creatinine Clearance: 85.3 mL/min (by C-G formula based on SCr of 0.91 mg/dL).   Medical History: Past Medical History:  Diagnosis Date   Diabetes mellitus without complication ([place])     Medications:  Medications Prior to Admission  Medication Sig Dispense Refill Last Dose   acetaminophen (TYLENOL) 500 MG tablet Take 1 tablet (500 mg total) by mouth every 6 (six) hours as needed. (Patient taking differently: Take 500 mg by mouth every 6 (six) hours as needed for moderate pain.) 30 tablet 0 Past Week   glipiZIDE (GLUCOTROL) 10 MG tablet Take 10 mg by mouth daily before breakfast.   11/29/2021   ibuprofen (ADVIL) 600 MG tablet Take 1 tablet (600 mg total) by mouth every 6 (six) hours as needed. (Patient taking differently: Take 600 mg by mouth every 6 (six) hours as needed for mild pain.) 30 tablet 0 Past Week   metFORMIN (GLUCOPHAGE-XR) 500 MG 24 hr tablet Take 500 mg by mouth in the morning and at bedtime.   11/29/2021   oxyCODONE-acetaminophen (PERCOCET/ROXICET) 5-325 MG tablet Take 1 tablet by mouth every 6 (six) hours as needed. (Patient taking differently: Take 1 tablet by mouth every 6 (six) hours as needed for moderate pain.) 10 tablet 0 Past Week   traMADol (ULTRAM) 50 MG tablet Take  50 mg by mouth every 6 (six) hours as needed for moderate pain.   Past Week    Scheduled:   [START ON 12/01/2021] aspirin EC  81 mg Oral Daily   insulin aspart  0-15 Units Subcutaneous TID WC   metoprolol tartrate  12.5 mg Oral BID   rosuvastatin  40 mg Oral q1800   Infusions:   heparin 1,300 Units/hr (11/30/21 1011)   nitroGLYCERIN 5 mcg/min (11/30/21 1556)   PRN:   Assessment: 25 yof with a history of T2DM presenting with chest pain and SOB. Heparin per pharmacy consult placed for chest pain/ACS.  Patient is not on anticoagulation prior to arrival.  Initial heparin level therapeutic at 0.66.  Goal of Therapy:  Heparin level 0.3-0.7 units/ml Monitor platelets by anticoagulation protocol: Yes   Plan:  Continue heparin 1300 units/h Daily heparin level and CBC  Arrie Senate, PharmD, [place], [facility] Clinical Pharmacist [phone] Please check AMION for all [facility] Pharmacy numbers 11/30/2021

## 2021-11-30 NOTE — Progress Notes (Signed)
ANTICOAGULATION CONSULT NOTE - Initial Consult  Pharmacy Consult for Heparin Indication: chest pain/ACS  No Known Allergies  Patient Measurements: Height: 5\' 10"  (177.8 cm) Weight: 113.4 kg (250 lb) IBW/kg (Calculated) : 68.5 Heparin Dosing Weight: 94 kg  Vital Signs: Temp: 98.4 F (36.9 C) (06/03 0545) Temp Source: Oral (06/03 0545) BP: 127/69 (06/03 0930) Pulse Rate: 72 (06/03 0930)  Labs: Recent Labs    11/30/21 0551 11/30/21 0800  HGB 10.9*  --   HCT 33.3*  --   PLT 371  --   CREATININE 0.91  --   TROPONINIHS 12 73*    Estimated Creatinine Clearance: 85.3 mL/min (by C-G formula based on SCr of 0.91 mg/dL).   Medical History: Past Medical History:  Diagnosis Date   Diabetes mellitus without complication ([facility])     Medications:  (Not in a hospital admission)  Scheduled:    morphine injection  2 mg Intravenous Once   ondansetron (ZOFRAN) IV  4 mg Intravenous Once   Infusions:  PRN:   Assessment: Karina Pierce with a history of T2DM presenting with chest pain and SOB. Heparin per pharmacy consult placed for chest pain/ACS.  Patient is not on anticoagulation prior to arrival.  Hgb 10.9; plt 371  Goal of Therapy:  Heparin level 0.3-0.7 units/ml Monitor platelets by anticoagulation protocol: Yes   Plan:  Give 4000 units bolus x 1 Start heparin infusion at 1300 units/hr Check anti-Xa level in 6 hours and daily while on heparin Continue to monitor H&H and platelets  77, PharmD, BCPS 11/30/2021 9:59 AM ED Clinical Pharmacist -  [phone]

## 2021-11-30 NOTE — ED Triage Notes (Signed)
BIB [facility] from home. Woke up from sleep with sever CP that starts in the center of her chest and radiates to bilat sides under breast area. Per pt to EMS she developed dizziness and SOB while trying to call her daughter and then the next thing she knew she being woke up by her daughter at her side. ASA 324mg , Nitro x 1, PIV 20ga in Lt hand

## 2021-11-30 NOTE — ED Notes (Signed)
Patient sleeping at this time. No acute distress.

## 2021-12-01 DIAGNOSIS — I200 Unstable angina: Secondary | ICD-10-CM | POA: Diagnosis not present

## 2021-12-01 DIAGNOSIS — Z7984 Long term (current) use of oral hypoglycemic drugs: Secondary | ICD-10-CM | POA: Diagnosis not present

## 2021-12-01 DIAGNOSIS — E119 Type 2 diabetes mellitus without complications: Secondary | ICD-10-CM | POA: Diagnosis not present

## 2021-12-01 DIAGNOSIS — Z8249 Family history of ischemic heart disease and other diseases of the circulatory system: Secondary | ICD-10-CM | POA: Diagnosis not present

## 2021-12-01 DIAGNOSIS — I214 Non-ST elevation (NSTEMI) myocardial infarction: Secondary | ICD-10-CM | POA: Diagnosis not present

## 2021-12-01 DIAGNOSIS — E785 Hyperlipidemia, unspecified: Secondary | ICD-10-CM | POA: Diagnosis not present

## 2021-12-01 DIAGNOSIS — Z79899 Other long term (current) drug therapy: Secondary | ICD-10-CM | POA: Diagnosis not present

## 2021-12-01 DIAGNOSIS — I2510 Atherosclerotic heart disease of native coronary artery without angina pectoris: Secondary | ICD-10-CM | POA: Diagnosis not present

## 2021-12-01 DIAGNOSIS — Z87891 Personal history of nicotine dependence: Secondary | ICD-10-CM | POA: Diagnosis not present

## 2021-12-01 DIAGNOSIS — D649 Anemia, unspecified: Secondary | ICD-10-CM | POA: Diagnosis not present

## 2021-12-01 DIAGNOSIS — I25110 Atherosclerotic heart disease of native coronary artery with unstable angina pectoris: Secondary | ICD-10-CM | POA: Diagnosis not present

## 2021-12-01 DIAGNOSIS — I10 Essential (primary) hypertension: Secondary | ICD-10-CM | POA: Diagnosis not present

## 2021-12-01 LAB — CBC
HCT: 33.3 % — ABNORMAL LOW (ref 36.0–46.0)
Hemoglobin: 10.8 g/dL — ABNORMAL LOW (ref 12.0–15.0)
MCH: 27.3 pg (ref 26.0–34.0)
MCHC: 32.4 g/dL (ref 30.0–36.0)
MCV: 84.1 fL (ref 80.0–100.0)
Platelets: 361 10*3/uL (ref 150–400)
RBC: 3.96 MIL/uL (ref 3.87–5.11)
RDW: 13.4 % (ref 11.5–15.5)
WBC: 8.7 10*3/uL (ref 4.0–10.5)
nRBC: 0 % (ref 0.0–0.2)

## 2021-12-01 LAB — GLUCOSE, CAPILLARY
Glucose-Capillary: 271 mg/dL — ABNORMAL HIGH (ref 70–99)
Glucose-Capillary: 159 mg/dL — ABNORMAL HIGH (ref 70–99)
Glucose-Capillary: 249 mg/dL — ABNORMAL HIGH (ref 70–99)
Glucose-Capillary: 185 mg/dL — ABNORMAL HIGH (ref 70–99)

## 2021-12-01 LAB — TROPONIN I (HIGH SENSITIVITY)
Troponin I (High Sensitivity): 4071 ng/L (ref <18)
Troponin I (High Sensitivity): 4489 ng/L (ref <18)

## 2021-12-01 LAB — LIPID PANEL
Cholesterol: 221 mg/dL — ABNORMAL HIGH (ref 0–200)
HDL: 47 mg/dL (ref >40)
LDL Cholesterol: 143 mg/dL — ABNORMAL HIGH (ref 0–99)
Total CHOL/HDL Ratio: 4.7 RATIO
Triglycerides: 153 mg/dL — ABNORMAL HIGH (ref <150)
VLDL: 31 mg/dL (ref 0–40)

## 2021-12-01 LAB — HEPARIN LEVEL (UNFRACTIONATED): Heparin Unfractionated: 0.55 IU/mL (ref 0.30–0.70)

## 2021-12-01 MED ORDER — SODIUM CHLORIDE 0.9 % WEIGHT BASED INFUSION
1.0000 mL/kg/h | INTRAVENOUS | Status: DC
  Administered 2021-12-02: 1 mL/kg/h via INTRAVENOUS

## 2021-12-01 MED ORDER — SODIUM CHLORIDE 0.9 % WEIGHT BASED INFUSION
3.0000 mL/kg/h | INTRAVENOUS | Status: DC
  Administered 2021-12-02: 3 mL/kg/h via INTRAVENOUS

## 2021-12-01 MED ORDER — SODIUM CHLORIDE 0.9% FLUSH
3.0000 mL | Freq: Two times a day (BID) | INTRAVENOUS | Status: DC
  Administered 2021-12-01 – 2021-12-03 (×4): 3 mL via INTRAVENOUS

## 2021-12-01 MED ORDER — SODIUM CHLORIDE 0.9 % IV SOLN: 250.0000 mL | INTRAVENOUS | Status: DC | PRN

## 2021-12-01 MED ORDER — SODIUM CHLORIDE 0.9% FLUSH: 3.0000 mL | INTRAVENOUS | Status: DC | PRN

## 2021-12-01 MED ORDER — ASPIRIN 81 MG PO CHEW
81.0000 mg | CHEWABLE_TABLET | ORAL | Status: AC
  Administered 2021-12-02: 81 mg via ORAL
  Filled 2021-12-01: qty 1

## 2021-12-01 MED ORDER — GLIPIZIDE 10 MG PO TABS
10.0000 mg | ORAL_TABLET | Freq: Every day | ORAL | Status: DC
  Filled 2021-12-01 (×3): qty 1

## 2021-12-01 NOTE — H&P (View-Only) (Signed)
Progress Note  Patient Name: Karina Pierce Date of Encounter: 12/01/2021  Primary Cardiologist:   None   Subjective   No chest pain.  No SOB.   Inpatient Medications    Scheduled Meds:  aspirin EC  81 mg Oral Daily   insulin aspart  0-15 Units Subcutaneous TID WC   metoprolol tartrate  12.5 mg Oral BID   rosuvastatin  40 mg Oral q1800   Continuous Infusions:  heparin 1,300 Units/hr (12/01/21 0234)   nitroGLYCERIN 5 mcg/min (11/30/21 1556)   PRN Meds: acetaminophen, albuterol, nitroGLYCERIN, ondansetron (ZOFRAN) IV   Vital Signs    Vitals:   11/30/21 1800 11/30/21 2000 12/01/21 0300 12/01/21 0542  BP:  116/69  115/63  Pulse:  72  75  Resp: 12 19  17   Temp:  98.6 F (37 C)  98.6 F (37 C)  TempSrc:  Oral  Oral  SpO2:  96%    Weight:   112.7 kg   Height:        Intake/Output Summary (Last 24 hours) at 12/01/2021 0929 Last data filed at 12/01/2021 0830 Gross per 24 hour  Intake 1351.62 ml  Output 400 ml  Net 951.62 ml   Filed Weights   11/30/21 0558 12/01/21 0300  Weight: 113.4 kg 112.7 kg    Telemetry    NSR, NSVT - Personally Reviewed  ECG    NA - Personally Reviewed  Physical Exam   GEN: No acute distress.   Neck: No  JVD Cardiac: RRR, no murmurs, rubs, or gallops.  Respiratory: Clear  to auscultation bilaterally. GI: Soft, nontender, non-distended  MS: No  edema; No deformity. Neuro:  Nonfocal  Psych: Normal affect   Labs    Chemistry Recent Labs  Lab 11/30/21 0551  NA 139  K 3.8  CL 107  CO2 24  GLUCOSE 231*  BUN 14  CREATININE 0.91  CALCIUM 9.0  PROT 5.9*  ALBUMIN 2.8*  AST 16  ALT 13  ALKPHOS 76  BILITOT 0.8  GFRNONAA >60  ANIONGAP 8     Hematology Recent Labs  Lab 11/30/21 0551 12/01/21 0302  WBC 9.9 8.7  RBC 3.94 3.96  HGB 10.9* 10.8*  HCT 33.3* 33.3*  MCV 84.5 84.1  MCH 27.7 27.3  MCHC 32.7 32.4  RDW 13.2 13.4  PLT 371 361    Cardiac EnzymesNo results for input(s): TROPONINI in the last 168 hours. No  results for input(s): TROPIPOC in the last 168 hours.   BNP Recent Labs  Lab 11/30/21 0551  BNP 51.9     DDimer  Recent Labs  Lab 11/30/21 0551  DDIMER 1.83*     Radiology    CT:    IMPRESSION: 1. No evidence of pulmonary embolism. 2. Areas of ground-glass attenuation and mild septal thickening noted in the lungs bilaterally. This could suggest mild interstitial pulmonary edema (particularly in light of the borderline cardiomegaly) in the setting of mild congestive heart failure, or could be indicative of interstitial lung disease. Further clinical evaluation is recommended. 3. Aortic atherosclerosis, in addition to two-vessel coronary artery disease. Please note that although the presence of coronary artery calcium documents the presence of coronary artery disease, the severity of this disease and any potential stenosis cannot be assessed on this non-gated CT examination. Assessment for potential risk factor modification, dietary therapy or pharmacologic therapy may be warranted, if clinically indicated. 4. There are mild calcifications of the aortic valve and mitral annulus. Echocardiographic correlation for evaluation of potential valvular  dysfunction may be warranted if clinically indicated.    Cardiac Studies   Echo:   1. Left ventricular ejection fraction, by estimation, is 60 to 65%. The  left ventricle has normal function. The left ventricle has no regional  wall motion abnormalities. Left ventricular diastolic parameters are  indeterminate.   2. Right ventricular systolic function is normal. The right ventricular  size is normal. There is mildly elevated pulmonary artery systolic  pressure. The estimated right ventricular systolic pressure is 39.8 mmHg.   3. The mitral valve is normal in structure. Mild mitral valve  regurgitation.   4. The aortic valve is tricuspid. Aortic valve regurgitation is trivial.  Aortic valve sclerosis is present, with no  evidence of aortic valve  stenosis.   5. The inferior vena cava is dilated in size with >50% respiratory  variability, suggesting right atrial pressure of 8 mmHg.    Patient Profile     66 y.o. female with past medical history of Type 2 DM, former tobacco use and family history of CAD who is being seen 11/30/2021 for the evaluation of chest pain and elevated troponin values at the request of Dr. Lockie Mola.  Assessment & Plan    Chest Pain/Elevated Troponin concerning for Possible NSTEMI:    Preserved LV function.  Plan left heart cath tomorrow.  Continue IV NTG and heparin today.     Abnormal Chest CT:  Question edema or ILD.  Will need follow up CT as an out patient.    Type 2 DM:   A1C is 10.0.  Continue metformin.  Restarted glipizide  Farxiga before discharge.    Anemia:   Hgb is unchanged.  No evidence of acute bleeding.   Dyslipidemia:  LDL 143.  Continue high dose statin.    For questions or updates, please contact [facility] Please consult [email or web address] for contact info under Cardiology/STEMI.   Signed, Rollene Rotunda, MD  12/01/2021, 9:29 AM

## 2021-12-01 NOTE — Care Management Obs Status (Signed)
MEDICARE OBSERVATION STATUS NOTIFICATION   Patient Details  Name: Keya Wynes MRN: [id number] Date of Birth: [birth year]   Medicare Observation Status Notification Given:  Yes    Bess Kinds, RN 12/01/2021, 12:59 PM

## 2021-12-01 NOTE — Progress Notes (Signed)
Progress Note  Patient Name: Karina Pierce Date of Encounter: 12/01/2021  Primary Cardiologist:   None   Subjective   No chest pain.  No SOB.   Inpatient Medications    Scheduled Meds:  aspirin EC  81 mg Oral Daily   insulin aspart  0-15 Units Subcutaneous TID WC   metoprolol tartrate  12.5 mg Oral BID   rosuvastatin  40 mg Oral q1800   Continuous Infusions:  heparin 1,300 Units/hr (12/01/21 0234)   nitroGLYCERIN 5 mcg/min (11/30/21 1556)   PRN Meds: acetaminophen, albuterol, nitroGLYCERIN, ondansetron (ZOFRAN) IV   Vital Signs    Vitals:   11/30/21 1800 11/30/21 2000 12/01/21 0300 12/01/21 0542  BP:  116/69  115/63  Pulse:  72  75  Resp: 12 19  17   Temp:  98.6 F (37 C)  98.6 F (37 C)  TempSrc:  Oral  Oral  SpO2:  96%    Weight:   112.7 kg   Height:        Intake/Output Summary (Last 24 hours) at 12/01/2021 0929 Last data filed at 12/01/2021 0830 Gross per 24 hour  Intake 1351.62 ml  Output 400 ml  Net 951.62 ml   Filed Weights   11/30/21 0558 12/01/21 0300  Weight: 113.4 kg 112.7 kg    Telemetry    NSR, NSVT - Personally Reviewed  ECG    NA - Personally Reviewed  Physical Exam   GEN: No acute distress.   Neck: No  JVD Cardiac: RRR, no murmurs, rubs, or gallops.  Respiratory: Clear  to auscultation bilaterally. GI: Soft, nontender, non-distended  MS: No  edema; No deformity. Neuro:  Nonfocal  Psych: Normal affect   Labs    Chemistry Recent Labs  Lab 11/30/21 0551  NA 139  K 3.8  CL 107  CO2 24  GLUCOSE 231*  BUN 14  CREATININE 0.91  CALCIUM 9.0  PROT 5.9*  ALBUMIN 2.8*  AST 16  ALT 13  ALKPHOS 76  BILITOT 0.8  GFRNONAA >60  ANIONGAP 8     Hematology Recent Labs  Lab 11/30/21 0551 12/01/21 0302  WBC 9.9 8.7  RBC 3.94 3.96  HGB 10.9* 10.8*  HCT 33.3* 33.3*  MCV 84.5 84.1  MCH 27.7 27.3  MCHC 32.7 32.4  RDW 13.2 13.4  PLT 371 361    Cardiac EnzymesNo results for input(s): TROPONINI in the last 168 hours. No  results for input(s): TROPIPOC in the last 168 hours.   BNP Recent Labs  Lab 11/30/21 0551  BNP 51.9     DDimer  Recent Labs  Lab 11/30/21 0551  DDIMER 1.83*     Radiology    CT:    IMPRESSION: 1. No evidence of pulmonary embolism. 2. Areas of ground-glass attenuation and mild septal thickening noted in the lungs bilaterally. This could suggest mild interstitial pulmonary edema (particularly in light of the borderline cardiomegaly) in the setting of mild congestive heart failure, or could be indicative of interstitial lung disease. Further clinical evaluation is recommended. 3. Aortic atherosclerosis, in addition to two-vessel coronary artery disease. Please note that although the presence of coronary artery calcium documents the presence of coronary artery disease, the severity of this disease and any potential stenosis cannot be assessed on this non-gated CT examination. Assessment for potential risk factor modification, dietary therapy or pharmacologic therapy may be warranted, if clinically indicated. 4. There are mild calcifications of the aortic valve and mitral annulus. Echocardiographic correlation for evaluation of potential valvular  dysfunction may be warranted if clinically indicated.    Cardiac Studies   Echo:   1. Left ventricular ejection fraction, by estimation, is 60 to 65%. The  left ventricle has normal function. The left ventricle has no regional  wall motion abnormalities. Left ventricular diastolic parameters are  indeterminate.   2. Right ventricular systolic function is normal. The right ventricular  size is normal. There is mildly elevated pulmonary artery systolic  pressure. The estimated right ventricular systolic pressure is 39.8 mmHg.   3. The mitral valve is normal in structure. Mild mitral valve  regurgitation.   4. The aortic valve is tricuspid. Aortic valve regurgitation is trivial.  Aortic valve sclerosis is present, with no  evidence of aortic valve  stenosis.   5. The inferior vena cava is dilated in size with >50% respiratory  variability, suggesting right atrial pressure of 8 mmHg.    Patient Profile     66 y.o. female with past medical history of Type 2 DM, former tobacco use and family history of CAD who is being seen 11/30/2021 for the evaluation of chest pain and elevated troponin values at the request of Dr. Lockie Mola.  Assessment & Plan    Chest Pain/Elevated Troponin concerning for Possible NSTEMI:    Preserved LV function.  Plan left heart cath tomorrow.  Continue IV NTG and heparin today.     Abnormal Chest CT:  Question edema or ILD.  Will need follow up CT as an out patient.    Type 2 DM:   A1C is 10.0.  Continue metformin.  Restarted glipizide  Farxiga before discharge.    Anemia:   Hgb is unchanged.  No evidence of acute bleeding.   Dyslipidemia:  LDL 143.  Continue high dose statin.    For questions or updates, please contact [facility] Please consult [email or web address] for contact info under Cardiology/STEMI.   Signed, Rollene Rotunda, MD  12/01/2021, 9:29 AM

## 2021-12-01 NOTE — Progress Notes (Signed)
ANTICOAGULATION CONSULT NOTE  Pharmacy Consult for Heparin Indication: chest pain/ACS  No Known Allergies  Patient Measurements: Height: 5\' 10"  (177.8 cm) Weight: 112.7 kg (248 lb 7.3 oz) IBW/kg (Calculated) : 68.5 Heparin Dosing Weight: 94 kg  Vital Signs: Temp: 98.6 F (37 C) (06/04 0542) Temp Source: Oral (06/04 0542) BP: 122/70 (06/04 1040) Pulse Rate: 75 (06/04 0542)  Labs: Recent Labs    11/30/21 0551 11/30/21 0800 11/30/21 1214 11/30/21 1610 12/01/21 0302 12/01/21 0711  HGB 10.9*  --   --   --  10.8*  --   HCT 33.3*  --   --   --  33.3*  --   PLT 371  --   --   --  361  --   LABPROT 11.6  --   --   --   --   --   INR 0.9  --   --   --   --   --   HEPARINUNFRC  --   --   --  0.66 0.55  --   CREATININE 0.91  --   --   --   --   --   TROPONINIHS 12   < > 1,244*  --  4,071* 4,489*   < > = values in this interval not displayed.     Estimated Creatinine Clearance: 85 mL/min (by C-G formula based on SCr of 0.91 mg/dL).   Medical History: Past Medical History:  Diagnosis Date   Diabetes mellitus without complication (HCC)     Medications:  Medications Prior to Admission  Medication Sig Dispense Refill Last Dose   acetaminophen (TYLENOL) 500 MG tablet Take 1 tablet (500 mg total) by mouth every 6 (six) hours as needed. (Patient taking differently: Take 500 mg by mouth every 6 (six) hours as needed for moderate pain.) 30 tablet 0 Past Week   glipiZIDE (GLUCOTROL) 10 MG tablet Take 10 mg by mouth daily before breakfast.   11/29/2021   ibuprofen (ADVIL) 600 MG tablet Take 1 tablet (600 mg total) by mouth every 6 (six) hours as needed. (Patient taking differently: Take 600 mg by mouth every 6 (six) hours as needed for mild pain.) 30 tablet 0 Past Week   metFORMIN (GLUCOPHAGE-XR) 500 MG 24 hr tablet Take 500 mg by mouth in the morning and at bedtime.   11/29/2021   oxyCODONE-acetaminophen (PERCOCET/ROXICET) 5-325 MG tablet Take 1 tablet by mouth every 6 (six) hours as  needed. (Patient taking differently: Take 1 tablet by mouth every 6 (six) hours as needed for moderate pain.) 10 tablet 0 Past Week   traMADol (ULTRAM) 50 MG tablet Take 50 mg by mouth every 6 (six) hours as needed for moderate pain.   Past Week    Scheduled:   [START ON 12/02/2021] aspirin  81 mg Oral Pre-Cath   aspirin EC  81 mg Oral Daily   [START ON 12/02/2021] glipiZIDE  10 mg Oral QAC breakfast   insulin aspart  0-15 Units Subcutaneous TID WC   metoprolol tartrate  12.5 mg Oral BID   rosuvastatin  40 mg Oral q1800   sodium chloride flush  3 mL Intravenous Q12H   Infusions:   sodium chloride     [START ON 12/02/2021] sodium chloride     Followed by   02/01/2022 ON 12/02/2021] sodium chloride     heparin 1,300 Units/hr (12/01/21 0234)   nitroGLYCERIN 5 mcg/min (11/30/21 1556)   PRN:   Assessment: 66 yof with a history of T2DM  presenting with chest pain and SOB. Heparin per pharmacy consult placed for chest pain/ACS. Patient is not on anticoagulation prior to arrival. Plan is for [facility] tomorrow.  Heparin level this AM 0.55 and within goal. Hgb and platelets are stable and within normal limits. No signs of bleeding noted.  Goal of Therapy:  Heparin level 0.3-0.7 units/ml Monitor platelets by anticoagulation protocol: Yes   Plan:  Continue IV heparin gtt @ 1300 units/h Daily heparin level and CBC Monitor for signs and symptoms of bleeding  Thank you for involving pharmacy in this patient's care.  Arnette Felts, PharmD PGY1 Ambulatory Care Pharmacy Resident 12/01/2021 11:32 AM  **Pharmacist phone directory can be found on [email or web address] listed under [facility]*

## 2021-12-01 NOTE — Progress Notes (Signed)
Patient watched cath video 

## 2021-12-01 NOTE — Progress Notes (Incomplete)
Progress Note  Patient Name: Karina Pierce Date of Encounter: 12/02/2021  [facility] Cardiologist: None Hochrein  Subjective   No trouble overnight. In cath lab.  Inpatient Medications    Scheduled Meds:  [MAR Hold] aspirin EC  81 mg Oral Daily   [MAR Hold] glipiZIDE  10 mg Oral QAC breakfast   [MAR Hold] insulin aspart  0-15 Units Subcutaneous TID WC   [MAR Hold] metoprolol tartrate  12.5 mg Oral BID   [MAR Hold] rosuvastatin  40 mg Oral q1800   [MAR Hold] sodium chloride flush  3 mL Intravenous Q12H   Continuous Infusions:  sodium chloride     sodium chloride 1 mL/kg/hr (12/02/21 0424)   heparin 1,300 Units/hr (12/01/21 2112)   [MAR Hold] nitroGLYCERIN 5 mcg/min (11/30/21 1556)   PRN Meds: sodium chloride, [MAR Hold] acetaminophen, [MAR Hold] albuterol, Heparin (Porcine) in NaCl, [MAR Hold] nitroGLYCERIN, [MAR Hold] ondansetron (ZOFRAN) IV, sodium chloride flush   Vital Signs    Vitals:   12/01/21 1040 12/01/21 1602 12/01/21 2129 12/02/21 0427  BP: 122/70 129/68 (!) 148/70 138/73  Pulse:    85  Resp: 13 14 18 14   Temp:  99 F (37.2 C) 98.3 F (36.8 C) 99 F (37.2 C)  TempSrc:  Oral Oral Oral  SpO2:    95%  Weight:    109.5 kg  Height:        Intake/Output Summary (Last 24 hours) at 12/02/2021 0746 Last data filed at 12/01/2021 0830 Gross per 24 hour  Intake --  Output 200 ml  Net -200 ml      12/02/2021    4:27 AM 12/01/2021    3:00 AM 11/30/2021    5:58 AM  Last 3 Weights  Weight (lbs) 241 lb 8 oz 248 lb 7.3 oz 250 lb  Weight (kg) 109.544 kg 112.7 kg 113.399 kg      Telemetry    NSR - Personally Reviewed  ECG    NSR with NSTWA. No acute abnormality.- Personally Reviewed  Physical Exam  No distress. Unchanged from described.  Labs    High Sensitivity Troponin:   Recent Labs  Lab 11/30/21 0551 11/30/21 0800 11/30/21 1214 12/01/21 0302 12/01/21 0711  TROPONINIHS 12 73* 1,244* 4,071* 4,489*     Chemistry Recent Labs  Lab 11/30/21 0551   NA 139  K 3.8  CL 107  CO2 24  GLUCOSE 231*  BUN 14  CREATININE 0.91  CALCIUM 9.0  PROT 5.9*  ALBUMIN 2.8*  AST 16  ALT 13  ALKPHOS 76  BILITOT 0.8  GFRNONAA >60  ANIONGAP 8    Lipids  Recent Labs  Lab 12/01/21 0302  CHOL 221*  TRIG 153*  HDL 47  LDLCALC 143*  CHOLHDL 4.7    Hematology Recent Labs  Lab 11/30/21 0551 12/01/21 0302  WBC 9.9 8.7  RBC 3.94 3.96  HGB 10.9* 10.8*  HCT 33.3* 33.3*  MCV 84.5 84.1  MCH 27.7 27.3  MCHC 32.7 32.4  RDW 13.2 13.4  PLT 371 361   Thyroid No results for input(s): TSH, FREET4 in the last 168 hours.  BNP Recent Labs  Lab 11/30/21 0551  BNP 51.9    DDimer  Recent Labs  Lab 11/30/21 0551  DDIMER 1.83*     Radiology    CT Head Wo Contrast  Result Date: 11/30/2021 CLINICAL DATA:  Mental status changes, syncope EXAM: CT HEAD WITHOUT CONTRAST TECHNIQUE: Contiguous axial images were obtained from the base of the skull through  the vertex without intravenous contrast. RADIATION DOSE REDUCTION: This exam was performed according to the departmental dose-optimization program which includes automated exposure control, adjustment of the mA and/or kV according to patient size and/or use of iterative reconstruction technique. COMPARISON:  Prior CT scan of the head 10/27/2019 FINDINGS: Brain: No evidence of acute infarction, hemorrhage, hydrocephalus, extra-axial collection or mass lesion/mass effect. Vascular: No hyperdense vessel or unexpected calcification. Skull: Normal. Negative for fracture or focal lesion. Sinuses/Orbits: No acute finding. 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 Other: None. IMPRESSION: 1. No acute intracranial abnormality. 2. Compared to prior imaging, new high attenuation material within the left globe. This presumably represents intra-ocular injection of silicone oil for treatment of retinal  detachment. Electronically Signed   By: Malachy MoanHeath  McCullough M.D.   On: 11/30/2021 08:08   CT Angio Chest PE W and/or Wo Contrast  Result Date: 11/30/2021 CLINICAL DATA:  66 year old female with history of positive D-dimer. Evaluate for pulmonary embolism. EXAM: CT ANGIOGRAPHY CHEST WITH CONTRAST TECHNIQUE: Multidetector CT imaging of the chest was performed using the standard protocol during bolus administration of intravenous contrast. Multiplanar CT image reconstructions and MIPs were obtained to evaluate the vascular anatomy. RADIATION DOSE REDUCTION: This exam was performed according to the departmental dose-optimization program which includes automated exposure control, adjustment of the mA and/or kV according to patient size and/or use of iterative reconstruction technique. CONTRAST:  100mL OMNIPAQUE IOHEXOL 350 MG/ML SOLN COMPARISON:  No priors. FINDINGS: Cardiovascular: No filling defects within the pulmonary arterial tree to suggest pulmonary embolism. Heart size is borderline enlarged. There is no significant pericardial fluid, thickening or pericardial calcification. There is aortic atherosclerosis, as well as atherosclerosis of the great vessels of the mediastinum and the coronary arteries, including calcified atherosclerotic plaque in the left anterior descending and left circumflex coronary arteries. Mild calcifications of the aortic valve and mitral annulus. Mediastinum/Nodes: No pathologically enlarged mediastinal or hilar lymph nodes. Esophagus is unremarkable in appearance. No axillary lymphadenopathy. Lungs/Pleura: Patchy areas of ground-glass attenuation and mild septal thickening are noted throughout the lungs bilaterally. No confluent consolidative airspace disease. No pleural effusions. No definite suspicious appearing pulmonary nodules or masses are noted. Upper Abdomen: Unremarkable. Musculoskeletal: There are no aggressive appearing lytic or blastic lesions noted in the visualized portions  of the skeleton. Review of the MIP images confirms the above findings. IMPRESSION: 1. No evidence of pulmonary embolism. 2. Areas of ground-glass attenuation and mild septal thickening noted in the lungs bilaterally. This could suggest mild interstitial pulmonary edema (particularly in light of the borderline cardiomegaly) in the setting of mild congestive heart failure, or could be indicative of interstitial lung disease. Further clinical evaluation is recommended. 3. Aortic atherosclerosis, in addition to two-vessel coronary artery disease. Please note that although the presence of coronary artery calcium documents the presence of coronary artery disease, the severity of this disease and any potential stenosis cannot be assessed on this non-gated CT examination. Assessment for potential risk factor modification, dietary therapy or pharmacologic therapy may be warranted, if clinically indicated. 4. There are mild calcifications of the aortic valve and mitral annulus. Echocardiographic correlation for evaluation of potential valvular dysfunction may be warranted if clinically indicated. Aortic Atherosclerosis (ICD10-I70.0). Electronically Signed   By: Trudie Reedaniel  Entrikin M.D.   On: 11/30/2021 08:16   ECHOCARDIOGRAM COMPLETE  Result Date: 11/30/2021    ECHOCARDIOGRAM REPORT   Patient Name:   Karina Pierce  Date of Exam: 11/30/2021 Medical Rec #:  [id number]  Height:       70.0 in Accession #:    [id number] Weight:       250.0 lb Date of Birth:  May 11, 1956  BSA:          2.295 m Patient Age:    66 years   BP:           124/66 mmHg Patient Gender: F          HR:           70 bpm. Exam Location:  Inpatient Procedure: 2D Echo Indications:     chest pain  History:         Patient has no prior history of Echocardiogram examinations.  Sonographer:     Delcie Roch RDCS Referring Phys:  [id number] Ellsworth Lennox Diagnosing Phys: Epifanio Lesches MD IMPRESSIONS  1. Left ventricular ejection fraction, by estimation, is 60  to 65%. The left ventricle has normal function. The left ventricle has no regional wall motion abnormalities. Left ventricular diastolic parameters are indeterminate.  2. Right ventricular systolic function is normal. The right ventricular size is normal. There is mildly elevated pulmonary artery systolic pressure. The estimated right ventricular systolic pressure is 39.8 mmHg.  3. The mitral valve is normal in structure. Mild mitral valve regurgitation.  4. The aortic valve is tricuspid. Aortic valve regurgitation is trivial. Aortic valve sclerosis is present, with no evidence of aortic valve stenosis.  5. The inferior vena cava is dilated in size with >50% respiratory variability, suggesting right atrial pressure of 8 mmHg. FINDINGS  Left Ventricle: Left ventricular ejection fraction, by estimation, is 60 to 65%. The left ventricle has normal function. The left ventricle has no regional wall motion abnormalities. The left ventricular internal cavity size was normal in size. There is  no left ventricular hypertrophy. Left ventricular diastolic parameters are indeterminate. Right Ventricle: The right ventricular size is normal. No increase in right ventricular wall thickness. Right ventricular systolic function is normal. There is mildly elevated pulmonary artery systolic pressure. The tricuspid regurgitant velocity is 2.82  m/s, and with an assumed right atrial pressure of 8 mmHg, the estimated right ventricular systolic pressure is 39.8 mmHg. Left Atrium: Left atrial size was normal in size. Right Atrium: Right atrial size was normal in size. Pericardium: There is no evidence of pericardial effusion. Mitral Valve: The mitral valve is normal in structure. Mild mitral valve regurgitation. Tricuspid Valve: The tricuspid valve is normal in structure. Tricuspid valve regurgitation is trivial. Aortic Valve: The aortic valve is tricuspid. Aortic valve regurgitation is trivial. Aortic valve sclerosis is present, with no  evidence of aortic valve stenosis. Pulmonic Valve: The pulmonic valve was not well visualized. Pulmonic valve regurgitation is trivial. Aorta: The aortic root and ascending aorta are structurally normal, with no evidence of dilitation. Venous: The inferior vena cava is dilated in size with greater than 50% respiratory variability, suggesting right atrial pressure of 8 mmHg. IAS/Shunts: The interatrial septum was not well visualized.  LEFT VENTRICLE PLAX 2D LVIDd:         5.40 cm Diastology LVIDs:         3.30 cm LV e' medial:  9.00 cm/s LV PW:         0.80 cm LV e' lateral: 10.30 cm/s LV IVS:        0.80 cm  RIGHT VENTRICLE             IVC RV  S prime:     14.20 cm/s  IVC diam: 2.30 cm TAPSE (M-mode): 2.1 cm LEFT ATRIUM             Index        RIGHT ATRIUM           Index LA diam:        3.60 cm 1.57 cm/m   RA Area:     14.90 cm LA Vol (A2C):   58.9 ml 25.66 ml/m  RA Volume:   37.40 ml  16.30 ml/m LA Vol (A4C):   59.5 ml 25.93 ml/m LA Biplane Vol: 61.0 ml 26.58 ml/m  AORTIC VALVE LVOT Vmax:   89.70 cm/s LVOT Vmean:  58.600 cm/s LVOT VTI:    0.212 m  AORTA Ao Root diam: 2.70 cm Ao Asc diam:  3.10 cm MR Peak grad:    88.0 mmHg    TRICUSPID VALVE MR Mean grad:    64.0 mmHg    TR Peak grad:   31.8 mmHg MR Vmax:         469.00 cm/s  TR Vmax:        282.00 cm/s MR Vmean:        382.0 cm/s MR PISA:         0.57 cm     SHUNTS MR PISA Eff ROA: 5 mm        Systemic VTI: 0.21 m MR PISA Radius:  0.30 cm Epifanio Lesches MD Electronically signed by Epifanio Lesches MD Signature Date/Time: 11/30/2021/6:07:05 PM    Final (Updated)     Cardiac Studies   See echo report above. Cardiac cath is pending.  Patient Profile     66 y.o. female with past medical history of Type 2 DM, former tobacco use and family history of CAD who is being seen 11/30/2021 for the evaluation of chest pain and elevated troponin values.   Assessment & Plan    NSTEMI: Cath this AM to define anatomy and guide therapy. Hyperlipidemia:  LDL target < 55 < 55 DM II: A1C target < 7.0. Start SGLT-2 CAD based on + CAC: CT diagnosis.  For questions or updates, please contact [facility] Please consult [email or web address] for contact info under    Late entry: Had successful PCI this AM. Images reviewed. Rehab teaching and ambulation with DC later today. Patient prefers tomorrow. Educated about [facility].    Signed, Lesleigh Noe, MD  12/02/2021, 7:46 AM

## 2021-12-01 NOTE — Progress Notes (Signed)
Patient did not want to take the new medications that were prescribed today. Metoprolol and Crestor refused.

## 2021-12-02 ENCOUNTER — Other Ambulatory Visit (HOSPITAL_COMMUNITY): Payer: Self-pay

## 2021-12-02 ENCOUNTER — Encounter (HOSPITAL_COMMUNITY): Payer: Self-pay | Admitting: Cardiovascular Disease

## 2021-12-02 ENCOUNTER — Encounter (HOSPITAL_COMMUNITY)
Admission: EM | Disposition: A | Discharge status: Home / Self Care | Payer: Self-pay | Source: Home / Self Care | Attending: Cardiology

## 2021-12-02 DIAGNOSIS — I10 Essential (primary) hypertension: Secondary | ICD-10-CM

## 2021-12-02 DIAGNOSIS — I2510 Atherosclerotic heart disease of native coronary artery without angina pectoris: Secondary | ICD-10-CM

## 2021-12-02 DIAGNOSIS — E785 Hyperlipidemia, unspecified: Secondary | ICD-10-CM

## 2021-12-02 HISTORY — PX: CORONARY STENT INTERVENTION: CATH118234

## 2021-12-02 HISTORY — PX: LEFT HEART CATH AND CORONARY ANGIOGRAPHY: CATH118249

## 2021-12-02 LAB — POCT ACTIVATED CLOTTING TIME: Activated Clotting Time: 323 seconds

## 2021-12-02 LAB — GLUCOSE, CAPILLARY
Glucose-Capillary: 279 mg/dL — ABNORMAL HIGH (ref 70–99)
Glucose-Capillary: 233 mg/dL — ABNORMAL HIGH (ref 70–99)
Glucose-Capillary: 271 mg/dL — ABNORMAL HIGH (ref 70–99)

## 2021-12-02 LAB — HEPARIN LEVEL (UNFRACTIONATED): Heparin Unfractionated: 0.41 IU/mL (ref 0.30–0.70)

## 2021-12-02 SURGERY — LEFT HEART CATH AND CORONARY ANGIOGRAPHY
Anesthesia: LOCAL

## 2021-12-02 MED ORDER — TRAMADOL HCL 50 MG PO TABS
50.0000 mg | ORAL_TABLET | Freq: Once | ORAL | Status: AC
  Administered 2021-12-02: 50 mg via ORAL
  Filled 2021-12-02 (×2): qty 1

## 2021-12-02 MED ORDER — VERAPAMIL HCL 2.5 MG/ML IV SOLN
INTRAVENOUS | Status: AC
  Filled 2021-12-02: qty 2

## 2021-12-02 MED ORDER — SODIUM CHLORIDE 0.9% FLUSH: 3.0000 mL | Freq: Two times a day (BID) | INTRAVENOUS | Status: DC

## 2021-12-02 MED ORDER — LABETALOL HCL 5 MG/ML IV SOLN: 10.0000 mg | INTRAVENOUS | Status: AC | PRN

## 2021-12-02 MED ORDER — HEPARIN (PORCINE) IN NACL 1000-0.9 UT/500ML-% IV SOLN
INTRAVENOUS | Status: AC
  Filled 2021-12-02: qty 1000

## 2021-12-02 MED ORDER — TICAGRELOR 90 MG PO TABS
ORAL_TABLET | ORAL | Status: DC | PRN
  Administered 2021-12-02: 180 mg via ORAL

## 2021-12-02 MED ORDER — SODIUM CHLORIDE 0.9 % IV SOLN: 250.0000 mL | INTRAVENOUS | Status: DC | PRN

## 2021-12-02 MED ORDER — SODIUM CHLORIDE 0.9% FLUSH: 3.0000 mL | INTRAVENOUS | Status: DC | PRN

## 2021-12-02 MED ORDER — LIDOCAINE HCL (PF) 1 % IJ SOLN
INTRAMUSCULAR | Status: DC | PRN
  Administered 2021-12-02: 2 mL

## 2021-12-02 MED ORDER — HEPARIN SODIUM (PORCINE) 1000 UNIT/ML IJ SOLN
INTRAMUSCULAR | Status: DC | PRN
  Administered 2021-12-02: 6000 [IU] via INTRAVENOUS
  Administered 2021-12-02: 5000 [IU] via INTRAVENOUS

## 2021-12-02 MED ORDER — HEPARIN SODIUM (PORCINE) 1000 UNIT/ML IJ SOLN
INTRAMUSCULAR | Status: AC
  Filled 2021-12-02: qty 10

## 2021-12-02 MED ORDER — TICAGRELOR 90 MG PO TABS
90.0000 mg | ORAL_TABLET | Freq: Two times a day (BID) | ORAL | Status: DC
  Administered 2021-12-02: 90 mg via ORAL
  Filled 2021-12-02: qty 1

## 2021-12-02 MED ORDER — IOHEXOL 350 MG/ML SOLN
INTRAVENOUS | Status: DC | PRN
  Administered 2021-12-02: 150 mL

## 2021-12-02 MED ORDER — ATORVASTATIN CALCIUM 80 MG PO TABS: 80.0000 mg | ORAL_TABLET | Freq: Every day | ORAL | Status: DC

## 2021-12-02 MED ORDER — ONDANSETRON HCL 4 MG/2ML IJ SOLN: 4.0000 mg | Freq: Four times a day (QID) | INTRAMUSCULAR | Status: DC | PRN

## 2021-12-02 MED ORDER — LIDOCAINE HCL (PF) 1 % IJ SOLN
INTRAMUSCULAR | Status: AC
  Filled 2021-12-02: qty 30

## 2021-12-02 MED ORDER — SODIUM CHLORIDE 0.9 % IV SOLN: INTRAVENOUS | Status: AC

## 2021-12-02 MED ORDER — HYDRALAZINE HCL 20 MG/ML IJ SOLN: 10.0000 mg | INTRAMUSCULAR | Status: AC | PRN

## 2021-12-02 MED ORDER — MIDAZOLAM HCL 2 MG/2ML IJ SOLN
INTRAMUSCULAR | Status: AC
  Filled 2021-12-02: qty 2

## 2021-12-02 MED ORDER — VERAPAMIL HCL 2.5 MG/ML IV SOLN
INTRA_ARTERIAL | Status: DC | PRN
  Administered 2021-12-02: 5 mL via INTRA_ARTERIAL

## 2021-12-02 MED ORDER — ACETAMINOPHEN 325 MG PO TABS
650.0000 mg | ORAL_TABLET | ORAL | Status: DC | PRN
  Administered 2021-12-02: 650 mg via ORAL
  Filled 2021-12-02: qty 2

## 2021-12-02 MED ORDER — NITROGLYCERIN 1 MG/10 ML FOR IR/CATH LAB
INTRA_ARTERIAL | Status: AC
  Filled 2021-12-02: qty 10

## 2021-12-02 MED ORDER — TICAGRELOR 90 MG PO TABS
ORAL_TABLET | ORAL | Status: AC
  Filled 2021-12-02: qty 2

## 2021-12-02 MED ORDER — FENTANYL CITRATE (PF) 100 MCG/2ML IJ SOLN
INTRAMUSCULAR | Status: AC
  Filled 2021-12-02: qty 2

## 2021-12-02 MED ORDER — MORPHINE SULFATE (PF) 2 MG/ML IV SOLN: 2.0000 mg | INTRAVENOUS | Status: DC | PRN

## 2021-12-02 MED ORDER — ASPIRIN 81 MG PO CHEW: 81.0000 mg | CHEWABLE_TABLET | Freq: Every day | ORAL | Status: DC

## 2021-12-02 MED ORDER — MIDAZOLAM HCL 2 MG/2ML IJ SOLN
INTRAMUSCULAR | Status: DC | PRN
  Administered 2021-12-02 (×2): 1 mg via INTRAVENOUS

## 2021-12-02 MED ORDER — HEPARIN (PORCINE) IN NACL 1000-0.9 UT/500ML-% IV SOLN
INTRAVENOUS | Status: DC | PRN
  Administered 2021-12-02 (×2): 500 mL

## 2021-12-02 MED ORDER — FENTANYL CITRATE (PF) 100 MCG/2ML IJ SOLN
INTRAMUSCULAR | Status: DC | PRN
  Administered 2021-12-02 (×2): 25 ug via INTRAVENOUS

## 2021-12-02 SURGICAL SUPPLY — 20 items
BALLN EUPHORA RX 2.0X12 (BALLOONS) ×2
BALLN ~~LOC~~ EUPHORA RX 2.75X12 (BALLOONS) ×2
BALLOON EUPHORA RX 2.0X12 (BALLOONS) IMPLANT
BALLOON ~~LOC~~ EUPHORA RX 2.75X12 (BALLOONS) IMPLANT
BAND CMPR LRG ZPHR (HEMOSTASIS) ×1
BAND ZEPHYR COMPRESS 30 LONG (HEMOSTASIS) ×1 IMPLANT
CATH LAUNCHER 6FR JR4 (CATHETERS) ×1 IMPLANT
CATH OPTITORQUE TIG 4.0 5F (CATHETERS) ×1 IMPLANT
GLIDESHEATH SLEND A-KIT 6F 22G (SHEATH) ×1 IMPLANT
GUIDEWIRE INQWIRE 1.5J.035X260 (WIRE) IMPLANT
INQWIRE 1.5J .035X260CM (WIRE) ×2
KIT ENCORE 26 ADVANTAGE (KITS) ×1 IMPLANT
KIT HEART LEFT (KITS) ×2 IMPLANT
PACK CARDIAC CATHETERIZATION (CUSTOM PROCEDURE TRAY) ×2 IMPLANT
SHEATH PROBE COVER 6X72 (BAG) ×1 IMPLANT
STENT ONYX FRONTIER 2.5X18 (Permanent Stent) ×1 IMPLANT
TRANSDUCER W/STOPCOCK (MISCELLANEOUS) ×2 IMPLANT
TUBING CIL FLEX 10 FLL-RA (TUBING) ×2 IMPLANT
WIRE ASAHI PROWATER 180CM (WIRE) ×1 IMPLANT
WIRE HI TORQ VERSACORE-J 145CM (WIRE) ×1 IMPLANT

## 2021-12-02 NOTE — Progress Notes (Signed)
Patient is not interested in taking statin medications, that drug class specifically. Patient is aware of LDL levels. Patient said she would be interested in alternative medications if available.

## 2021-12-02 NOTE — Progress Notes (Signed)
Inpatient Diabetes Program Recommendations  AACE/ADA: New Consensus Statement on Inpatient Glycemic Control (2015)  Target Ranges:  Prepandial:   less than 140 mg/dL      Peak postprandial:   less than 180 mg/dL (1-2 hours)      Critically ill patients:  140 - 180 mg/dL   Lab Results  Component Value Date   GLUCAP 279 (H) 12/02/2021   HGBA1C 10.0 (H) 11/30/2021    Review of Glycemic Control  Latest Reference Range & Units 12/02/21 11:54  Glucose-Capillary 70 - 99 mg/dL 945 (H)  (H): Data is abnormally high Diabetes history: Type 2 Dm Outpatient Diabetes medications: Metformin 500 mg BID, glipizide 10 mg QA Current orders for Inpatient glycemic control: Novolog 0-15 units TID, glipizide 10 mg QA  Inpatient Diabetes Program Recommendations:    Consider adding Levemir 10 units QHS.  Spoke with patient's daughter, encouraged to reach back at a later time.  Will continue to attempt when appropriate.   Thanks, Lujean Rave, MSN, RNC-OB Diabetes Coordinator [phone] (8a-5p)  Thanks, Lujean Rave, MSN, RNC-OB Diabetes Coordinator [phone] (8a-5p)

## 2021-12-02 NOTE — TOC Benefit Eligibility Note (Signed)
Patient Advocate Encounter ? ?Insurance verification completed.   ? ?The patient is currently admitted and upon discharge could be taking Brilinta 90 mg. ? ?The current 30 day co-pay is, $0.00.  ? ?The patient is insured through [employer] Medicare Part D  ? ? ? ?Zakyla Tonche, CPhT ?Pharmacy Patient Advocate Specialist ?[facility] Patient Advocate Team ?Direct Number: [phone]  Fax: [phone] ? ? ? ? ? ?  ?

## 2021-12-02 NOTE — Interval H&P Note (Signed)
Cath Lab Visit (complete for each Cath Lab visit)  Clinical Evaluation Leading to the Procedure:   ACS: Yes.    Non-ACS:    Anginal Classification: CCS II  Anti-ischemic medical therapy: No Therapy  Non-Invasive Test Results: No non-invasive testing performed  Prior CABG: No previous CABG      History and Physical Interval Note:  12/02/2021 7:43 AM  Karina Pierce  has presented today for surgery, with the diagnosis of NSTEMI.  The various methods of treatment have been discussed with the patient and family. After consideration of risks, benefits and other options for treatment, the patient has consented to  Procedure(s): LEFT HEART CATH AND CORONARY ANGIOGRAPHY (N/A) as a surgical intervention.  The patient's history has been reviewed, patient examined, no change in status, stable for surgery.  I have reviewed the patient's chart and labs.  Questions were answered to the patient's satisfaction.     Quay Burow

## 2021-12-03 ENCOUNTER — Other Ambulatory Visit (HOSPITAL_COMMUNITY): Payer: Self-pay

## 2021-12-03 DIAGNOSIS — E119 Type 2 diabetes mellitus without complications: Secondary | ICD-10-CM

## 2021-12-03 DIAGNOSIS — E118 Type 2 diabetes mellitus with unspecified complications: Secondary | ICD-10-CM

## 2021-12-03 LAB — BASIC METABOLIC PANEL
Anion gap: 5 (ref 5–15)
BUN: 5 mg/dL — ABNORMAL LOW (ref 8–23)
CO2: 28 mmol/L (ref 22–32)
Calcium: 9 mg/dL (ref 8.9–10.3)
Chloride: 106 mmol/L (ref 98–111)
Creatinine, Ser: 0.98 mg/dL (ref 0.44–1.00)
GFR, Estimated: >60 mL/min (ref >60)
Glucose, Bld: 217 mg/dL — ABNORMAL HIGH (ref 70–99)
Potassium: 3.8 mmol/L (ref 3.5–5.1)
Sodium: 139 mmol/L (ref 135–145)

## 2021-12-03 LAB — CBC
HCT: 33.3 % — ABNORMAL LOW (ref 36.0–46.0)
Hemoglobin: 10.8 g/dL — ABNORMAL LOW (ref 12.0–15.0)
MCH: 27.5 pg (ref 26.0–34.0)
MCHC: 32.4 g/dL (ref 30.0–36.0)
MCV: 84.7 fL (ref 80.0–100.0)
Platelets: 354 10*3/uL (ref 150–400)
RBC: 3.93 MIL/uL (ref 3.87–5.11)
RDW: 13.4 % (ref 11.5–15.5)
WBC: 9.1 10*3/uL (ref 4.0–10.5)
nRBC: 0 % (ref 0.0–0.2)

## 2021-12-03 LAB — LIPOPROTEIN A (LPA): Lipoprotein (a): 534 nmol/L — ABNORMAL HIGH (ref <75.0)

## 2021-12-03 LAB — GLUCOSE, CAPILLARY
Glucose-Capillary: 225 mg/dL — ABNORMAL HIGH (ref 70–99)
Glucose-Capillary: 237 mg/dL — ABNORMAL HIGH (ref 70–99)

## 2021-12-03 MED ORDER — CLOPIDOGREL BISULFATE 75 MG PO TABS: 75.0000 mg | ORAL_TABLET | Freq: Every day | ORAL | Status: DC

## 2021-12-03 MED ORDER — ROSUVASTATIN CALCIUM 40 MG PO TABS
40.0000 mg | ORAL_TABLET | Freq: Every day | ORAL | 0 refills | Status: DC
  Filled 2021-12-03: qty 90, 90d supply, fill #0

## 2021-12-03 MED ORDER — ASPIRIN 81 MG PO TBEC
81.0000 mg | DELAYED_RELEASE_TABLET | Freq: Every day | ORAL | 12 refills | Status: DC
  Filled 2021-12-03: qty 30, 30d supply, fill #0

## 2021-12-03 MED ORDER — NITROGLYCERIN 0.4 MG SL SUBL
0.4000 mg | SUBLINGUAL_TABLET | SUBLINGUAL | 2 refills | Status: DC | PRN
Start: 2021-12-03 — End: 2022-10-09
  Filled 2021-12-03: qty 25, 14d supply, fill #0

## 2021-12-03 MED ORDER — CLOPIDOGREL BISULFATE 300 MG PO TABS
600.0000 mg | ORAL_TABLET | Freq: Once | ORAL | Status: AC
  Administered 2021-12-03: 600 mg via ORAL
  Filled 2021-12-03 (×2): qty 2

## 2021-12-03 MED ORDER — CLOPIDOGREL BISULFATE 75 MG PO TABS
75.0000 mg | ORAL_TABLET | Freq: Every day | ORAL | 2 refills | Status: DC
  Filled 2021-12-03: qty 90, 90d supply, fill #0

## 2021-12-03 MED ORDER — METOPROLOL TARTRATE 25 MG PO TABS
12.5000 mg | ORAL_TABLET | Freq: Two times a day (BID) | ORAL | 0 refills | Status: DC
  Filled 2021-12-03: qty 90, 90d supply, fill #0

## 2021-12-03 NOTE — Progress Notes (Signed)
CARDIAC REHAB PHASE I   PRE:  Rate/Rhythm: 77 SR    BP: sitting 118/56    SaO2:   MODE:  Ambulation: 200 ft  POST:  Rate/Rhythm: 88 SR    BP: sitting 125/64     SaO2: 90 RA  2nd walk HR 90 SR, SaO2 maintained 95-96 RA, distance 170 ft, quicker pace.   Pt c/o soreness. Very slow walk, keeping close to railing. Seemed fatigued/exerted by her slow pace but unsurprised so expect this is close to her baseline. SaO2 90 RA after walk, concerned with CT results, therefore ambulated again after walk and observed SAO2, which maintained 95-96 RA.   Discussed MI, stent, Brilinta importance, diet, exercise, NTG, and CRPII. Pt receptive. Will refer to [place] CRPII.  [id number]  Ethelda Chick CES, ACSM 12/03/2021 9:21 AM

## 2021-12-03 NOTE — Discharge Summary (Addendum)
The patient has been seen in conjunction with Harlan Stains, NP. All aspects of care have been considered and discussed. The patient has been personally interviewed, examined, and all clinical data has been reviewed.  Good PCI result on RCA. Aggressive risk factor modification. Access site is unremarkable. Switch Brilinta to clopidogrel due to intolerance (dyspnea). Phase 2 cardiac rehab Discharge today.   Discharge Summary    Patient ID: Karina Pierce MRN: [id number]; DOB: Nov 22, 1955  Admit date: 11/30/2021 Discharge date: 12/03/2021  PCP:  Patient, No Pcp Per (Inactive)   [facility] Providers Cardiologist:  Minus Breeding, MD     Discharge Diagnoses    Principal Problem:   Chest pain Active Problems:   NSTEMI (non-ST elevated myocardial infarction) ([place])   Hyperlipidemia LDL goal <70   Primary hypertension   Type 2 diabetes mellitus with complication, without long-term current use of insulin [facility])  Diagnostic Studies/Procedures    Cath: 12/02/21    Mid LAD lesion is 30% stenosed.   Prox RCA to Mid RCA lesion is 95% stenosed.   A drug-eluting stent was successfully placed using a STENT ONYX FRONTIER 2.5X18.   Post intervention, there is a 0% residual stenosis.  IMPRESSION: Successful PCI drug-eluting stent of mid RCA the setting of non-STEMI with an excellent angiographic result.  Patient will need DAPT uninterrupted for 12 months in addition to high-dose statin therapy and focus on more aggressive treatment of her diabetes.  She left the lab in stable condition.   Quay Burow. MD, [facility] 12/02/2021 9:00 AM  Diagnostic Dominance: Right Intervention   Echo: 11/30/21  IMPRESSIONS     1. Left ventricular ejection fraction, by estimation, is 60 to 65%. The  left ventricle has normal function. The left ventricle has no regional  wall motion abnormalities. Left ventricular diastolic parameters are  indeterminate.   2. Right ventricular systolic function is  normal. The right ventricular  size is normal. There is mildly elevated pulmonary artery systolic  pressure. The estimated right ventricular systolic pressure is 77.9 mmHg.   3. The mitral valve is normal in structure. Mild mitral valve  regurgitation.   4. The aortic valve is tricuspid. Aortic valve regurgitation is trivial.  Aortic valve sclerosis is present, with no evidence of aortic valve  stenosis.   5. The inferior vena cava is dilated in size with >50% respiratory  variability, suggesting right atrial pressure of 8 mmHg.   FINDINGS   Left Ventricle: Left ventricular ejection fraction, by estimation, is 60  to 65%. The left ventricle has normal function. The left ventricle has no  regional wall motion abnormalities. The left ventricular internal cavity  size was normal in size. There is   no left ventricular hypertrophy. Left ventricular diastolic parameters  are indeterminate.   Right Ventricle: The right ventricular size is normal. No increase in  right ventricular wall thickness. Right ventricular systolic function is  normal. There is mildly elevated pulmonary artery systolic pressure. The  tricuspid regurgitant velocity is 2.82   m/s, and with an assumed right atrial pressure of 8 mmHg, the estimated  right ventricular systolic pressure is 39.0 mmHg.   Left Atrium: Left atrial size was normal in size.   Right Atrium: Right atrial size was normal in size.   Pericardium: There is no evidence of pericardial effusion.   Mitral Valve: The mitral valve is normal in structure. Mild mitral valve  regurgitation.   Tricuspid Valve: The tricuspid valve is normal in structure. Tricuspid  valve regurgitation  is trivial.   Aortic Valve: The aortic valve is tricuspid. Aortic valve regurgitation is  trivial. Aortic valve sclerosis is present, with no evidence of aortic  valve stenosis.   Pulmonic Valve: The pulmonic valve was not well visualized. Pulmonic valve  regurgitation  is trivial.   Aorta: The aortic root and ascending aorta are structurally normal, with  no evidence of dilitation.   Venous: The inferior vena cava is dilated in size with greater than 50%  respiratory variability, suggesting right atrial pressure of 8 mmHg.   IAS/Shunts: The interatrial septum was not well visualized.  _____________   History of Present Illness     Karina Pierce is a 66 y.o. female with past medical history of Type 2 DM, former tobacco use and family history of CAD who was seen 11/30/2021 for the evaluation of chest pain and elevated troponin values at the request of Dr. Ronnald Nian.  [name] presented to [facility] ED on 11/30/2021 for evaluation of chest pain which started earlier this morning and awoke her from sleep. She reported she had noticed an occasional episode of a dull aching pain along her left pectoral region for the past week but thought this was possibly secondary to the skin abscess on her chest (reports this has been present for 8+ months and appears most consistent with a sebaceous cyst). The morning of admission, she was awoken from sleep with a more severe chest pain and reports she was diaphoretic at that time. She called out to her daughter for help and by the time her daughter arrived to the room, she had lost consciousness on the bed. Her daughter started pressing on her chest and she came to.  Estimated LOC was less than 30 seconds.  She reported having baseline dyspnea on exertion but no specific orthopnea, PND or pitting edema.  Unaware of any personal history of CAD, CHF or cardiac arrhythmias. Reports her mother had CABG in her 42's. The patient is a former smoker but quit 30+ years ago.    Initial labs show WBC 9.9, Hgb 10.9 (no recent values for comparison but previously WNL in 10/2016) and platelets 371. Na+ 139, K+ 3.8 and creatinine 0.91. AST 16 and ALT 13. D-dimer 1.83. Initial Hs Troponin 12 with repeat of 73. BNP 51. EKG shows NSR, HR 72 with TWI along  Leads I and AVL which is new when compared to prior tracings from 10/2016. CXR showing mild bronchitic changes and aortic atherosclerosis. CT Head showed no acute intracranial abnormality. Was noted to have high-attenuation material within the left globe likely representing intra-ocular injection silicone oil treatment for retinal detachment. CTA showed no evidence of a PE but was noted to have areas of ground-glass attenuation and mild septal thickening which might suggest pulmonary edema or could be indicative of ILD. Also noted to have aortic atherosclerosis, 2-vessel CAD and mild calcifications of the aortic valve and mitral annulus.  Cardiology asked to admit for further evaluation.  Hospital Course     NSTEMI: Underwent cardiac catheterization noted above with 95% stenosis in the proximal to mid RCA treated with PCI/DES x1.  Initially loaded with Brilinta but developed significant dyspnea.  Transition to Plavix with 600 mg load.  Plan for DAPT with aspirin/Plavix for at least 1 year. Follow up echo with LVEF of 60-65% with no rWMA noted, mildly elevated pulmonary artery pressure, mild MR. Seen by cardiac rehab.  Hyperlipidemia: LDL 143, LP(a) 534 --Start Crestor 40 mg daily --LFTs/FLP in 8-week  Diabetes:  Hemoglobin A1c 10.  -- Resume metformin and glipizide at discharge -- Seen by diabetes educator -- follow up with PCP regarding ongoing management  General: Well developed, well nourished, female appearing in no acute distress. Head: Normocephalic, atraumatic.  Neck: Supple without bruits, JVD. Lungs:  Resp regular and unlabored, CTA. Heart: RRR, S1, S2, no S3, S4, or murmur; no rub. Abdomen: Soft, non-tender, non-distended with normoactive bowel sounds. No hepatomegaly. No rebound/guarding. No obvious abdominal masses. Extremities: No clubbing, cyanosis, edema. Distal pedal pulses are 2+ bilaterally. Right radial cath site stable without bruising or hematoma Neuro: Alert and oriented X  3. Moves all extremities spontaneously. Psych: Normal affect.  Patient was seen by Dr. Tamala Julian and deemed stable for discharge home. Follow up in the office arranged. Medications sent to the [facility] pharmacy. Educated by pharmD prior to discharge.   Did the patient have an acute coronary syndrome (MI, NSTEMI, STEMI, etc) this admission?:  Yes                               AHA/ACC Clinical Performance & Quality Measures: Aspirin prescribed? - Yes ADP Receptor Inhibitor (Plavix/Clopidogrel, Brilinta/Ticagrelor or Effient/Prasugrel) prescribed (includes medically managed patients)? - Yes Beta Blocker prescribed? - Yes High Intensity Statin (Lipitor 40-63m or Crestor 20-473m prescribed? - Yes EF assessed during THIS hospitalization? - Yes For EF <40%, was ACEI/ARB prescribed? - Not Applicable (EF >/= 4021%For EF <40%, Aldosterone Antagonist (Spironolactone or Eplerenone) prescribed? - Not Applicable (EF >/= 4030%Cardiac Rehab Phase II ordered (including medically managed patients)? - Yes     The patient will be scheduled for a TOC follow up appointment in 10-14 days.  A [facility] at the office where the patient should be seen for follow up.  _____________  Discharge Vitals Blood pressure (!) 118/57, pulse 78, temperature 98.1 F (36.7 C), temperature source Oral, resp. rate 15, height 5' 10" (1.778 m), weight 110.1 kg, SpO2 94 %.  Filed Weights   12/01/21 0300 12/02/21 0427 12/03/21 0449  Weight: 112.7 kg 109.5 kg 110.1 kg    Labs & Radiologic Studies    CBC Recent Labs    12/01/21 0302 12/03/21 0403  WBC 8.7 9.1  HGB 10.8* 10.8*  HCT 33.3* 33.3*  MCV 84.1 84.7  PLT 361 [id number] Basic Metabolic Panel Recent Labs    12/03/21 0403  NA 139  K 3.8  CL 106  CO2 28  GLUCOSE 217*  BUN 5*  CREATININE 0.98  CALCIUM 9.0   Liver Function Tests No results for input(s): AST, ALT, ALKPHOS, BILITOT, PROT, ALBUMIN in the last 72 hours. No  results for input(s): LIPASE, AMYLASE in the last 72 hours. High Sensitivity Troponin:   Recent Labs  Lab 11/30/21 0551 11/30/21 0800 11/30/21 1214 12/01/21 0302 12/01/21 0711  TROPONINIHS 12 73* 1,244* 4,071* 4,489*    BNP Invalid input(s): POCBNP D-Dimer No results for input(s): DDIMER in the last 72 hours. Hemoglobin A1C Recent Labs    11/30/21 1230  HGBA1C 10.0*   Fasting Lipid Panel Recent Labs    12/01/21 0302  CHOL 221*  HDL 47  LDLCALC 143*  TRIG 153*  CHOLHDL 4.7   Thyroid Function Tests No results for input(s): TSH, T4TOTAL, T3FREE, THYROIDAB in the last 72 hours.  Invalid input(s): FREET3 _____________  DG Chest 2 View  Result Date: 11/30/2021 CLINICAL DATA:  Chest pain  EXAM: CHEST - 2 VIEW COMPARISON:  None Available. FINDINGS: Cardiac and mediastinal contours are within normal limits. Atherosclerotic calcifications in the transverse aorta. Mild background bronchitic changes and interstitial prominence. No pneumothorax or pleural effusion. No acute osseous abnormality. IMPRESSION: 1. Diffuse mild bronchitic changes and interstitial prominence may reflect acute or chronic bronchitis. 2. Aortic atherosclerotic vascular calcifications. Electronically Signed   By: Jacqulynn Cadet M.D.   On: 11/30/2021 07:12   CT Head Wo Contrast  Result Date: 11/30/2021 CLINICAL DATA:  Mental status changes, syncope EXAM: CT HEAD WITHOUT CONTRAST TECHNIQUE: Contiguous axial images were obtained from the base of the skull through the vertex without intravenous contrast. RADIATION DOSE REDUCTION: This exam was performed according to the departmental dose-optimization program which includes automated exposure control, adjustment of the mA and/or kV according to patient size and/or use of iterative reconstruction technique. COMPARISON:  Prior CT scan of the head 10/27/2019 FINDINGS: Brain: No evidence of acute infarction, hemorrhage, hydrocephalus, extra-axial collection or mass  lesion/mass effect. Vascular: No hyperdense vessel or unexpected calcification. Skull: Normal. Negative for fracture or focal lesion. Sinuses/Orbits: No acute finding. 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 Other: None. IMPRESSION: 1. No acute intracranial abnormality. 2. Compared to prior imaging, new high attenuation material within the left globe. This presumably represents intra-ocular injection of silicone oil for treatment of retinal detachment. Electronically Signed   By: Jacqulynn Cadet M.D.   On: 11/30/2021 08:08   CT Angio Chest PE W and/or Wo Contrast  Result Date: 11/30/2021 CLINICAL DATA:  66 year old female with history of positive D-dimer. Evaluate for pulmonary embolism. EXAM: CT ANGIOGRAPHY CHEST WITH CONTRAST TECHNIQUE: Multidetector CT imaging of the chest was performed using the standard protocol during bolus administration of intravenous contrast. Multiplanar CT image reconstructions and MIPs were obtained to evaluate the vascular anatomy. RADIATION DOSE REDUCTION: This exam was performed according to the departmental dose-optimization program which includes automated exposure control, adjustment of the mA and/or kV according to patient size and/or use of iterative reconstruction technique. CONTRAST:  182m OMNIPAQUE IOHEXOL 350 MG/ML SOLN COMPARISON:  No priors. FINDINGS: Cardiovascular: No filling defects within the pulmonary arterial tree to suggest pulmonary embolism. Heart size is borderline enlarged. There is no significant pericardial fluid, thickening or pericardial calcification. There is aortic atherosclerosis, as well as atherosclerosis of the great vessels of the mediastinum and the coronary arteries, including calcified atherosclerotic plaque in the left anterior descending and left circumflex coronary arteries. Mild calcifications of the aortic valve and  mitral annulus. Mediastinum/Nodes: No pathologically enlarged mediastinal or hilar lymph nodes. Esophagus is unremarkable in appearance. No axillary lymphadenopathy. Lungs/Pleura: Patchy areas of ground-glass attenuation and mild septal thickening are noted throughout the lungs bilaterally. No confluent consolidative airspace disease. No pleural effusions. No definite suspicious appearing pulmonary nodules or masses are noted. Upper Abdomen: Unremarkable. Musculoskeletal: There are no aggressive appearing lytic or blastic lesions noted in the visualized portions of the skeleton. Review of the MIP images confirms the above findings. IMPRESSION: 1. No evidence of pulmonary embolism. 2. Areas of ground-glass attenuation and mild septal thickening noted in the lungs bilaterally. This could suggest mild interstitial pulmonary edema (particularly in light of the borderline cardiomegaly) in the setting of mild congestive heart failure, or could be indicative of interstitial lung disease. Further clinical evaluation is recommended. 3. Aortic atherosclerosis, in addition to two-vessel coronary artery disease. Please note that although the presence  of coronary artery calcium documents the presence of coronary artery disease, the severity of this disease and any potential stenosis cannot be assessed on this non-gated CT examination. Assessment for potential risk factor modification, dietary therapy or pharmacologic therapy may be warranted, if clinically indicated. 4. There are mild calcifications of the aortic valve and mitral annulus. Echocardiographic correlation for evaluation of potential valvular dysfunction may be warranted if clinically indicated. Aortic Atherosclerosis (ICD10-I70.0). Electronically Signed   By: Vinnie Langton M.D.   On: 11/30/2021 08:16   CARDIAC CATHETERIZATION  Result Date: 12/02/2021   Mid LAD lesion is 30% stenosed.   Prox RCA to Mid RCA lesion is 95% stenosed.   A drug-eluting stent was  successfully placed using a STENT ONYX FRONTIER 2.5X18.   Post intervention, there is a 0% residual stenosis. Karina Pierce is a 66 y.o. female  [id number] LOCATION:  FACILITY: [place] PHYSICIAN: Quay Burow, M.D. Apr 14, 1956 DATE OF PROCEDURE:  12/02/2021 DATE OF DISCHARGE: CARDIAC CATHETERIZATION / PCI DES RCA History obtained from chart review.66 y.o. female with past medical history of Type 2 DM, former tobacco use and family history of CAD who is being seen 11/30/2021 for the evaluation of chest pain and elevated troponin values at the request of Dr. Ronnald Nian.  Troponin peaked at approximately 4000.  LV function was normal by 2D echocardiogram.  She is remained on IV heparin and nitroglycerin and has been pain-free.  She presents now for diagnostic coronary angiography. PROCEDURE DESCRIPTION: The patient was brought to the second floor [facility] cath lab in the postabsorptive state.  She was premedicated with IV Versed and fentanyl.  Her right wrist was prepped and shaved in usual sterile fashion. Xylocaine 1% was used for local anesthesia. A 6 French sheath was inserted into the right radial artery using standard Seldinger technique. The patient received 11,000 units  of heparin intravenously.  A 5 [place] TIG catheter was used for selective coronary angiography and obtain left heart pressures.  Isovue dye was used for the entirety of the case (150 cc of contrast total delivered to patient).  Retrograde aortic, ventricular and pullback pressures were recorded.  LVEDP was 18.  Radial cocktail was administered via the SideArm sheath. The patient received 180 mg of p.o. Brilinta loading dose.  The ACT was 323.  Isovue dye was used for the entirety of the intervention.  Using a JR4 guiding catheter, 0.14 [place] guidewire and a 2 mm x 12 mm balloon the mid RCA ruptured plaque (culprit lesion) was crossed with little difficulty and predilated.  A 2.5 x 18 mm long Medtronic Onyx frontier drug-eluting stent was then  carefully positioned and deployed at 14 atm.  This was postdilated with a 2.75 x 12 mm long Euphora noncompliant balloon at 14 atm (2.75 mm) resulting in reduction of a 95% eccentric ulcerated/ruptured plaque to 0% residual with an excellent angiographic result.  There were no hemodynamic or electrocardiographic sequelae.  The guidewire and catheter were removed.  The sheath was removed and a TR band was placed on the right wrist to achieve patent hemostasis.   Successful PCI drug-eluting stent of mid RCA the setting of non-STEMI with an excellent angiographic result.  Patient will need DAPT uninterrupted for 12 months in addition to high-dose statin therapy and focus on more aggressive treatment of her diabetes.  She left the lab in stable condition. Quay Burow. MD, [facility] 12/02/2021 9:00 AM    ECHOCARDIOGRAM COMPLETE  Result Date: 11/30/2021    ECHOCARDIOGRAM REPORT  Patient Name:   CYLAH FANNIN Date of Exam: 11/30/2021 Medical Rec #:  [id number]  Height:       70.0 in Accession #:    [id number] Weight:       250.0 lb Date of Birth:  1955-09-02  BSA:          2.295 m Patient Age:    46 years   BP:           124/66 mmHg Patient Gender: F          HR:           70 bpm. Exam Location:  Inpatient Procedure: 2D Echo Indications:     chest pain  History:         Patient has no prior history of Echocardiogram examinations.  Sonographer:     Johny Chess RDCS Referring Phys:  [id number] Erma Heritage Diagnosing Phys: Oswaldo Milian MD IMPRESSIONS  1. Left ventricular ejection fraction, by estimation, is 60 to 65%. The left ventricle has normal function. The left ventricle has no regional wall motion abnormalities. Left ventricular diastolic parameters are indeterminate.  2. Right ventricular systolic function is normal. The right ventricular size is normal. There is mildly elevated pulmonary artery systolic pressure. The estimated right ventricular systolic pressure is 32.3 mmHg.  3. The mitral valve is  normal in structure. Mild mitral valve regurgitation.  4. The aortic valve is tricuspid. Aortic valve regurgitation is trivial. Aortic valve sclerosis is present, with no evidence of aortic valve stenosis.  5. The inferior vena cava is dilated in size with >50% respiratory variability, suggesting right atrial pressure of 8 mmHg. FINDINGS  Left Ventricle: Left ventricular ejection fraction, by estimation, is 60 to 65%. The left ventricle has normal function. The left ventricle has no regional wall motion abnormalities. The left ventricular internal cavity size was normal in size. There is  no left ventricular hypertrophy. Left ventricular diastolic parameters are indeterminate. Right Ventricle: The right ventricular size is normal. No increase in right ventricular wall thickness. Right ventricular systolic function is normal. There is mildly elevated pulmonary artery systolic pressure. The tricuspid regurgitant velocity is 2.82  m/s, and with an assumed right atrial pressure of 8 mmHg, the estimated right ventricular systolic pressure is 55.7 mmHg. Left Atrium: Left atrial size was normal in size. Right Atrium: Right atrial size was normal in size. Pericardium: There is no evidence of pericardial effusion. Mitral Valve: The mitral valve is normal in structure. Mild mitral valve regurgitation. Tricuspid Valve: The tricuspid valve is normal in structure. Tricuspid valve regurgitation is trivial. Aortic Valve: The aortic valve is tricuspid. Aortic valve regurgitation is trivial. Aortic valve sclerosis is present, with no evidence of aortic valve stenosis. Pulmonic Valve: The pulmonic valve was not well visualized. Pulmonic valve regurgitation is trivial. Aorta: The aortic root and ascending aorta are structurally normal, with no evidence of dilitation. Venous: The inferior vena cava is dilated in size with greater than 50% respiratory variability, suggesting right atrial pressure of 8 mmHg. IAS/Shunts: The interatrial  septum was not well visualized.  LEFT VENTRICLE PLAX 2D LVIDd:         5.40 cm Diastology LVIDs:         3.30 cm LV e' medial:  9.00 cm/s LV PW:         0.80 cm LV e' lateral: 10.30 cm/s LV IVS:        0.80 cm  RIGHT VENTRICLE  IVC RV S prime:     14.20 cm/s  IVC diam: 2.30 cm TAPSE (M-mode): 2.1 cm LEFT ATRIUM             Index        RIGHT ATRIUM           Index LA diam:        3.60 cm 1.57 cm/m   RA Area:     14.90 cm LA Vol (A2C):   58.9 ml 25.66 ml/m  RA Volume:   37.40 ml  16.30 ml/m LA Vol (A4C):   59.5 ml 25.93 ml/m LA Biplane Vol: 61.0 ml 26.58 ml/m  AORTIC VALVE LVOT Vmax:   89.70 cm/s LVOT Vmean:  58.600 cm/s LVOT VTI:    0.212 m  AORTA Ao Root diam: 2.70 cm Ao Asc diam:  3.10 cm MR Peak grad:    88.0 mmHg    TRICUSPID VALVE MR Mean grad:    64.0 mmHg    TR Peak grad:   31.8 mmHg MR Vmax:         469.00 cm/s  TR Vmax:        282.00 cm/s MR Vmean:        382.0 cm/s MR PISA:         0.57 cm     SHUNTS MR PISA Eff ROA: 5 mm        Systemic VTI: 0.21 m MR PISA Radius:  0.30 cm Oswaldo Milian MD Electronically signed by Oswaldo Milian MD Signature Date/Time: 11/30/2021/6:07:05 PM    Final (Updated)     Disposition   Pt is being discharged home today in good condition.  Follow-up Plans & Appointments     Follow-up Information     Darreld Mclean, PA-C Follow up on 12/10/2021.   Specialties: Physician Assistant, Cardiology Why: at 2:45pm for your follow up appt. Contact information: [address] [place] [address] [phone]                Discharge Instructions     Amb Referral to Cardiac Rehabilitation   Complete by: As directed    Diagnosis:  Coronary Stents NSTEMI PTCA     After initial evaluation and assessments completed: Virtual Based Care may be provided alone or in conjunction with Phase 2 Cardiac Rehab based on patient barriers.: Yes   Call MD for:  difficulty breathing, headache or visual disturbances   Complete by:  As directed    Call MD for:  persistant dizziness or light-headedness   Complete by: As directed    Call MD for:  redness, tenderness, or signs of infection (pain, swelling, redness, odor or green/yellow discharge around incision site)   Complete by: As directed    Diet - low sodium heart healthy   Complete by: As directed    Discharge instructions   Complete by: As directed    Radial Site Care Refer to this sheet in the next few weeks. These instructions provide you with information on caring for yourself after your procedure. Your caregiver may also give you more specific instructions. Your treatment has been planned according to current medical practices, but problems sometimes occur. Call your caregiver if you have any problems or questions after your procedure. HOME CARE INSTRUCTIONS You may shower the day after the procedure. Remove the bandage (dressing) and gently wash the site with plain soap and water. Gently pat the site dry.  Do not apply powder or lotion to the site.  Do not submerge the affected site in water for 3 to 5 days.  Inspect the site at least twice daily.  Do not flex or bend the affected arm for 24 hours.  No lifting over 5 pounds (2.3 kg) for 5 days after your procedure.  Do not drive home if you are discharged the same day of the procedure. Have someone else drive you.  You may drive 24 hours after the procedure unless otherwise instructed by your caregiver.  What to expect: Any bruising will usually fade within 1 to 2 weeks.  Blood that collects in the tissue (hematoma) may be painful to the touch. It should usually decrease in size and tenderness within 1 to 2 weeks.  SEEK IMMEDIATE MEDICAL CARE IF: You have unusual pain at the radial site.  You have redness, warmth, swelling, or pain at the radial site.  You have drainage (other than a small amount of blood on the dressing).  You have chills.  You have a fever or persistent symptoms for more than 72 hours.   You have a fever and your symptoms suddenly get worse.  Your arm becomes pale, cool, tingly, or numb.  You have heavy bleeding from the site. Hold pressure on the site.   PLEASE DO NOT MISS ANY DOSES OF YOUR PLAVIX!!!!! Also keep a log of you blood pressures and bring back to your follow up appt. Please call the office with any questions.   Patients taking blood thinners should generally stay away from medicines like ibuprofen, Advil, Motrin, naproxen, and Aleve due to risk of stomach bleeding. You may take Tylenol as directed or talk to your primary doctor about alternatives.   PLEASE ENSURE THAT YOU DO NOT RUN OUT OF YOUR PLAVIX. This medication is very important to remain on for at least one year. IF you have issues obtaining this medication due to cost please CALL the office 3-5 business days prior to running out in order to prevent missing doses of this medication.   Increase activity slowly   Complete by: As directed        Discharge Medications   Allergies as of 12/03/2021   No Known Allergies      Medication List     STOP taking these medications    ibuprofen 600 MG tablet Commonly known as: ADVIL       TAKE these medications    acetaminophen 500 MG tablet Commonly known as: TYLENOL Take 1 tablet (500 mg total) by mouth every 6 (six) hours as needed. What changed: reasons to take this   aspirin EC 81 MG tablet Take 1 tablet (81 mg total) by mouth daily. Swallow whole.   clopidogrel 75 MG tablet Commonly known as: PLAVIX Take 1 tablet (75 mg total) by mouth daily. Start taking on: December 04, 2021   glipiZIDE 10 MG tablet Commonly known as: GLUCOTROL Take 10 mg by mouth daily before breakfast.   metFORMIN 500 MG 24 hr tablet Commonly known as: GLUCOPHAGE-XR Take 500 mg by mouth in the morning and at bedtime.   metoprolol tartrate 25 MG tablet Commonly known as: LOPRESSOR Take 0.5 tablets (12.5 mg total) by mouth 2 (two) times daily.   nitroGLYCERIN 0.4  MG SL tablet Commonly known as: NITROSTAT Place 1 tablet (0.4 mg total) under the tongue every 5 (five) minutes x 3 doses as needed for chest pain.   oxyCODONE-acetaminophen 5-325 MG tablet Commonly known as: PERCOCET/ROXICET Take 1 tablet by mouth every 6 (six) hours as  needed. What changed: reasons to take this   rosuvastatin 40 MG tablet Commonly known as: CRESTOR Take 1 tablet (40 mg total) by mouth daily at 6 PM.   traMADol 50 MG tablet Commonly known as: ULTRAM Take 50 mg by mouth every 6 (six) hours as needed for moderate pain.         Outstanding Labs/Studies   FLP/LFTs in 8 weeks   Duration of Discharge Encounter   Greater than 30 minutes including physician time.  Signed, Reino Bellis, NP 12/03/2021, 10:19 AM

## 2021-12-03 NOTE — Progress Notes (Signed)
Cardiology Office Note:    Date:  12/10/2021   ID:  Karina Pierce, DOB 09-May-1956, MRN [id number]  PCP:  Patient, No Pcp Per (Inactive)  Cardiologist:  Minus Breeding, MD  Electrophysiologist:  None   Referring MD: No ref. provider found   Chief Complaint: hospital follow-up of NSTEMI  History of Present Illness:    Karina Pierce is a 66 y.o. female with a history of CAD with recent NSTEMI on 11/30/2021 s/p DES to RCA, hypertension, hyperlipidemia, type 2 diabetes mellitus, anemia, and former tobacco abuse who is followed by Dr. Percival Spanish and presents today for hospital follow-up after NSTEMI.  Patient was first seen by Cardiology during recent hospitalization. She was admitted from 11/30/2021 to 12/03/2021 for NSTEMI after presenting with chest pain and syncope. She reported dull chest aching along left pectoral region over the prior week which she attributed to a skin abscess on her chest which she has had for 8+ months. She then woke up from sleep with severe chest pain and had a brief syncopal episodes. Daughter states she initially thought she was not breathing but then quickly came to after starting chest compressions. It is estimated that LOC was less than 30 seconds. High-sensitivity troponin peaked at 4,489. D-dimer elevated. Chest CTA was negative for PE but did show areas of ground glass attenuation and mild septal thickening bilaterally which could suggest mild interstitial edema or possible interstitial lung disease. Echo showed LVEF of 60-65% with normal wall motion, mild MR, and mildly elevated PASP of 39.8 mmHg. LHC on 6/5 showed 95% stenosis of proximal to mid RCA and 30% stenosis of mid LAD. She underwent successful PCI with DES to RCA. She initially started on DAPT with Aspirin and Brilinta but developed significant dyspnea so was transitioned to Plavix prior to discharge. She was also started on a beta-blocker and high-intensity statin.  Patient presented back to the ED ED on 12/06/2021 for  right-sided chest pain as well as shortness of breath since recent discharge.  Description of pain was very consistent with musculoskeletal/chest wall pain. Work-up was unremarkable. EKG showed no acute ischemic changes. High-sensitivity troponin mildly elevated and down-trending at 88 >> 82.  X-ray showed no rib fractures or acute findings to explain pain.  Patient presents today for follow-up. Here with her daughter. She continues to have clear musculoskeletal chest wall pain under her breast that is worse with palpation and certain movements. However, no recurrent chest pain like what she had at the time of her MI. She also reports dyspnea on exertion which is new since her recent admission. However, she denies any orthopnea, PND, edema, or palpitations. Discussed syncopal event prior to recent admission. She states she woke up and started having left sided chest pain that quickly got progressively worse and got very severe. She then stood up and started walking when she started to feel hot and dizziness and then passed out. Her daughter states she stopped breathing and started chest compressions but states patient regained consciousness in less than 30 seconds.  She has never had a syncopal episode prior to this event and has had no recurrent syncope since then.  Past Medical History:  Diagnosis Date   Diabetes mellitus without complication [facility])     Past Surgical History:  Procedure Laterality Date   CORONARY STENT INTERVENTION N/A 12/02/2021   Procedure: CORONARY STENT INTERVENTION;  Surgeon: Lorretta Harp, MD;  Location: [place] [facility];  Service: Cardiovascular;  Laterality: N/A;   KNEE SURGERY  LEFT HEART CATH AND CORONARY ANGIOGRAPHY N/A 12/02/2021   Procedure: LEFT HEART CATH AND CORONARY ANGIOGRAPHY;  Surgeon: Lorretta Harp, MD;  Location: [place] [facility];  Service: Cardiovascular;  Laterality: N/A;    Current Medications: Current Meds  Medication Sig   acetaminophen  (TYLENOL) 500 MG tablet Take 1 tablet (500 mg total) by mouth every 6 (six) hours as needed. (Patient taking differently: Take 500 mg by mouth every 6 (six) hours as needed for moderate pain.)   aspirin EC 81 MG tablet Take 1 tablet (81 mg total) by mouth daily. Swallow whole.   clopidogrel (PLAVIX) 75 MG tablet Take 1 tablet (75 mg total) by mouth daily.   glipiZIDE (GLUCOTROL XL) 10 MG 24 hr tablet Take 10 mg by mouth daily with breakfast.   metoprolol tartrate (LOPRESSOR) 25 MG tablet Take 0.5 tablets (12.5 mg total) by mouth 2 (two) times daily.   nitroGLYCERIN (NITROSTAT) 0.4 MG SL tablet Place 1 tablet (0.4 mg total) under the tongue every 5 (five) minutes x 3 doses as needed for chest pain.   oxyCODONE-acetaminophen (PERCOCET) 5-325 MG tablet Take 1 tablet by mouth every 4 (four) hours as needed.   rosuvastatin (CRESTOR) 40 MG tablet Take 1 tablet (40 mg total) by mouth daily at 6 PM. (Patient taking differently: Take 20 mg by mouth daily at 6 PM. Take half 0.5 Tablet Daily in The Evening)     Allergies:   Patient has no known allergies.   Social History   Socioeconomic History   Marital status: Single    Spouse name: Not on file   Number of children: Not on file   Years of education: Not on file   Highest education level: Not on file  Occupational History   Not on file  Tobacco Use   Smoking status: Former   Smokeless tobacco: Never  Vaping Use   Vaping Use: Never used  Substance and Sexual Activity   Alcohol use: No   Drug use: No   Sexual activity: Not on file  Other Topics Concern   Not on file  Social History Narrative   Not on file   Social Determinants of Health   Financial Resource Strain: Not on file  Food Insecurity: Not on file  Transportation Needs: Not on file  Physical Activity: Not on file  Stress: Not on file  Social Connections: Not on file     Family History: The patient's family history includes CAD in her mother.  ROS:   Please see the  history of present illness.     EKGs/Labs/Other Studies Reviewed:    The following studies were reviewed today:  Echocardiogram 11/30/2021: Impressions:  1. Left ventricular ejection fraction, by estimation, is 60 to 65%. The  left ventricle has normal function. The left ventricle has no regional  wall motion abnormalities. Left ventricular diastolic parameters are  indeterminate.   2. Right ventricular systolic function is normal. The right ventricular  size is normal. There is mildly elevated pulmonary artery systolic  pressure. The estimated right ventricular systolic pressure is [id number] mmHg.   3. The mitral valve is normal in structure. Mild mitral valve  regurgitation.   4. The aortic valve is tricuspid. Aortic valve regurgitation is trivial.  Aortic valve sclerosis is present, with no evidence of aortic valve  stenosis.   5. The inferior vena cava is dilated in size with >50% respiratory  variability, suggesting right atrial pressure of 8 mmHg.  _______________  Left Cardiac Catheterization  12/02/2021:   Mid LAD lesion is 30% stenosed.   Prox RCA to Mid RCA lesion is 95% stenosed.   A drug-eluting stent was successfully placed using a STENT ONYX FRONTIER 2.5X18.   Post intervention, there is a 0% residual stenosis.  Impressions: Successful PCI drug-eluting stent of mid RCA the setting of non-STEMI with an excellent angiographic result.  Patient will need DAPT uninterrupted for 12 months in addition to high-dose statin therapy and focus on more aggressive treatment of her diabetes.  She left the lab in stable condition.  Diagnostic Dominance: Right Intervention   EKG:  EKG ordered today. EKG personally reviewed and demonstrates normal sinus rhythm, rate 73 bpm, with T wave inversion in inferior leads and biphasic T waves in leads V3-V4. QTc 423 ms. No significant changes compared to prior tracing.  Recent Labs: 11/30/2021: B Natriuretic Peptide 51.9 12/06/2021: ALT 15; BUN 13;  Creatinine, Ser 0.76; Hemoglobin 12.2; Platelets 418; Potassium 3.9; Sodium 139  Recent Lipid Panel    Component Value Date/Time   CHOL 221 (H) 12/01/2021 0302   TRIG 153 (H) 12/01/2021 0302   HDL 47 12/01/2021 0302   CHOLHDL 4.7 12/01/2021 0302   VLDL 31 12/01/2021 0302   LDLCALC 143 (H) 12/01/2021 0302    Physical Exam:    Vital Signs: BP 132/68   Pulse 73   Ht 5\' 10"  (1.778 m)   Wt 244 lb 12.8 oz (111 kg)   SpO2 95%   BMI 35.13 kg/m     Wt Readings from Last 3 Encounters:  12/10/21 244 lb 12.8 oz (111 kg)  12/06/21 242 lb (109.8 kg)  12/03/21 242 lb 12.8 oz (110.1 kg)     General: 66 y.o. African-American female in no acute distress. HEENT: Normocephalic and atraumatic. Sclera clear.  Neck: Supple. No JVD. Heart: RRR. Distinct S1 and S2. Soft II/VI murmur. No gallops or rubs. Right radial cath site soft with no signs of hematoma. Lungs: No increased work of breathing. Clear to ausculation bilaterally. No wheezes, rhonchi, or rales.  Abdomen: Soft, non-distended, and non-tender to palpation.  Extremities: No lower extremity edema.    Skin: Warm and dry. Neuro: Alert and oriented x3. No focal deficits. Psych: Normal affect. Responds appropriately.  Assessment:    1. Coronary artery disease involving native coronary artery of native heart without angina pectoris   2. History of non-ST elevation myocardial infarction (NSTEMI)   3. Dyspnea on exertion   4. Syncope and collapse   5. Mitral valve insufficiency, unspecified etiology   6. Primary hypertension   7. Hyperlipidemia, unspecified hyperlipidemia type   8. Type 2 diabetes mellitus with complication, without long-term current use of insulin (HCC)     Plan:    CAD with Recent NSTEMI Patient was recently admitted with NSTEMI. S/p DES to RCA. - Patient is having clear chest wall musculoskeletal pain that is worse with palpation and certain movement. However, no recurrent chest pain like what she was having prior  to NSTEMI. Nothing that sounds like angina. - Continue DAPT with Aspirin and Plavix. - Continue beta-blocker and high-intensity statin. - Recommended OTC Tylenol to help with musculoskeletal pain. Would recommended avoiding NSAIDs. Patient states heating pad helps. OK to continue using this. - Recommend Cardiac Rehab.  Dyspnea on Exertion Abnormal CT Patient reports new dyspnea on exertion since recent discharge. Echo during recent admission showed LVEF of 60-65% with normal wall motion, normal RV, mild MR. mildly elevated PASP of 39.8 mmHg. Chest CTA showed areas  of areas of ground glass attenuation and mild septal thickening bilaterally which could suggest mild interstitial edema or possible interstitial lung disease. LVEDP was 18 on cardiac catheterization.  - Euvolemic on exam. No other signs or symptoms of CHF. - Will check BNP and if elevated will start Lasix for 3 days. - Consider repeat chest CT for further evaluation of possible interstitial lung disease noted on CTA in the hospital. Recommended following up with PCP for this.   Syncope Patient had one syncopal episode prior to recent admission. This occurred after she developed severe chest pain and then stood up and felt flushed and dizzy. Daughter states she stopped breathing and started chest compressions but states she regained consciousness in less than 30 seconds. Echo during admission showed normal LV function. - No recurrence. - Sounds like it was likely vasovagal in nature. I don't think she actually had cardiac/respiratory arrest given how quickly she regained consciousness. Will order 2 week Zio monitor to assess for any concerning arrhythmias. - Discussed [place] law that states no driving for 6 months after unexplained syncopal episode. Patient voiced understanding and agreed.   Mild Mitral Regurgitation  Noted on Echo during recent admission. - Can continue routine surveillance Echos. Consider repeat Echo in 3 years (or sooner  depending on symptoms).   Hypertension BP well controlled. - Continue Lopressor 12.5mg  twice daily.  Hyperlipidemia Lipid panel during recent admission: Total Cholesterol 221, Triglycerides 153, HDL 47, LDL 143. Lipoprotein A elevated at 534. - Patient was started on Crestor 40mg  daily in the hospital. However, she state she has not been taking this because she read bad things about statins. Explained the reasoning behind a statin and she was agreeable to trying Crestor but requested that we start at 20mg  daily. I am fine with this if that means she will try the statin. - Will recheck lipid panel and LFTs in 6-8 weeks.  Type 2 Diabetes Mellitus - Uncontrolled Hemoglobin A1c during recent admission 10.0%. - Discussed that better control of her diabetes will be very important in helping prevent progression of CAD. - Management per PCP.  Disposition: Follow up in 3 months.   Medication Adjustments/Labs and Tests Ordered: Current medicines are reviewed at length with the patient today.  Concerns regarding medicines are outlined above.  Orders Placed This Encounter  Procedures   Brain natriuretic peptide   Lipid panel   Hepatic function panel   EKG 12-Lead   No orders of the defined types were placed in this encounter.   Patient Instructions  Medication Instructions:  Restart Crestor 40 mg ( Take half 0.5 Tablet Daily). *If you need a refill on your cardiac medications before your next appointment, please call your pharmacy*   Lab Work: BNP Today . Lipid Panel, hapatic Panel 6-8 weeks. If you have labs (blood work) drawn today and your tests are completely normal, you will receive your results only by: [place] (if you have MyChart) OR A paper copy in the mail If you have any lab test that is abnormal or we need to change your treatment, we will call you to review the results.   Testing/Procedures: [place]- Long Term Monitor Instructions  Your physician has requested  you wear a ZIO patch monitor for 14 days.  This is a single patch monitor. [employer] supplies one patch monitor per enrollment. Additional stickers are not available. Please do not apply patch if you will be having a Nuclear Stress Test,  Echocardiogram, Cardiac CT, MRI, or Chest  Xray during the period you would be wearing the  monitor. The patch cannot be worn during these tests. You cannot remove and re-apply the  ZIO XT patch monitor.  Your ZIO patch monitor will be mailed 3 day USPS to your address on file. It may take 3-5 days  to receive your monitor after you have been enrolled.  Once you have received your monitor, please review the enclosed instructions. Your monitor  has already been registered assigning a specific monitor serial # to you.  Billing and Patient Assistance Program Information  We have supplied [employer] with any of your insurance information on file for billing purposes. [employer] offers a sliding scale Patient Assistance Program for patients that do not have  insurance, or whose insurance does not completely cover the cost of the ZIO monitor.  You must apply for the Patient Assistance Program to qualify for this discounted rate.  To apply, please call [employer] at [phone], select option 4, select option 2, ask to apply for  Patient Assistance Program. Theodore Demark will ask your household income, and how many people  are in your household. They will quote your out-of-pocket cost based on that information.  [employer] will also be able to set up a 16-month, interest-free payment plan if needed.  Applying the monitor   Shave hair from upper left chest.  Hold abrader disc by orange tab. Rub abrader in 40 strokes over the upper left chest as  indicated in your monitor instructions.  Clean area with 4 enclosed alcohol pads. Let dry.  Apply patch as indicated in monitor instructions. Patch will be placed under collarbone on left  side of chest with arrow pointing upward.  Rub  patch adhesive wings for 2 minutes. Remove white label marked "1". Remove the white  label marked "2". Rub patch adhesive wings for 2 additional minutes.  While looking in a mirror, press and release button in center of patch. A small green light will  flash 3-4 times. This will be your only indicator that the monitor has been turned on.  Do not shower for the first 24 hours. You may shower after the first 24 hours.  Press the button if you feel a symptom. You will hear a small click. Record Date, Time and  Symptom in the Patient Logbook.  When you are ready to remove the patch, follow instructions on the last 2 pages of Patient  Logbook. Stick patch monitor onto the last page of Patient Logbook.  Place Patient Logbook in the blue and white box. Use locking tab on box and tape box closed  securely. The blue and white box has prepaid postage on it. Please place it in the mailbox as  soon as possible. Your physician should have your test results approximately 7 days after the  monitor has been mailed back to [facility].  Call [place] at [phone] if you have questions regarding  your ZIO XT patch monitor. Call them immediately if you see an orange light blinking on your  monitor.  If your monitor falls off in less than 4 days, contact our Monitor department at [phone].  If your monitor becomes loose or falls off after 4 days call [employer] at [phone] for  suggestions on securing your monitor    Follow-Up: At [facility], you and your health needs are our priority.  As part of our continuing mission to provide you with exceptional heart care, we have created designated Provider Care Teams.  These Care Teams  include your primary Cardiologist (physician) and Advanced Practice Providers (APPs -  Physician Assistants and Nurse Practitioners) who all work together to provide you with the care you need, when you need it.  We recommend signing up for the  patient portal called "MyChart".  Sign up information is provided on this After Visit Summary.  MyChart is used to connect with patients for Virtual Visits (Telemedicine).  Patients are able to view lab/test results, encounter notes, upcoming appointments, etc.  Non-urgent messages can be sent to your provider as well.   To learn more about what you can do with MyChart, go to [email or web address].    Your next appointment:   3 month(s)  The format for your next appointment:   In Person  Provider:   Sande Rives, PA-C   or, Minus Breeding, MD will plan to see you again in .      Important Information About Sugar         Signed, Eppie Gibson  12/10/2021 7:32 PM    [facility]

## 2021-12-04 ENCOUNTER — Other Ambulatory Visit (HOSPITAL_COMMUNITY): Payer: Self-pay

## 2021-12-05 ENCOUNTER — Other Ambulatory Visit (HOSPITAL_COMMUNITY): Payer: Self-pay

## 2021-12-06 ENCOUNTER — Emergency Department (HOSPITAL_COMMUNITY)
Admission: EM | Admit: 2021-12-06 | Discharge: 2021-12-07 | Disposition: A | Discharge status: Home / Self Care | Payer: 59 | Attending: Emergency Medicine | Admitting: Emergency Medicine

## 2021-12-06 ENCOUNTER — Encounter (HOSPITAL_COMMUNITY): Payer: Self-pay | Admitting: Emergency Medicine

## 2021-12-06 ENCOUNTER — Other Ambulatory Visit: Payer: Self-pay

## 2021-12-06 ENCOUNTER — Emergency Department (HOSPITAL_COMMUNITY): Payer: 59

## 2021-12-06 DIAGNOSIS — R0789 Other chest pain: Secondary | ICD-10-CM

## 2021-12-06 DIAGNOSIS — Z7902 Long term (current) use of antithrombotics/antiplatelets: Secondary | ICD-10-CM | POA: Diagnosis not present

## 2021-12-06 DIAGNOSIS — R0781 Pleurodynia: Secondary | ICD-10-CM | POA: Diagnosis not present

## 2021-12-06 DIAGNOSIS — Z7982 Long term (current) use of aspirin: Secondary | ICD-10-CM | POA: Diagnosis not present

## 2021-12-06 DIAGNOSIS — R0602 Shortness of breath: Secondary | ICD-10-CM | POA: Insufficient documentation

## 2021-12-06 DIAGNOSIS — R739 Hyperglycemia, unspecified: Secondary | ICD-10-CM | POA: Insufficient documentation

## 2021-12-06 DIAGNOSIS — R059 Cough, unspecified: Secondary | ICD-10-CM | POA: Diagnosis not present

## 2021-12-06 LAB — COMPREHENSIVE METABOLIC PANEL
ALT: 15 U/L (ref 0–44)
AST: 14 U/L — ABNORMAL LOW (ref 15–41)
Albumin: 3.5 g/dL (ref 3.5–5.0)
Alkaline Phosphatase: 89 U/L (ref 38–126)
Anion gap: 7 (ref 5–15)
BUN: 13 mg/dL (ref 8–23)
CO2: 25 mmol/L (ref 22–32)
Calcium: 9.2 mg/dL (ref 8.9–10.3)
Chloride: 107 mmol/L (ref 98–111)
Creatinine, Ser: 0.76 mg/dL (ref 0.44–1.00)
GFR, Estimated: >60 mL/min (ref >60)
Glucose, Bld: 234 mg/dL — ABNORMAL HIGH (ref 70–99)
Potassium: 3.9 mmol/L (ref 3.5–5.1)
Sodium: 139 mmol/L (ref 135–145)
Total Bilirubin: 0.4 mg/dL (ref 0.3–1.2)
Total Protein: 7.2 g/dL (ref 6.5–8.1)

## 2021-12-06 LAB — CBC WITH DIFFERENTIAL/PLATELET
Abs Immature Granulocytes: 0.02 10*3/uL (ref 0.00–0.07)
Basophils Absolute: 0 10*3/uL (ref 0.0–0.1)
Basophils Relative: 1 %
Eosinophils Absolute: 0.2 10*3/uL (ref 0.0–0.5)
Eosinophils Relative: 3 %
HCT: 38.8 % (ref 36.0–46.0)
Hemoglobin: 12.2 g/dL (ref 12.0–15.0)
Immature Granulocytes: 0 %
Lymphocytes Relative: 44 %
Lymphs Abs: 3.1 10*3/uL (ref 0.7–4.0)
MCH: 26.9 pg (ref 26.0–34.0)
MCHC: 31.4 g/dL (ref 30.0–36.0)
MCV: 85.5 fL (ref 80.0–100.0)
Monocytes Absolute: 0.6 10*3/uL (ref 0.1–1.0)
Monocytes Relative: 8 %
Neutro Abs: 3.1 10*3/uL (ref 1.7–7.7)
Neutrophils Relative %: 44 %
Platelets: 418 10*3/uL — ABNORMAL HIGH (ref 150–400)
RBC: 4.54 MIL/uL (ref 3.87–5.11)
RDW: 13.4 % (ref 11.5–15.5)
WBC: 7.1 10*3/uL (ref 4.0–10.5)
nRBC: 0 % (ref 0.0–0.2)

## 2021-12-06 LAB — TROPONIN I (HIGH SENSITIVITY): Troponin I (High Sensitivity): 88 ng/L — ABNORMAL HIGH (ref <18)

## 2021-12-06 MED ORDER — OXYCODONE-ACETAMINOPHEN 5-325 MG PO TABS
2.0000 | ORAL_TABLET | Freq: Once | ORAL | Status: AC
  Administered 2021-12-06: 2 via ORAL
  Filled 2021-12-06: qty 2

## 2021-12-06 MED ORDER — OXYCODONE-ACETAMINOPHEN 5-325 MG PO TABS: 1.0000 | ORAL_TABLET | ORAL | 0 refills | Status: DC | PRN

## 2021-12-06 MED ORDER — MORPHINE SULFATE (PF) 4 MG/ML IV SOLN
4.0000 mg | Freq: Once | INTRAVENOUS | Status: DC
  Filled 2021-12-06: qty 1

## 2021-12-06 NOTE — ED Provider Notes (Signed)
[facility]-EMERGENCY DEPT Provider Note   CSN: [id number] Arrival date & time: 12/06/21  2047     History  Chief Complaint  Patient presents with   Chest Pain   Shortness of Breath    Karina Pierce is a 67 y.o. female.  HPI 66 year old female presents with right-sided chest wall pain.  Patient's been dealing with pain to her inferior lower chest bilaterally since 6/3.  At that time she apparently passed out and her daughter did CPR.  Daughter states she did CPR for about 30 seconds.  Since waking up she has been having bilateral lower rib pain.  This has progressively worsened, even since discharge on 6/6.  During her hospitalization she had a heart cath which resulted in a stent to her RCA.  She reports some occasional shortness of breath and occasional cough but mostly it is positional pain.  Any type of movement makes it worse.  She has not taken much Tylenol.  No abdominal symptoms.  Home Medications Prior to Admission medications   Medication Sig Start Date End Date Taking? Authorizing Provider  acetaminophen (TYLENOL) 500 MG tablet Take 1 tablet (500 mg total) by mouth every 6 (six) hours as needed. Patient taking differently: Take 500 mg by mouth every 6 (six) hours as needed for moderate pain. 10/18/20  Yes Lorelee New, PA-C  aspirin EC 81 MG tablet Take 1 tablet (81 mg total) by mouth daily. Swallow whole. 12/03/21  Yes Arty Baumgartner, NP  clopidogrel (PLAVIX) 75 MG tablet Take 1 tablet (75 mg total) by mouth daily. 12/04/21  Yes Arty Baumgartner, NP  glipiZIDE (GLUCOTROL XL) 10 MG 24 hr tablet Take 10 mg by mouth daily with breakfast.   Yes [provider]  metoprolol tartrate (LOPRESSOR) 25 MG tablet Take 0.5 tablets (12.5 mg total) by mouth 2 (two) times daily. 12/03/21  Yes Arty Baumgartner, NP  nitroGLYCERIN (NITROSTAT) 0.4 MG SL tablet Place 1 tablet (0.4 mg total) under the tongue every 5 (five) minutes x 3 doses as needed for chest pain.  12/03/21  Yes Arty Baumgartner, NP  oxyCODONE-acetaminophen (PERCOCET) 5-325 MG tablet Take 1 tablet by mouth every 4 (four) hours as needed. 12/06/21  Yes Pricilla Loveless, MD  rosuvastatin (CRESTOR) 40 MG tablet Take 1 tablet (40 mg total) by mouth daily at 6 PM. 12/03/21  Yes Arty Baumgartner, NP      Allergies    Patient has no known allergies.    Review of Systems   Review of Systems  Cardiovascular:  Positive for chest pain.  Gastrointestinal:  Negative for abdominal pain.    Physical Exam Updated Vital Signs BP (!) 143/71   Pulse 69   Temp 98.2 F (36.8 C) (Oral)   Resp 14   Ht 5\' 10"  (1.778 m)   Wt 109.8 kg   SpO2 99%   BMI 34.72 kg/m  Physical Exam Vitals and nursing note reviewed.  Constitutional:      Appearance: She is well-developed.  HENT:     Head: Normocephalic and atraumatic.  Cardiovascular:     Rate and Rhythm: Normal rate and regular rhythm.     Heart sounds: Normal heart sounds.  Pulmonary:     Effort: Pulmonary effort is normal.     Breath sounds: Normal breath sounds.  Chest:     Chest wall: Tenderness present. No deformity, swelling or crepitus.    Abdominal:     Palpations: Abdomen is soft.  Tenderness: There is no abdominal tenderness.  Skin:    General: Skin is warm and dry.  Neurological:     Mental Status: She is alert.     ED Results / Procedures / Treatments   Labs (all labs ordered are listed, but only abnormal results are displayed) Labs Reviewed  COMPREHENSIVE METABOLIC PANEL - Abnormal; Notable for the following components:      Result Value   Glucose, Bld 234 (*)    AST 14 (*)    All other components within normal limits  CBC WITH DIFFERENTIAL/PLATELET - Abnormal; Notable for the following components:   Platelets 418 (*)    All other components within normal limits  TROPONIN I (HIGH SENSITIVITY) - Abnormal; Notable for the following components:   Troponin I (High Sensitivity) 88 (*)    All other components within  normal limits  TROPONIN I (HIGH SENSITIVITY)    EKG EKG Interpretation  Date/Time:  Friday December 06 2021 20:57:30 EDT Ventricular Rate:  72 PR Interval:  141 QRS Duration: 90 QT Interval:  382 QTC Calculation: 418 R Axis:   15 Text Interpretation: Sinus rhythm Abnormal T, consider ischemia, inferior leads similar to December 03 2021 Confirmed by Pricilla Loveless [phone]) on 12/06/2021 9:50:30 PM  Radiology DG Ribs Unilateral W/Chest Right  Result Date: 12/06/2021 CLINICAL DATA:  Rib pain. EXAM: RIGHT RIBS AND CHEST - 3+ VIEW COMPARISON:  Chest x-ray 11/30/2021 FINDINGS: No fracture or other bone lesions are seen involving the ribs. There is no evidence of pneumothorax or pleural effusion. Both lungs are clear. Heart size and mediastinal contours are within normal limits. IMPRESSION: Negative. Electronically Signed   By: Darliss Cheney M.D.   On: 12/06/2021 21:55    Procedures Procedures    Medications Ordered in ED Medications  oxyCODONE-acetaminophen (PERCOCET/ROXICET) 5-325 MG per tablet 2 tablet (2 tablets Oral Given 12/06/21 2206)    ED Course/ Medical Decision Making/ A&P                           Medical Decision Making Amount and/or Complexity of Data Reviewed External Data Reviewed: labs and notes. Labs: ordered. Radiology: ordered and independent interpretation performed. ECG/medicine tests: ordered and independent interpretation performed.  Risk Prescription drug management.   Patient presents with what seems to be chest wall pain.  This is likely related to the compressions she received 6 days ago.  She did at that time have a NSTEMI, however she was having this pain as well.  ECG shows expected evolution and is unchanged from the hospital.  Chest x-ray obtained and there is no obvious rib fracture.  I personally viewed/interpreted these images.  Labs show some hyperglycemia as well as a troponin of 88 but this is substantially lower than when she had her infarction.  We will  get a second though I do doubt this is ACS.  I think PE is unlikely.  Chart review shows she had a CT angiography to rule out PE the day this happened and it was negative.  Of note they did not see any rib fractures either. Care transferred to Dr. Pilar Plate.        Final Clinical Impression(s) / ED Diagnoses Final diagnoses:  Chest wall pain    Rx / DC Orders ED Discharge Orders          Ordered    oxyCODONE-acetaminophen (PERCOCET) 5-325 MG tablet  Every 4 hours PRN  12/06/21 2231              Pricilla LovelessGoldston, Mitchelle Sultan, MD 12/06/21 2309

## 2021-12-06 NOTE — ED Triage Notes (Signed)
  Patient comes in with R sided chest pain and SOB.  Patient had NSTEMI last week and stent placed at [facility], was discharged on 6/7.  Patient states she has had the chest pain and SOB since she was discharged but today it was progressively worse.  Pain 8/10, stabbing pain.  No diaphoresis, or N/V.  No radiating pain.

## 2021-12-06 NOTE — ED Provider Notes (Signed)
  Provider Note MRN:  [id number]  Arrival date & time: 12/07/21    ED Course and Medical Decision Making  Assumed care from Dr. Criss Alvine at shift change.  History of recent NSTEMI here with chest pain that is favored to be musculoskeletal with, received some brief CPR few weeks ago.  Awaiting second troponin.  Repeat troponin is downtrending, patient is appropriate for discharge.  Procedures  Final Clinical Impressions(s) / ED Diagnoses     ICD-10-CM   1. Chest wall pain  R07.89       ED Discharge Orders          Ordered    oxyCODONE-acetaminophen (PERCOCET) 5-325 MG tablet  Every 4 hours PRN        12/06/21 2231              Discharge Instructions      You were evaluated in the Emergency Department and after careful evaluation, we did not find any emergent condition requiring admission or further testing in the hospital.  Your exam/testing today was overall reassuring.  Symptoms seem to be due to soreness of the chest wall.  Your repeat blood testing is reassuring.  Recommend follow-up with your regular doctors.  Please return to the Emergency Department if you experience any worsening of your condition.  Thank you for allowing [place] to be a part of your care.       Elmer Sow. Pilar Plate, MD [facility] Emergency Medicine [facility] [email or web address]    Sabas Sous, MD 12/07/21 [phone]

## 2021-12-07 LAB — TROPONIN I (HIGH SENSITIVITY): Troponin I (High Sensitivity): 82 ng/L — ABNORMAL HIGH (ref <18)

## 2021-12-07 NOTE — Discharge Instructions (Signed)
You were evaluated in the Emergency Department and after careful evaluation, we did not find any emergent condition requiring admission or further testing in the hospital.  Your exam/testing today was overall reassuring.  Symptoms seem to be due to soreness of the chest wall.  Your repeat blood testing is reassuring.  Recommend follow-up with your regular doctors.  Please return to the Emergency Department if you experience any worsening of your condition.  Thank you for allowing [place] to be a part of your care.

## 2021-12-10 ENCOUNTER — Ambulatory Visit (INDEPENDENT_AMBULATORY_CARE_PROVIDER_SITE_OTHER): Payer: 59

## 2021-12-10 ENCOUNTER — Other Ambulatory Visit: Payer: Self-pay | Admitting: Student

## 2021-12-10 ENCOUNTER — Ambulatory Visit (INDEPENDENT_AMBULATORY_CARE_PROVIDER_SITE_OTHER): Payer: 59 | Admitting: Student

## 2021-12-10 ENCOUNTER — Encounter: Payer: Self-pay | Admitting: Student

## 2021-12-10 VITALS — BP 132/68 | HR 73 | Ht 70.0 in | Wt 244.8 lb

## 2021-12-10 DIAGNOSIS — I340 Nonrheumatic mitral (valve) insufficiency: Secondary | ICD-10-CM

## 2021-12-10 DIAGNOSIS — I10 Essential (primary) hypertension: Secondary | ICD-10-CM

## 2021-12-10 DIAGNOSIS — R55 Syncope and collapse: Secondary | ICD-10-CM

## 2021-12-10 DIAGNOSIS — I252 Old myocardial infarction: Secondary | ICD-10-CM

## 2021-12-10 DIAGNOSIS — R0609 Other forms of dyspnea: Secondary | ICD-10-CM

## 2021-12-10 DIAGNOSIS — I2510 Atherosclerotic heart disease of native coronary artery without angina pectoris: Secondary | ICD-10-CM

## 2021-12-10 DIAGNOSIS — E785 Hyperlipidemia, unspecified: Secondary | ICD-10-CM

## 2021-12-10 DIAGNOSIS — E118 Type 2 diabetes mellitus with unspecified complications: Secondary | ICD-10-CM

## 2021-12-10 NOTE — Patient Instructions (Signed)
Medication Instructions:  Restart Crestor 40 mg ( Take half 0.5 Tablet Daily). *If you need a refill on your cardiac medications before your next appointment, please call your pharmacy*   Lab Work: BNP Today . Lipid Panel, hapatic Panel 6-8 weeks. If you have labs (blood work) drawn today and your tests are completely normal, you will receive your results only by: [place] (if you have MyChart) OR A paper copy in the mail If you have any lab test that is abnormal or we need to change your treatment, we will call you to review the results.   Testing/Procedures: [place]- Long Term Monitor Instructions  Your physician has requested you wear a ZIO patch monitor for 14 days.  This is a single patch monitor. [employer] supplies one patch monitor per enrollment. Additional stickers are not available. Please do not apply patch if you will be having a Nuclear Stress Test,  Echocardiogram, Cardiac CT, MRI, or Chest Xray during the period you would be wearing the  monitor. The patch cannot be worn during these tests. You cannot remove and re-apply the  ZIO XT patch monitor.  Your ZIO patch monitor will be mailed 3 day USPS to your address on file. It may take 3-5 days  to receive your monitor after you have been enrolled.  Once you have received your monitor, please review the enclosed instructions. Your monitor  has already been registered assigning a specific monitor serial # to you.  Billing and Patient Assistance Program Information  We have supplied [employer] with any of your insurance information on file for billing purposes. [employer] offers a sliding scale Patient Assistance Program for patients that do not have  insurance, or whose insurance does not completely cover the cost of the ZIO monitor.  You must apply for the Patient Assistance Program to qualify for this discounted rate.  To apply, please call [employer] at [phone], select option 4, select option 2, ask to apply for   Patient Assistance Program. Theodore Demark will ask your household income, and how many people  are in your household. They will quote your out-of-pocket cost based on that information.  [employer] will also be able to set up a 45-month, interest-free payment plan if needed.  Applying the monitor   Shave hair from upper left chest.  Hold abrader disc by orange tab. Rub abrader in 40 strokes over the upper left chest as  indicated in your monitor instructions.  Clean area with 4 enclosed alcohol pads. Let dry.  Apply patch as indicated in monitor instructions. Patch will be placed under collarbone on left  side of chest with arrow pointing upward.  Rub patch adhesive wings for 2 minutes. Remove white label marked "1". Remove the white  label marked "2". Rub patch adhesive wings for 2 additional minutes.  While looking in a mirror, press and release button in center of patch. A small green light will  flash 3-4 times. This will be your only indicator that the monitor has been turned on.  Do not shower for the first 24 hours. You may shower after the first 24 hours.  Press the button if you feel a symptom. You will hear a small click. Record Date, Time and  Symptom in the Patient Logbook.  When you are ready to remove the patch, follow instructions on the last 2 pages of Patient  Logbook. Stick patch monitor onto the last page of Patient Logbook.  Place Patient Logbook in the blue and white box. Use  locking tab on box and tape box closed  securely. The blue and white box has prepaid postage on it. Please place it in the mailbox as  soon as possible. Your physician should have your test results approximately 7 days after the  monitor has been mailed back to [facility].  Call [place] at [phone] if you have questions regarding  your ZIO XT patch monitor. Call them immediately if you see an orange light blinking on your  monitor.  If your monitor falls off in less than 4  days, contact our Monitor department at [phone].  If your monitor becomes loose or falls off after 4 days call [employer] at [phone] for  suggestions on securing your monitor    Follow-Up: At [facility], you and your health needs are our priority.  As part of our continuing mission to provide you with exceptional heart care, we have created designated Provider Care Teams.  These Care Teams include your primary Cardiologist (physician) and Advanced Practice Providers (APPs -  Physician Assistants and Nurse Practitioners) who all work together to provide you with the care you need, when you need it.  We recommend signing up for the patient portal called "MyChart".  Sign up information is provided on this After Visit Summary.  MyChart is used to connect with patients for Virtual Visits (Telemedicine).  Patients are able to view lab/test results, encounter notes, upcoming appointments, etc.  Non-urgent messages can be sent to your provider as well.   To learn more about what you can do with MyChart, go to [email or web address].    Your next appointment:   3 month(s)  The format for your next appointment:   In Person  Provider:   Sande Rives, PA-C   or, Minus Breeding, MD will plan to see you again in .      Important Information About [place]

## 2021-12-10 NOTE — Progress Notes (Unsigned)
Enrolled patient for a 14 day Zio XT monitor to be mailed to patients home  Dr Hochrein to read 

## 2021-12-11 LAB — BRAIN NATRIURETIC PEPTIDE: BNP: 81 pg/mL (ref 0.0–100.0)

## 2021-12-12 ENCOUNTER — Telehealth (HOSPITAL_COMMUNITY): Payer: Self-pay

## 2021-12-12 NOTE — Telephone Encounter (Signed)
Called patient to see if she is interested in the Cardiac Rehab Program. Patient expressed interest. Explained scheduling process and went over insurance, patient verbalized understanding. Will contact patient for scheduling once f/u has been completed. 

## 2021-12-12 NOTE — Telephone Encounter (Signed)
Pt insurance is active and benefits verified through [employer]. Co-pay $0.00, DED $233.00/$233.00 met, out of pocket $8,300.00/$388.36 met, co-insurance 20%. No pre-authorization required. Passport, 12/12/21 @ 2:41PM, REF#[id number]   How many CR sessions are covered? (36 sessions for TCR, 72 sessions for ICR)72 Is this a lifetime maximum or an annual maximum? Lifetime Has the member used any of these services to date? 0 Is there a time limit (weeks/months) on start of program and/or program completion? No   Will contact patient to see if she is interested in the Cardiac Rehab Program. If interested, patient will need to complete follow up appt. Once completed, patient will be contacted for scheduling upon review by the RN Navigator. 

## 2021-12-20 ENCOUNTER — Other Ambulatory Visit (HOSPITAL_COMMUNITY): Payer: Self-pay

## 2021-12-20 ENCOUNTER — Telehealth (HOSPITAL_COMMUNITY): Payer: Self-pay

## 2021-12-20 DIAGNOSIS — I252 Old myocardial infarction: Secondary | ICD-10-CM

## 2021-12-20 DIAGNOSIS — I2510 Atherosclerotic heart disease of native coronary artery without angina pectoris: Secondary | ICD-10-CM | POA: Diagnosis not present

## 2021-12-20 DIAGNOSIS — R55 Syncope and collapse: Secondary | ICD-10-CM | POA: Diagnosis not present

## 2021-12-20 DIAGNOSIS — E785 Hyperlipidemia, unspecified: Secondary | ICD-10-CM

## 2021-12-20 DIAGNOSIS — I10 Essential (primary) hypertension: Secondary | ICD-10-CM

## 2021-12-20 DIAGNOSIS — E118 Type 2 diabetes mellitus with unspecified complications: Secondary | ICD-10-CM

## 2021-12-24 ENCOUNTER — Telehealth (HOSPITAL_COMMUNITY): Payer: Self-pay

## 2021-12-24 NOTE — Telephone Encounter (Signed)
Transitions of Care Pharmacy   Call attempted for a pharmacy transitions of care follow-up. Unable to leave voicemail.   Call attempt #2. Will follow-up in 2-3 days.    

## 2021-12-25 ENCOUNTER — Telehealth (HOSPITAL_COMMUNITY): Payer: Self-pay

## 2021-12-25 NOTE — Telephone Encounter (Signed)
Transitions of Care Pharmacy   Call attempted for a pharmacy transitions of care follow-up. Unable to leave voicemail.  Call attempt #3.   

## 2022-01-01 DIAGNOSIS — Z006 Encounter for examination for normal comparison and control in clinical research program: Secondary | ICD-10-CM

## 2022-01-01 NOTE — Research (Signed)
Spoke with patient about the Ocean(a) trial and sending information via e-mail for her to review. Will call back to see if she is interested in participating.

## 2022-01-23 ENCOUNTER — Telehealth: Payer: Self-pay | Admitting: Cardiology

## 2022-01-23 NOTE — Telephone Encounter (Signed)
Patient notified of PA advice. Routed note to PCP

## 2022-01-23 NOTE — Telephone Encounter (Signed)
Patient states she received a call from [place].  Didn't see anything documented in Epic.

## 2022-01-23 NOTE — Telephone Encounter (Signed)
Yes ma'am, I am OK with her PCP prescribing Albuterol.  Thank you!

## 2022-01-23 NOTE — Telephone Encounter (Signed)
Patient called with monitor results  She would like to know if OK to be prescribed albuterol nebulizer by PCP, given monitor findings. Will defer to PA

## 2022-01-24 DIAGNOSIS — Z006 Encounter for examination for normal comparison and control in clinical research program: Secondary | ICD-10-CM

## 2022-01-24 NOTE — Research (Signed)
Follow up call made to patient about the Ocean(a) clinical trial. She isn't interested in participating at this time.

## 2022-02-03 ENCOUNTER — Telehealth: Payer: Self-pay | Admitting: Cardiology

## 2022-02-03 NOTE — Telephone Encounter (Signed)
Patient stated [facility] [facility] needs precertification for cardiac rehab. CPT [id number]. Phone [phone]. Patient wants to be notified when she has the certification for rehab.

## 2022-02-03 NOTE — Telephone Encounter (Signed)
Patient states her insurance is requiring a prior auth for cariac rehab.

## 2022-02-11 ENCOUNTER — Telehealth (HOSPITAL_COMMUNITY): Payer: Self-pay | Admitting: *Deleted

## 2022-02-11 ENCOUNTER — Inpatient Hospital Stay (HOSPITAL_COMMUNITY): Admission: RE | Admit: 2022-02-11 | Payer: 59 | Source: Ambulatory Visit

## 2022-02-11 NOTE — Telephone Encounter (Signed)
Pt scheduled for orientation to cardiac rehab today.  Pt called after the appointment time that she would not be able to make it today.  Pressed for any medical complaints - pt denied and simply stated "I can't get there today and I would like to reschedule." Will forward to support staff.Alanson Aly, BSN Cardiac and Emergency planning/management officer

## 2022-02-12 ENCOUNTER — Other Ambulatory Visit (HOSPITAL_COMMUNITY): Payer: Self-pay

## 2022-02-14 ENCOUNTER — Other Ambulatory Visit: Payer: Self-pay

## 2022-02-14 MED ORDER — METOPROLOL TARTRATE 25 MG PO TABS: 12.5000 mg | ORAL_TABLET | Freq: Two times a day (BID) | ORAL | 0 refills | Status: DC

## 2022-02-17 ENCOUNTER — Ambulatory Visit (HOSPITAL_COMMUNITY): Payer: 59

## 2022-02-19 ENCOUNTER — Ambulatory Visit (HOSPITAL_COMMUNITY): Payer: 59

## 2022-02-21 ENCOUNTER — Ambulatory Visit (HOSPITAL_COMMUNITY): Payer: 59

## 2022-02-24 ENCOUNTER — Ambulatory Visit (HOSPITAL_COMMUNITY): Payer: 59

## 2022-02-26 ENCOUNTER — Ambulatory Visit (HOSPITAL_COMMUNITY): Payer: 59

## 2022-02-28 ENCOUNTER — Ambulatory Visit (HOSPITAL_COMMUNITY): Payer: 59

## 2022-03-05 ENCOUNTER — Ambulatory Visit (HOSPITAL_COMMUNITY): Payer: 59

## 2022-03-07 ENCOUNTER — Ambulatory Visit (HOSPITAL_COMMUNITY): Payer: 59

## 2022-03-10 ENCOUNTER — Ambulatory Visit (HOSPITAL_COMMUNITY): Payer: 59

## 2022-03-12 ENCOUNTER — Ambulatory Visit (HOSPITAL_COMMUNITY): Payer: 59

## 2022-03-12 DIAGNOSIS — I2510 Atherosclerotic heart disease of native coronary artery without angina pectoris: Secondary | ICD-10-CM | POA: Insufficient documentation

## 2022-03-12 NOTE — Progress Notes (Signed)
Cardiology Office Note   Date:  03/14/2022   ID:  Karina Pierce, DOB 1956-06-23, MRN [id number]  PCP:  Leilani Able, MD  Cardiologist:   Rollene Rotunda, MD   Chief Complaint  Patient presents with   Coronary Artery Disease      History of Present Illness: Karina Pierce is a 66 y.o. female who presents for non-STEMI with DES to the RCA.  This happened in June.  She presented initially with syncope.  with a history of CAD with recent NSTEMI on 11/30/2021 s/p DES to RCA, hypertension, hyperlipidemia, type 2 diabetes mellitus, anemia, and former tobacco abuse who is followed by Dr. Antoine Poche and presents today for hospital follow-up after NSTEMI. Chest CTA was negative for PE but did show areas of ground glass attenuation and mild septal thickening bilaterally which could suggest mild interstitial edema or possible interstitial lung disease. Echo showed LVEF of 60-65% with normal wall motion, mild MR, and mildly elevated PASP of 39.8 mmHg. LHC on 6/5 showed 95% stenosis of proximal to mid RCA and 30% stenosis of mid LAD. She underwent successful PCI with DES to RCA. She initially started on DAPT with Aspirin and Brilinta but developed significant dyspnea so was transitioned to Plavix prior to discharge. She was also started on a beta-blocker and high-intensity statin.  She has been doing okay since she was last seen.  She has been doing some exercises.  The patient denies any new symptoms such as chest discomfort, neck or arm discomfort. There has been no new shortness of breath, PND or orthopnea. There have been no reported palpitations, presyncope or syncope.   Past Medical History:  Diagnosis Date   Diabetes mellitus without complication [facility])     Past Surgical History:  Procedure Laterality Date   CORONARY STENT INTERVENTION N/A 12/02/2021   Procedure: CORONARY STENT INTERVENTION;  Surgeon: Runell Gess, MD;  Location: [facility] INVASIVE CV LAB;  Service: Cardiovascular;  Laterality: N/A;    KNEE SURGERY     LEFT HEART CATH AND CORONARY ANGIOGRAPHY N/A 12/02/2021   Procedure: LEFT HEART CATH AND CORONARY ANGIOGRAPHY;  Surgeon: Runell Gess, MD;  Location: [facility] INVASIVE CV LAB;  Service: Cardiovascular;  Laterality: N/A;     Current Outpatient Medications  Medication Sig Dispense Refill   acetaminophen (TYLENOL) 500 MG tablet Take 1 tablet (500 mg total) by mouth every 6 (six) hours as needed. (Patient taking differently: Take 500 mg by mouth every 6 (six) hours as needed for moderate pain.) 30 tablet 0   aspirin EC 81 MG tablet Take 1 tablet (81 mg total) by mouth daily. Swallow whole. 30 tablet 12   clopidogrel (PLAVIX) 75 MG tablet Take 1 tablet (75 mg total) by mouth daily. 90 tablet 2   glipiZIDE (GLUCOTROL XL) 10 MG 24 hr tablet Take 10 mg by mouth daily with breakfast.     metoprolol tartrate (LOPRESSOR) 25 MG tablet Take 0.5 tablets (12.5 mg total) by mouth 2 (two) times daily. 90 tablet 0   nitroGLYCERIN (NITROSTAT) 0.4 MG SL tablet Place 1 tablet (0.4 mg total) under the tongue every 5 (five) minutes x 3 doses as needed for chest pain. 25 tablet 2   oxyCODONE-acetaminophen (PERCOCET) 5-325 MG tablet Take 1 tablet by mouth every 4 (four) hours as needed. 10 tablet 0   rosuvastatin (CRESTOR) 40 MG tablet Take 1 tablet (40 mg total) by mouth daily at 6 PM. (Patient taking differently: Take 20 mg by mouth daily at 6 PM.  Take half 0.5 Tablet Daily in The Evening) 90 tablet 0   escitalopram (LEXAPRO) 10 MG tablet Take 10 mg by mouth daily.     FARXIGA 10 MG TABS tablet Take 10 mg by mouth daily.     metFORMIN (GLUCOPHAGE) 500 MG tablet Take 500 mg by mouth 2 (two) times daily.     No current facility-administered medications for this visit.    Allergies:   Patient has no known allergies.    ROS:  Please see the history of present illness.   Otherwise, review of systems are positive for none.   All other systems are reviewed and negative.    PHYSICAL EXAM: VS:  BP 132/64    Pulse 68   Ht 5\' 9"  (1.753 m)   Wt 246 lb 6.4 oz (111.8 kg)   SpO2 99%   BMI 36.39 kg/m  , BMI Body mass index is 36.39 kg/m. NECK:  No jugular venous distention, waveform within normal limits, carotid upstroke brisk and symmetric, no bruits, no thyromegaly LUNGS:  Clear to auscultation bilaterally BACK:  No CVA tenderness CHEST:  Unremarkable HEART:  PMI not displaced or sustained,S1 and S2 within normal limits, no S3, no S4, no clicks, no rubs, 2 out of 6 brief apical systolic murmur radiating slightly at the aortic outflow tract, no diastolic murmurs ABD:  Flat, positive bowel sounds normal in frequency in pitch, no bruits, no rebound, no guarding, no midline pulsatile mass, no hepatomegaly, no splenomegaly EXT:  2 plus pulses throughout, no edema, no cyanosis no clubbing   EKG:  EKG is not ordered today.    Recent Labs: 12/06/2021: ALT 15; BUN 13; Creatinine, Ser 0.76; Hemoglobin 12.2; Platelets 418; Potassium 3.9; Sodium 139 12/10/2021: BNP 81.0    Lipid Panel    Component Value Date/Time   CHOL 221 (H) 12/01/2021 0302   TRIG 153 (H) 12/01/2021 0302   HDL 47 12/01/2021 0302   CHOLHDL 4.7 12/01/2021 0302   VLDL 31 12/01/2021 0302   LDLCALC 143 (H) 12/01/2021 0302      Wt Readings from Last 3 Encounters:  03/14/22 246 lb 6.4 oz (111.8 kg)  12/10/21 244 lb 12.8 oz (111 kg)  12/06/21 242 lb (109.8 kg)     Cardiac cath  June 2023  Diagnostic Dominance: Right Intervention    Echo: 11/30/21  1. Left ventricular ejection fraction, by estimation, is 60 to 65%. The  left ventricle has normal function. The left ventricle has no regional  wall motion abnormalities. Left ventricular diastolic parameters are  indeterminate.   2. Right ventricular systolic function is normal. The right ventricular  size is normal. There is mildly elevated pulmonary artery systolic  pressure. The estimated right ventricular systolic pressure is 39.8 mmHg.   3. The mitral valve is normal  in structure. Mild mitral valve  regurgitation.   4. The aortic valve is tricuspid. Aortic valve regurgitation is trivial.  Aortic valve sclerosis is present, with no evidence of aortic valve  stenosis.   5. The inferior vena cava is dilated in size with >50% respiratory  variability, suggesting right atrial pressure of 8 mmHg.    Other studies Reviewed: Additional studies/ records that were reviewed today include: Labs. Review of the above records demonstrates:  Please see elsewhere in the note.     ASSESSMENT AND PLAN:  CAD:  The patient has no new sypmtoms.  No further cardiovascular testing is indicated.  We will continue with aggressive risk reduction and meds as listed.  Dyslipidemia: 143 prior to the Crestor was her LDL.  She will come back for fasting lipid profile.  DM: Her A1c was 10.  Her primary provider had suggested [facility] and I agree with that.  Syncope: She had no further syncope and will not be driving until 6 months after the event.   Current medicines are reviewed at length with the patient today.  The patient does not have concerns regarding medicines.  The following changes have been made:  no change  Labs/ tests ordered today include:   Orders Placed This Encounter  Procedures   Lipid panel   AMB referral to cardiac rehabilitation     Disposition:   FU with Marjie Skiff in December.    Signed, Rollene Rotunda, MD  03/14/2022 9:08 AM    [facility]

## 2022-03-14 ENCOUNTER — Ambulatory Visit: Payer: 59 | Attending: Cardiology | Admitting: Cardiology

## 2022-03-14 ENCOUNTER — Encounter: Payer: Self-pay | Admitting: Cardiology

## 2022-03-14 VITALS — BP 132/64 | HR 68 | Ht 69.0 in | Wt 246.4 lb

## 2022-03-14 DIAGNOSIS — E785 Hyperlipidemia, unspecified: Secondary | ICD-10-CM

## 2022-03-14 DIAGNOSIS — I2510 Atherosclerotic heart disease of native coronary artery without angina pectoris: Secondary | ICD-10-CM | POA: Diagnosis not present

## 2022-03-14 DIAGNOSIS — E118 Type 2 diabetes mellitus with unspecified complications: Secondary | ICD-10-CM

## 2022-03-14 NOTE — Patient Instructions (Signed)
Medication Instructions:  Your physician recommends that you continue on your current medications as directed. Please refer to the Current Medication list given to you today.  *If you need a refill on your cardiac medications before your next appointment, please call your pharmacy*   Lab Work: When fasting: Lipids If you have labs (blood work) drawn today and your tests are completely normal, you will receive your results only by: MyChart Message (if you have MyChart) OR A paper copy in the mail If you have any lab test that is abnormal or we need to change your treatment, we will call you to review the results.   Testing/Procedures: None   Follow-Up: At [facility], you and your health needs are our priority.  As part of our continuing mission to provide you with exceptional heart care, we have created designated Provider Care Teams.  These Care Teams include your primary Cardiologist (physician) and Advanced Practice Providers (APPs -  Physician Assistants and Nurse Practitioners) who all work together to provide you with the care you need, when you need it.  We recommend signing up for the patient portal called "MyChart".  Sign up information is provided on this After Visit Summary.  MyChart is used to connect with patients for Virtual Visits (Telemedicine).  Patients are able to view lab/test results, encounter notes, upcoming appointments, etc.  Non-urgent messages can be sent to your provider as well.   To learn more about what you can do with MyChart, go to [email or web address].    Your next appointment:   6 month(s)  The format for your next appointment:   In Person  Provider:   Marjie Skiff, PA-C     Other Instructions   Important Information About Sugar

## 2022-03-17 ENCOUNTER — Ambulatory Visit (HOSPITAL_COMMUNITY): Payer: 59

## 2022-03-19 ENCOUNTER — Ambulatory Visit (HOSPITAL_COMMUNITY): Payer: 59

## 2022-03-21 ENCOUNTER — Ambulatory Visit (HOSPITAL_COMMUNITY): Payer: 59

## 2022-03-24 ENCOUNTER — Ambulatory Visit (HOSPITAL_COMMUNITY): Payer: 59

## 2022-03-26 ENCOUNTER — Ambulatory Visit (HOSPITAL_COMMUNITY): Payer: 59

## 2022-03-27 ENCOUNTER — Telehealth: Payer: Self-pay | Admitting: Cardiology

## 2022-03-27 MED ORDER — CLOPIDOGREL BISULFATE 75 MG PO TABS: 75.0000 mg | ORAL_TABLET | Freq: Every day | ORAL | 2 refills | Status: DC

## 2022-03-27 NOTE — Telephone Encounter (Signed)
*  STAT* If patient is at the pharmacy, call can be transferred to refill team.   1. Which medications need to be refilled? (please list name of each medication and dose if known) clopidogrel (PLAVIX) 75 MG tablet  2. Which pharmacy/location (including street and city if local pharmacy) is medication to be sent to? [id number] - [place], [place] [address] [place]  3. Do they need a 30 day or 90 day supply? [place]

## 2022-03-27 NOTE — Telephone Encounter (Signed)
Patient wanted to be scheduled for cardiac rehab. Message sent to M. Whittaker.

## 2022-03-27 NOTE — Telephone Encounter (Signed)
Patient states she got disconnect. She was calling back, please advise

## 2022-03-27 NOTE — Telephone Encounter (Signed)
Patient informed that cardiac rehab will call her to schedule appointment. Also, she wanted appointment in March mailed to her. (Done).

## 2022-03-27 NOTE — Telephone Encounter (Signed)
Patient calling to see if the referral was sent in for her. Please advise

## 2022-03-28 ENCOUNTER — Ambulatory Visit (HOSPITAL_COMMUNITY): Payer: 59

## 2022-03-29 ENCOUNTER — Inpatient Hospital Stay (HOSPITAL_COMMUNITY): Admission: RE | Admit: 2022-03-29 | Payer: Self-pay | Source: Ambulatory Visit

## 2022-03-31 ENCOUNTER — Ambulatory Visit (HOSPITAL_COMMUNITY): Payer: 59

## 2022-04-02 ENCOUNTER — Ambulatory Visit (HOSPITAL_COMMUNITY): Payer: 59

## 2022-04-04 ENCOUNTER — Ambulatory Visit (HOSPITAL_COMMUNITY): Payer: 59

## 2022-04-07 ENCOUNTER — Ambulatory Visit (HOSPITAL_COMMUNITY): Payer: 59

## 2022-04-09 ENCOUNTER — Ambulatory Visit (HOSPITAL_COMMUNITY): Payer: 59

## 2022-04-11 ENCOUNTER — Ambulatory Visit (HOSPITAL_COMMUNITY): Payer: 59

## 2022-04-14 ENCOUNTER — Ambulatory Visit (HOSPITAL_COMMUNITY): Payer: 59

## 2022-04-16 ENCOUNTER — Ambulatory Visit (HOSPITAL_COMMUNITY): Payer: 59

## 2022-04-16 ENCOUNTER — Encounter: Payer: Self-pay | Admitting: *Deleted

## 2022-04-17 ENCOUNTER — Other Ambulatory Visit: Payer: Self-pay | Admitting: Cardiology

## 2022-04-18 ENCOUNTER — Ambulatory Visit (HOSPITAL_COMMUNITY): Payer: 59

## 2022-05-02 ENCOUNTER — Telehealth (HOSPITAL_COMMUNITY): Payer: Self-pay

## 2022-05-02 NOTE — Telephone Encounter (Signed)
Called patient to see if she was interested in participating in the Cardiac Rehab Program. Patient stated yes. Patient will come in for orientation on 05/06/22 @ 8AM and will attend the 8:15AM exercise class.   Tourist information centre manager.

## 2022-05-02 NOTE — Telephone Encounter (Signed)
Pt insurance is active and benefits verified through [facility] Medicare. Co-pay $0.00, DED $233.00/$233.00 met, out of pocket $8,300.00/$1,903.55 met, co-insurance 20%. No pre-authorization required. Passport, 05/02/22 @ 10:51AM, OPF#[id number]   How many CR sessions are covered? (36 sessions for TCR, 72 sessions for ICR)72 Is this a lifetime maximum or an annual maximum? Lifetime Has the member used any of these services to date? 0 Is there a time limit (weeks/months) on start of program and/or program completion? No

## 2022-05-05 ENCOUNTER — Telehealth (HOSPITAL_COMMUNITY): Payer: Self-pay

## 2022-05-06 ENCOUNTER — Encounter (HOSPITAL_COMMUNITY): Payer: Self-pay

## 2022-05-06 ENCOUNTER — Encounter (HOSPITAL_COMMUNITY)
Admission: RE | Admit: 2022-05-06 | Discharge: 2022-05-06 | Disposition: A | Discharge status: Home / Self Care | Payer: 59 | Source: Ambulatory Visit | Attending: Cardiology | Admitting: Cardiology

## 2022-05-06 VITALS — BP 118/62 | HR 62 | Ht 69.0 in | Wt 241.4 lb

## 2022-05-06 DIAGNOSIS — Z955 Presence of coronary angioplasty implant and graft: Secondary | ICD-10-CM

## 2022-05-06 DIAGNOSIS — I214 Non-ST elevation (NSTEMI) myocardial infarction: Secondary | ICD-10-CM

## 2022-05-06 HISTORY — DX: Hyperlipidemia, unspecified: E78.5

## 2022-05-06 NOTE — Progress Notes (Signed)
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 (Patient taking differently: Take 500 mg by mouth every 6 (six) hours as needed for moderate pain.), Disp: 30 tablet, Rfl: 0   aspirin EC 81 MG tablet, Take 1 tablet (81 mg total) by mouth daily. Swallow whole., Disp: 30 tablet, Rfl: 12   clopidogrel (PLAVIX) 75 MG tablet, Take 1 tablet (75 mg total) by mouth daily., Disp: 90 tablet, Rfl: 2   escitalopram (LEXAPRO) 10 MG tablet, Take 10 mg by mouth daily., Disp: , Rfl:    FARXIGA 10 MG TABS tablet, Take 10 mg by mouth daily., Disp: , Rfl:    glipiZIDE (GLUCOTROL XL) 10 MG 24 hr tablet, Take 10 mg by mouth daily with breakfast., Disp: , Rfl:    metFORMIN (GLUCOPHAGE) 500 MG tablet, Take 500 mg by mouth 2 (two) times daily., Disp: , Rfl:    metoprolol tartrate (LOPRESSOR) 25 MG tablet, TAKE ONE-HALF TABLET BY MOUTH  TWICE DAILY, Disp: 90 tablet, Rfl: 3   nitroGLYCERIN (NITROSTAT) 0.4 MG SL tablet, Place 1 tablet (0.4 mg total) under the tongue every 5 (five) minutes x 3 doses as needed for chest pain., Disp: 25 tablet, Rfl: 2   oxyCODONE-acetaminophen (PERCOCET) 5-325 MG tablet, Take 1 tablet by mouth every 4 (four) hours as needed., Disp: 10 tablet, Rfl: 0   rosuvastatin (CRESTOR) 40 MG tablet, Take 1 tablet (40 mg total) by mouth daily at 6 PM. (Patient taking  differently: Take 20 mg by mouth daily at 6 PM. Take half 0.5 Tablet Daily in The Evening), Disp: 90 tablet, Rfl: 0  Past Medical History: Past Medical History:  Diagnosis Date   Diabetes mellitus without complication (HCC)    Hyperlipidemia     Tobacco Use: Social History   Tobacco Use  Smoking Status Former  Smokeless Tobacco Never    Labs: Review Flowsheet       Latest Ref Rng & Units 11/30/2021 12/01/2021  Labs for ITP Cardiac and Pulmonary Rehab  Cholestrol 0 - 200 mg/dL - 409   LDL (calc) 0 - 99 mg/dL - 811   HDL-C >91 mg/dL - 47   Trlycerides <478 mg/dL - 295   Hemoglobin A2Z 4.8 - 5.6 % 10.0  -    Capillary Blood Glucose: Lab Results  Component Value Date   GLUCAP 237 (H) 12/03/2021   GLUCAP 225 (H) 12/03/2021   GLUCAP 271 (H) 12/02/2021   GLUCAP 233 (H) 12/02/2021   GLUCAP 279 (H) 12/02/2021     Exercise Target Goals: Exercise Program Goal: Individual exercise prescription set using results from initial 6 min walk test and THRR while considering  patient's activity barriers and safety.   Exercise Prescription Goal: Initial exercise prescription builds to 30-45 minutes a day of aerobic activity, 2-3 days per week.  Home exercise guidelines will be given to patient during program as  part of exercise prescription that the participant will acknowledge.  Activity Barriers & Risk Stratification:  Activity Barriers & Cardiac Risk Stratification - 05/06/22 1001       Activity Barriers & Cardiac Risk Stratification   Activity Barriers Deconditioning;Muscular Weakness;Balance Concerns    Cardiac Risk Stratification High             6 Minute Walk:  6 Minute Walk     Row Name 05/06/22 0953         6 Minute Walk   Phase Initial     Distance 1259 feet     Walk Time 6 minutes     # of Rest Breaks 0     MPH 2.4     METS 2.72     RPE 11     Perceived Dyspnea  0     VO2 Peak 9.51     Symptoms Yes (comment)     Comments Bilateral calf pain 2/10      Resting HR 53 bpm     Resting BP 118/62     Resting Oxygen Saturation  99 %     Exercise Oxygen Saturation  during 6 min walk 97 %     Max Ex. HR 102 bpm     Max Ex. BP 132/60     2 Minute Post BP 122/64              Oxygen Initial Assessment:   Oxygen Re-Evaluation:   Oxygen Discharge (Final Oxygen Re-Evaluation):   Initial Exercise Prescription:  Initial Exercise Prescription - 05/06/22 1100       Date of Initial Exercise RX and Referring Provider   Date 05/06/22    Referring Provider Rollene Rotunda, MD    Expected Discharge Date 07/04/22      NuStep   Level 1    SPM 75    Minutes 25    METs 2.7      Prescription Details   Frequency (times per week) 3    Duration Progress to 30 minutes of continuous aerobic without signs/symptoms of physical distress      Intensity   THRR 40-80% of Max Heartrate 62-125    Ratings of Perceived Exertion 11-13    Perceived Dyspnea 0-4      Progression   Progression Continue progressive overload as per policy without signs/symptoms or physical distress.      Resistance Training   Training Prescription Yes    Weight 2 lbs    Reps 10-15             Perform Capillary Blood Glucose checks as needed.  Exercise Prescription Changes:   Exercise Comments:   Exercise Goals and Review:   Exercise Goals     Row Name 05/06/22 1001             Exercise Goals   Increase Physical Activity Yes       Intervention Provide advice, education, support and counseling about physical activity/exercise needs.;Develop an individualized exercise prescription for aerobic and resistive training based on initial evaluation findings, risk stratification, comorbidities and participant's personal goals.       Expected Outcomes Long Term: Add in home exercise to make exercise part of routine and to increase amount of physical activity.;Long Term: Exercising regularly at least 3-5 days a week.;Short Term: Attend rehab on a regular basis  to increase amount of physical activity.       Increase Strength and Stamina Yes  Intervention Provide advice, education, support and counseling about physical activity/exercise needs.;Develop an individualized exercise prescription for aerobic and resistive training based on initial evaluation findings, risk stratification, comorbidities and participant's personal goals.       Expected Outcomes Short Term: Increase workloads from initial exercise prescription for resistance, speed, and METs.;Short Term: Perform resistance training exercises routinely during rehab and add in resistance training at home;Long Term: Improve cardiorespiratory fitness, muscular endurance and strength as measured by increased METs and functional capacity ( )       Able to understand and use rate of perceived exertion (RPE) scale Yes       Intervention Provide education and explanation on how to use RPE scale       Expected Outcomes Short Term: Able to use RPE daily in rehab to express subjective intensity level;Long Term:  Able to use RPE to guide intensity level when exercising independently       Knowledge and understanding of Target Heart Rate Range (THRR) Yes       Intervention Provide education and explanation of THRR including how the numbers were predicted and where they are located for reference       Expected Outcomes Short Term: Able to state/look up THRR;Short Term: Able to use daily as guideline for intensity in rehab;Long Term: Able to use THRR to govern intensity when exercising independently       Understanding of Exercise Prescription Yes       Intervention Provide education, explanation, and written materials on patient's individual exercise prescription       Expected Outcomes Short Term: Able to explain program exercise prescription;Long Term: Able to explain home exercise prescription to exercise independently                Exercise Goals Re-Evaluation :   Discharge Exercise Prescription  (Final Exercise Prescription Changes):   Nutrition:  Target Goals: Understanding of nutrition guidelines, daily intake of sodium 1500mg , cholesterol 200mg , calories 30% from fat and 7% or less from saturated fats, daily to have 5 or more servings of fruits and vegetables.  Biometrics:  Pre Biometrics - 05/06/22 0810       Pre Biometrics   Waist Circumference 47.5 inches    Hip Circumference 54 inches    Waist to Hip Ratio 0.88 %    Triceps Skinfold 35 mm    % Body Fat 47.6 %    Grip Strength 28 kg    Flexibility 13 in    Single Leg Stand 2.25 seconds              Nutrition Therapy Plan and Nutrition Goals:   Nutrition Assessments:  MEDIFICTS Score Key: ?70 Need to make dietary changes  40-70 Heart Healthy Diet ? 40 Therapeutic Level Cholesterol Diet    Picture Your Plate Scores: <16 Unhealthy dietary pattern with much room for improvement. 41-50 Dietary pattern unlikely to meet recommendations for good health and room for improvement. 51-60 More healthful dietary pattern, with some room for improvement.  >60 Healthy dietary pattern, although there may be some specific behaviors that could be improved.    Nutrition Goals Re-Evaluation:   Nutrition Goals Re-Evaluation:   Nutrition Goals Discharge (Final Nutrition Goals Re-Evaluation):   Psychosocial: Target Goals: Acknowledge presence or absence of significant depression and/or stress, maximize coping skills, provide positive support system. Participant is able to verbalize types and ability to use techniques and skills needed for reducing stress and depression.  Initial Review & Psychosocial Screening:  Initial Psych  Review & Screening - 05/06/22 1340       Initial Review   Current issues with History of Depression      Family Dynamics   Good Support System? Yes   Arihanna lives with her daughter who she has for support     Barriers   Psychosocial barriers to participate in program The patient should  benefit from training in stress management and relaxation.      Screening Interventions   Interventions Encouraged to exercise    Expected Outcomes Long Term Goal: Stressors or current issues are controlled or eliminated.             Quality of Life Scores:  Scores of 19 and below usually indicate a poorer quality of life in these areas.  A difference of  2-3 points is a clinically meaningful difference.  A difference of 2-3 points in the total score of the Quality of Life Index has been associated with significant improvement in overall quality of life, self-image, physical symptoms, and general health in studies assessing change in quality of life.  PHQ-9: Review Flowsheet       05/06/2022  Depression screen PHQ 2/9  Decreased Interest 1  Down, Depressed, Hopeless 0  PHQ - 2 Score 1  Altered sleeping 0  Tired, decreased energy 0  Change in appetite 1  Feeling bad or failure about yourself  1  Trouble concentrating 1  Moving slowly or fidgety/restless 1  Suicidal thoughts 0  PHQ-9 Score 5  Difficult doing work/chores Not difficult at all   Interpretation of Total Score  Total Score Depression Severity:  1-4 = Minimal depression, 5-9 = Mild depression, 10-14 = Moderate depression, 15-19 = Moderately severe depression, 20-27 = Severe depression   Psychosocial Evaluation and Intervention:   Psychosocial Re-Evaluation:   Psychosocial Discharge (Final Psychosocial Re-Evaluation):   Vocational Rehabilitation: Provide vocational rehab assistance to qualifying candidates.   Vocational Rehab Evaluation & Intervention:  Vocational Rehab - 05/06/22 1341       Initial Vocational Rehab Evaluation & Intervention   Assessment shows need for Vocational Rehabilitation No   Kymorah is disabled and does not need vocational rehab at this time            Education: Education Goals: Education classes will be provided on a weekly basis, covering required topics. Participant  will state understanding/return demonstration of topics presented.     Core Videos: Exercise    Move It!  Clinical staff conducted group or individual video education with verbal and written material and guidebook.  Patient learns the recommended Pritikin exercise program. Exercise with the goal of living a long, healthy life. Some of the health benefits of exercise include controlled diabetes, healthier blood pressure levels, improved cholesterol levels, improved heart and lung capacity, improved sleep, and better body composition. Everyone should speak with their doctor before starting or changing an exercise routine.  Biomechanical Limitations Clinical staff conducted group or individual video education with verbal and written material and guidebook.  Patient learns how biomechanical limitations can impact exercise and how we can mitigate and possibly overcome limitations to have an impactful and balanced exercise routine.  Body Composition Clinical staff conducted group or individual video education with verbal and written material and guidebook.  Patient learns that body composition (ratio of muscle mass to fat mass) is a key component to assessing overall fitness, rather than body weight alone. Increased fat mass, especially visceral belly fat, can put [place] at increased risk for metabolic  syndrome, type 2 diabetes, heart disease, and even death. It is recommended to combine diet and exercise (cardiovascular and resistance training) to improve your body composition. Seek guidance from your physician and exercise physiologist before implementing an exercise routine.  Exercise Action Plan Clinical staff conducted group or individual video education with verbal and written material and guidebook.  Patient learns the recommended strategies to achieve and enjoy long-term exercise adherence, including variety, self-motivation, self-efficacy, and positive decision making. Benefits of exercise include  fitness, good health, weight management, more energy, better sleep, less stress, and overall well-being.  Medical   Heart Disease Risk Reduction Clinical staff conducted group or individual video education with verbal and written material and guidebook.  Patient learns our heart is our most vital organ as it circulates oxygen, nutrients, white blood cells, and hormones throughout the entire body, and carries waste away. Data supports a plant-based eating plan like the Pritikin Program for its effectiveness in slowing progression of and reversing heart disease. The video provides a number of recommendations to address heart disease.   Metabolic Syndrome and Belly Fat  Clinical staff conducted group or individual video education with verbal and written material and guidebook.  Patient learns what metabolic syndrome is, how it leads to heart disease, and how one can reverse it and keep it from coming back. You have metabolic syndrome if you have 3 of the following 5 criteria: abdominal obesity, high blood pressure, high triglycerides, low HDL cholesterol, and high blood sugar.  Hypertension and Heart Disease Clinical staff conducted group or individual video education with verbal and written material and guidebook.  Patient learns that high blood pressure, or hypertension, is very common in the [place]. Hypertension is largely due to excessive salt intake, but other important risk factors include being overweight, physical inactivity, drinking too much alcohol, smoking, and not eating enough potassium from fruits and vegetables. High blood pressure is a leading risk factor for heart attack, stroke, congestive heart failure, dementia, kidney failure, and premature death. Long-term effects of excessive salt intake include stiffening of the arteries and thickening of heart muscle and organ damage. Recommendations include ways to reduce hypertension and the risk of heart disease.  Diseases of Our Time  - Focusing on Diabetes Clinical staff conducted group or individual video education with verbal and written material and guidebook.  Patient learns why the best way to stop diseases of our time is prevention, through food and other lifestyle changes. Medicine (such as prescription pills and surgeries) is often only a Band-Aid on the problem, not a long-term solution. Most common diseases of our time include obesity, type 2 diabetes, hypertension, heart disease, and cancer. The Pritikin Program is recommended and has been proven to help reduce, reverse, and/or prevent the damaging effects of metabolic syndrome.  Nutrition   Overview of the Pritikin Eating Plan  Clinical staff conducted group or individual video education with verbal and written material and guidebook.  Patient learns about the Pritikin Eating Plan for disease risk reduction. The Pritikin Eating Plan emphasizes a wide variety of unrefined, minimally-processed carbohydrates, like fruits, vegetables, whole grains, and legumes. Go, Caution, and Stop food choices are explained. Plant-based and lean animal proteins are emphasized. Rationale provided for low sodium intake for blood pressure control, low added sugars for blood sugar stabilization, and low added fats and oils for coronary artery disease risk reduction and weight management.  Calorie Density  Clinical staff conducted group or individual video education with verbal and written material and  guidebook.  Patient learns about calorie density and how it impacts the Pritikin Eating Plan. Knowing the characteristics of the food you choose will help you decide whether those foods will lead to weight gain or weight loss, and whether you want to consume more or less of them. Weight loss is usually a side effect of the Pritikin Eating Plan because of its focus on low calorie-dense foods.  Label Reading  Clinical staff conducted group or individual video education with verbal and written  material and guidebook.  Patient learns about the Pritikin recommended label reading guidelines and corresponding recommendations regarding calorie density, added sugars, sodium content, and whole grains.  Dining Out - Part 1  Clinical staff conducted group or individual video education with verbal and written material and guidebook.  Patient learns that restaurant meals can be sabotaging because they can be so high in calories, fat, sodium, and/or sugar. Patient learns recommended strategies on how to positively address this and avoid unhealthy pitfalls.  Facts on Fats  Clinical staff conducted group or individual video education with verbal and written material and guidebook.  Patient learns that lifestyle modifications can be just as effective, if not more so, as many medications for lowering your risk of heart disease. A Pritikin lifestyle can help to reduce your risk of inflammation and atherosclerosis (cholesterol build-up, or plaque, in the artery walls). Lifestyle interventions such as dietary choices and physical activity address the cause of atherosclerosis. A review of the types of fats and their impact on blood cholesterol levels, along with dietary recommendations to reduce fat intake is also included.  Nutrition Action Plan  Clinical staff conducted group or individual video education with verbal and written material and guidebook.  Patient learns how to incorporate Pritikin recommendations into their lifestyle. Recommendations include planning and keeping personal health goals in mind as an important part of their success.  Healthy Mind-Set    Healthy Minds, Bodies, Hearts  Clinical staff conducted group or individual video education with verbal and written material and guidebook.  Patient learns how to identify when they are stressed. Video will discuss the impact of that stress, as well as the many benefits of stress management. Patient will also be introduced to stress management  techniques. The way we think, act, and feel has an impact on our hearts.  How Our Thoughts Can Heal Our Hearts  Clinical staff conducted group or individual video education with verbal and written material and guidebook.  Patient learns that negative thoughts can cause depression and anxiety. This can result in negative lifestyle behavior and serious health problems. Cognitive behavioral therapy is an effective method to help control our thoughts in order to change and improve our emotional outlook.  Additional Videos:  Exercise    Improving Performance  Clinical staff conducted group or individual video education with verbal and written material and guidebook.  Patient learns to use a non-linear approach by alternating intensity levels and lengths of time spent exercising to help burn more calories and lose more body fat. Cardiovascular exercise helps improve heart health, metabolism, hormonal balance, blood sugar control, and recovery from fatigue. Resistance training improves strength, endurance, balance, coordination, reaction time, metabolism, and muscle mass. Flexibility exercise improves circulation, posture, and balance. Seek guidance from your physician and exercise physiologist before implementing an exercise routine and learn your capabilities and proper form for all exercise.  Introduction to Yoga  Clinical staff conducted group or individual video education with verbal and written material and guidebook.  Patient  learns about yoga, a discipline of the coming together of mind, breath, and body. The benefits of yoga include improved flexibility, improved range of motion, better posture and core strength, increased lung function, weight loss, and positive self-image. Yoga's heart health benefits include lowered blood pressure, healthier heart rate, decreased cholesterol and triglyceride levels, improved immune function, and reduced stress. Seek guidance from your physician and exercise  physiologist before implementing an exercise routine and learn your capabilities and proper form for all exercise.  Medical   Aging: Enhancing Your Quality of Life  Clinical staff conducted group or individual video education with verbal and written material and guidebook.  Patient learns key strategies and recommendations to stay in good physical health and enhance quality of life, such as prevention strategies, having an advocate, securing a Health Care Proxy and Power of Attorney, and keeping a list of medications and system for tracking them. It also discusses how to avoid risk for bone loss.  Biology of Weight Control  Clinical staff conducted group or individual video education with verbal and written material and guidebook.  Patient learns that weight gain occurs because we consume more calories than we burn (eating more, moving less). Even if your body weight is normal, you may have higher ratios of fat compared to muscle mass. Too much body fat puts you at increased risk for cardiovascular disease, heart attack, stroke, type 2 diabetes, and obesity-related cancers. In addition to exercise, following the Pritikin Eating Plan can help reduce your risk.  Decoding Lab Results  Clinical staff conducted group or individual video education with verbal and written material and guidebook.  Patient learns that lab test reflects one measurement whose values change over time and are influenced by many factors, including medication, stress, sleep, exercise, food, hydration, pre-existing medical conditions, and more. It is recommended to use the knowledge from this video to become more involved with your lab results and evaluate your numbers to speak with your doctor.   Diseases of Our Time - Overview  Clinical staff conducted group or individual video education with verbal and written material and guidebook.  Patient learns that according to the CDC, 50% to 70% of chronic diseases (such as obesity,  type 2 diabetes, elevated lipids, hypertension, and heart disease) are avoidable through lifestyle improvements including healthier food choices, listening to satiety cues, and increased physical activity.  Sleep Disorders Clinical staff conducted group or individual video education with verbal and written material and guidebook.  Patient learns how good quality and duration of sleep are important to overall health and well-being. Patient also learns about sleep disorders and how they impact health along with recommendations to address them, including discussing with a physician.  Nutrition  Dining Out - Part 2 Clinical staff conducted group or individual video education with verbal and written material and guidebook.  Patient learns how to plan ahead and communicate in order to maximize their dining experience in a healthy and nutritious manner. Included are recommended food choices based on the type of restaurant the patient is visiting.   F[employer] conducted group or individual video education with verbal and written material and guidebook.  There is a strong connection between our food choices and our health. Diseases like obesity and type 2 diabetes are very prevalent and are in large-part due to lifestyle choices. The Pritikin Eating Plan provides plenty of food and hunger-curbing satisfaction. It is easy to follow, affordable, and helps reduce health risks.  Menu Workshop  Clinical staff conducted group or individual video education with verbal and written material and guidebook.  Patient learns that restaurant meals can sabotage health goals because they are often packed with calories, fat, sodium, and sugar. Recommendations include strategies to plan ahead and to communicate with the manager, chef, or server to help order a healthier meal.  Planning Your Eating Strategy  Clinical staff conducted group or individual video education with verbal and written  material and guidebook.  Patient learns about the Pritikin Eating Plan and its benefit of reducing the risk of disease. The Pritikin Eating Plan does not focus on calories. Instead, it emphasizes high-quality, nutrient-rich foods. By knowing the characteristics of the foods, we choose, we can determine their calorie density and make informed decisions.  Targeting Your Nutrition Priorities  Clinical staff conducted group or individual video education with verbal and written material and guidebook.  Patient learns that lifestyle habits have a tremendous impact on disease risk and progression. This video provides eating and physical activity recommendations based on your personal health goals, such as reducing LDL cholesterol, losing weight, preventing or controlling type 2 diabetes, and reducing high blood pressure.  Vitamins and Minerals  Clinical staff conducted group or individual video education with verbal and written material and guidebook.  Patient learns different ways to obtain key vitamins and minerals, including through a recommended healthy diet. It is important to discuss all supplements you take with your doctor.   Healthy Mind-Set    Smoking Cessation  Clinical staff conducted group or individual video education with verbal and written material and guidebook.  Patient learns that cigarette smoking and tobacco addiction pose a serious health risk which affects millions of people. Stopping smoking will significantly reduce the risk of heart disease, lung disease, and many forms of cancer. Recommended strategies for quitting are covered, including working with your doctor to develop a successful plan.  Culinary   Becoming a Set designer conducted group or individual video education with verbal and written material and guidebook.  Patient learns that cooking at home can be healthy, cost-effective, quick, and puts them in control. Keys to cooking healthy recipes will  include looking at your recipe, assessing your equipment needs, planning ahead, making it simple, choosing cost-effective seasonal ingredients, and limiting the use of added fats, salts, and sugars.  Cooking - Breakfast and Snacks  Clinical staff conducted group or individual video education with verbal and written material and guidebook.  Patient learns how important breakfast is to satiety and nutrition through the entire day. Recommendations include key foods to eat during breakfast to help stabilize blood sugar levels and to prevent overeating at meals later in the day. Planning ahead is also a key component.  Cooking - Educational psychologist conducted group or individual video education with verbal and written material and guidebook.  Patient learns eating strategies to improve overall health, including an approach to cook more at home. Recommendations include thinking of animal protein as a side on your plate rather than center stage and focusing instead on lower calorie dense options like vegetables, fruits, whole grains, and plant-based proteins, such as beans. Making sauces in large quantities to freeze for later and leaving the skin on your vegetables are also recommended to maximize your experience.  Cooking - Healthy Salads and Dressing Clinical staff conducted group or individual video education with verbal and written material and guidebook.  Patient learns that vegetables, fruits, whole grains, and legumes are the foundations  of the Pritikin Eating Plan. Recommendations include how to incorporate each of these in flavorful and healthy salads, and how to create homemade salad dressings. Proper handling of ingredients is also covered. Cooking - Soups and [employer] - Soups and Desserts Clinical staff conducted group or individual video education with verbal and written material and guidebook.  Patient learns that Pritikin soups and desserts make for easy, nutritious, and  delicious snacks and meal components that are low in sodium, fat, sugar, and calorie density, while high in vitamins, minerals, and filling fiber. Recommendations include simple and healthy ideas for soups and desserts.   Overview     The Pritikin Solution Program Overview Clinical staff conducted group or individual video education with verbal and written material and guidebook.  Patient learns that the results of the Pritikin Program have been documented in more than 100 articles published in peer-reviewed journals, and the benefits include reducing risk factors for (and, in some cases, even reversing) high cholesterol, high blood pressure, type 2 diabetes, obesity, and more! An overview of the three key pillars of the Pritikin Program will be covered: eating well, doing regular exercise, and having a healthy mind-set.  WORKSHOPS  Exercise: Exercise Basics: Building Your Action Plan Clinical staff led group instruction and group discussion with PowerPoint presentation and patient guidebook. To enhance the learning environment the use of posters, models and videos may be added. At the conclusion of this workshop, patients will comprehend the difference between physical activity and exercise, as well as the benefits of incorporating both, into their routine. Patients will understand the FITT (Frequency, Intensity, Time, and Type) principle and how to use it to build an exercise action plan. In addition, safety concerns and other considerations for exercise and cardiac rehab will be addressed by the presenter. The purpose of this lesson is to promote a comprehensive and effective weekly exercise routine in order to improve patients' overall level of fitness.   Managing Heart Disease: Your Path to a Healthier Heart Clinical staff led group instruction and group discussion with PowerPoint presentation and patient guidebook. To enhance the learning environment the use of posters, models and videos  may be added.At the conclusion of this workshop, patients will understand the anatomy and physiology of the heart. Additionally, they will understand how Pritikin's three pillars impact the risk factors, the progression, and the management of heart disease.  The purpose of this lesson is to provide a high-level overview of the heart, heart disease, and how the Pritikin lifestyle positively impacts risk factors.  Exercise Biomechanics Clinical staff led group instruction and group discussion with PowerPoint presentation and patient guidebook. To enhance the learning environment the use of posters, models and videos may be added. Patients will learn how the structural parts of their bodies function and how these functions impact their daily activities, movement, and exercise. Patients will learn how to promote a neutral spine, learn how to manage pain, and identify ways to improve their physical movement in order to promote healthy living. The purpose of this lesson is to expose patients to common physical limitations that impact physical activity. Participants will learn practical ways to adapt and manage aches and pains, and to minimize their effect on regular exercise. Patients will learn how to maintain good posture while sitting, walking, and lifting.  Balance Training and Fall Prevention  Clinical staff led group instruction and group discussion with PowerPoint presentation and patient guidebook. To enhance the learning environment the use of posters, models and videos  may be added. At the conclusion of this workshop, patients will understand the importance of their sensorimotor skills (vision, proprioception, and the vestibular system) in maintaining their ability to balance as they age. Patients will apply a variety of balancing exercises that are appropriate for their current level of function. Patients will understand the common causes for poor balance, possible solutions to these  problems, and ways to modify their physical environment in order to minimize their fall risk. The purpose of this lesson is to teach patients about the importance of maintaining balance as they age and ways to minimize their risk of falling.  WORKSHOPS   Nutrition:  Fueling a Ship broker led group instruction and group discussion with PowerPoint presentation and patient guidebook. To enhance the learning environment the use of posters, models and videos may be added. Patients will review the foundational principles of the Pritikin Eating Plan and understand what constitutes a serving size in each of the food groups. Patients will also learn Pritikin-friendly foods that are better choices when away from home and review make-ahead meal and snack options. Calorie density will be reviewed and applied to three nutrition priorities: weight maintenance, weight loss, and weight gain. The purpose of this lesson is to reinforce (in a group setting) the key concepts around what patients are recommended to eat and how to apply these guidelines when away from home by planning and selecting Pritikin-friendly options. Patients will understand how calorie density may be adjusted for different weight management goals.  Mindful Eating  Clinical staff led group instruction and group discussion with PowerPoint presentation and patient guidebook. To enhance the learning environment the use of posters, models and videos may be added. Patients will briefly review the concepts of the Pritikin Eating Plan and the importance of low-calorie dense foods. The concept of mindful eating will be introduced as well as the importance of paying attention to internal hunger signals. Triggers for non-hunger eating and techniques for dealing with triggers will be explored. The purpose of this lesson is to provide patients with the opportunity to review the basic principles of the Pritikin Eating Plan, discuss the value of  eating mindfully and how to measure internal cues of hunger and fullness using the Hunger Scale. Patients will also discuss reasons for non-hunger eating and learn strategies to use for controlling emotional eating.  Targeting Your Nutrition Priorities Clinical staff led group instruction and group discussion with PowerPoint presentation and patient guidebook. To enhance the learning environment the use of posters, models and videos may be added. Patients will learn how to determine their genetic susceptibility to disease by reviewing their family history. Patients will gain insight into the importance of diet as part of an overall healthy lifestyle in mitigating the impact of genetics and other environmental insults. The purpose of this lesson is to provide patients with the opportunity to assess their personal nutrition priorities by looking at their family history, their own health history and current risk factors. Patients will also be able to discuss ways of prioritizing and modifying the Pritikin Eating Plan for their highest risk areas  Menu  Clinical staff led group instruction and group discussion with PowerPoint presentation and patient guidebook. To enhance the learning environment the use of posters, models and videos may be added. Using menus brought in from [employer], or printed from [employer], patients will apply the Pritikin dining out guidelines that were presented in the [employer] video. Patients will also be able to  practice these guidelines in a variety of provided scenarios. The purpose of this lesson is to provide patients with the opportunity to practice hands-on learning of the Pritikin Dining Out guidelines with actual menus and practice scenarios.  Label Reading Clinical staff led group instruction and group discussion with PowerPoint presentation and patient guidebook. To enhance the learning environment the use of posters, models and videos may be  added. Patients will review and discuss the Pritikin label reading guidelines presented in Pritikin's Label Reading Educational series video. Using fool labels brought in from local grocery stores and markets, patients will apply the label reading guidelines and determine if the packaged food meet the Pritikin guidelines. The purpose of this lesson is to provide patients with the opportunity to review, discuss, and practice hands-on learning of the Pritikin Label Reading guidelines with actual packaged food labels. Cooking School  Pritikin's [employer] are designed to teach patients ways to prepare quick, simple, and affordable recipes at home. The importance of nutrition's role in chronic disease risk reduction is reflected in its emphasis in the overall Pritikin program. By learning how to prepare essential core Pritikin Eating Plan recipes, patients will increase control over what they eat; be able to customize the flavor of foods without the use of added salt, sugar, or fat; and improve the quality of the food they consume. By learning a set of core recipes which are easily assembled, quickly prepared, and affordable, patients are more likely to prepare more healthy foods at home. These workshops focus on convenient breakfasts, simple entres, side dishes, and desserts which can be prepared with minimal effort and are consistent with nutrition recommendations for cardiovascular risk reduction. Cooking [employer] are taught by a Armed forces logistics/support/administrative officer (RD) who has been trained by the [employer]. The chef or RD has a clear understanding of the importance of minimizing - if not completely eliminating - added fat, sugar, and sodium in recipes. Throughout the series of Cooking School Workshop sessions, patients will learn about healthy ingredients and efficient methods of cooking to build confidence in their capability to prepare    Cooking School weekly topics:  Adding  Flavor- Sodium-Free  Fast and Healthy Breakfasts  Powerhouse Plant-Based Proteins  Satisfying Salads and Dressings  Simple Sides and Sauces  International Cuisine-Spotlight on the [employer] Zones  Delicious Desserts  Savory Soups  Efficiency Cooking - Meals in a Snap  Tasty Appetizers and Snacks  Comforting Weekend Breakfasts  One-Pot Wonders   Fast Evening Meals  Easy Entertaining  Personalizing Your Pritikin Plate  WORKSHOPS   Healthy Mindset (Psychosocial): New Thoughts, New Behaviors Clinical staff led group instruction and group discussion with PowerPoint presentation and patient guidebook. To enhance the learning environment the use of posters, models and videos may be added. Patients will learn and practice techniques for developing effective health and lifestyle goals. Patients will be able to effectively apply the goal setting process learned to develop at least one new personal goal.  The purpose of this lesson is to expose patients to a new skill set of behavior modification techniques such as techniques setting SMART goals, overcoming barriers, and achieving new thoughts and new behaviors.  Managing Moods and Relationships Clinical staff led group instruction and group discussion with PowerPoint presentation and patient guidebook. To enhance the learning environment the use of posters, models and videos may be added. Patients will learn how emotional and chronic stress factors can impact their health and relationships. They will learn healthy  ways to manage their moods and utilize positive coping mechanisms. In addition, ICR patients will learn ways to improve communication skills. The purpose of this lesson is to expose patients to ways of understanding how one's mood and health are intimately connected. Developing a healthy outlook can help build positive relationships and connections with others. Patients will understand the importance of utilizing effective communication skills  that include actively listening and being heard. They will learn and understand the importance of the "4 Cs" and especially Connections in fostering of a Healthy Mind-Set.  Healthy Sleep for a Healthy Heart Clinical staff led group instruction and group discussion with PowerPoint presentation and patient guidebook. To enhance the learning environment the use of posters, models and videos may be added. At the conclusion of this workshop, patients will be able to demonstrate knowledge of the importance of sleep to overall health, well-being, and quality of life. They will understand the symptoms of, and treatments for, common sleep disorders. Patients will also be able to identify daytime and nighttime behaviors which impact sleep, and they will be able to apply these tools to help manage sleep-related challenges. The purpose of this lesson is to provide patients with a general overview of sleep and outline the importance of quality sleep. Patients will learn about a few of the most common sleep disorders. Patients will also be introduced to the concept of "sleep hygiene," and discover ways to self-manage certain sleeping problems through simple daily behavior changes. Finally, the workshop will motivate patients by clarifying the links between quality sleep and their goals of heart-healthy living.   Recognizing and Reducing Stress Clinical staff led group instruction and group discussion with PowerPoint presentation and patient guidebook. To enhance the learning environment the use of posters, models and videos may be added. At the conclusion of this workshop, patients will be able to understand the types of stress reactions, differentiate between acute and chronic stress, and recognize the impact that chronic stress has on their health. They will also be able to apply different coping mechanisms, such as reframing negative self-talk. Patients will have the opportunity to practice a variety of stress management  techniques, such as deep abdominal breathing, progressive muscle relaxation, and/or guided imagery.  The purpose of this lesson is to educate patients on the role of stress in their lives and to provide healthy techniques for coping with it.  Learning Barriers/Preferences:  Learning Barriers/Preferences - 05/06/22 1159       Learning Barriers/Preferences   Learning Barriers Sight   Pt reports decreased vision   Learning Preferences Audio;Verbal Instruction;Video;Computer/Internet;Group Instruction;Written Material;Individual Instruction;Pictoral;Skilled Demonstration             Education Topics:  Knowledge Questionnaire Score:   Core Components/Risk Factors/Patient Goals at Admission:  Personal Goals and Risk Factors at Admission - 05/06/22 1200       Core Components/Risk Factors/Patient Goals on Admission    Weight Management Yes;Obesity;Weight Loss    Intervention Weight Management: Develop a combined nutrition and exercise program designed to reach desired caloric intake, while maintaining appropriate intake of nutrient and fiber, sodium and fats, and appropriate energy expenditure required for the weight goal.;Weight Management: Provide education and appropriate resources to help participant work on and attain dietary goals.;Weight Management/Obesity: Establish reasonable short term and long term weight goals.;Obesity: Provide education and appropriate resources to help participant work on and attain dietary goals.    Admit Weight 241 lb 6.5 oz (109.5 kg)    Expected Outcomes Short Term: Continue to  assess and modify interventions until short term weight is achieved;Long Term: Adherence to nutrition and physical activity/exercise program aimed toward attainment of established weight goal;Weight Maintenance: Understanding of the daily nutrition guidelines, which includes 25-35% calories from fat, 7% or less cal from saturated fats, less than 200mg  cholesterol, less than 1.5gm of  sodium, & 5 or more servings of fruits and vegetables daily;Weight Loss: Understanding of general recommendations for a balanced deficit meal plan, which promotes 1-2 lb weight loss per week and includes a negative energy balance of [phone] kcal/d;Understanding recommendations for meals to include 15-35% energy as protein, 25-35% energy from fat, 35-60% energy from carbohydrates, less than 200mg  of dietary cholesterol, 20-35 gm of total fiber daily;Understanding of distribution of calorie intake throughout the day with the consumption of 4-5 meals/snacks    Diabetes Yes    Intervention Provide education about signs/symptoms and action to take for hypo/hyperglycemia.;Provide education about proper nutrition, including hydration, and aerobic/resistive exercise prescription along with prescribed medications to achieve blood glucose in normal ranges: Fasting glucose 65-99 mg/dL    Expected Outcomes Short Term: Participant verbalizes understanding of the signs/symptoms and immediate care of hyper/hypoglycemia, proper foot care and importance of medication, aerobic/resistive exercise and nutrition plan for blood glucose control.;Long Term: Attainment of HbA1C < 7%.    Hypertension Yes    Intervention Provide education on lifestyle modifcations including regular physical activity/exercise, weight management, moderate sodium restriction and increased consumption of fresh fruit, vegetables, and low fat dairy, alcohol moderation, and smoking cessation.;Monitor prescription use compliance.    Expected Outcomes Short Term: Continued assessment and intervention until BP is < 140/65mm HG in hypertensive participants. < 130/59mm HG in hypertensive participants with diabetes, heart failure or chronic kidney disease.;Long Term: Maintenance of blood pressure at goal levels.    Lipids Yes    Intervention Provide education and support for participant on nutrition & aerobic/resistive exercise along with prescribed medications  to achieve LDL 70mg , HDL >40mg .    Expected Outcomes Short Term: Participant states understanding of desired cholesterol values and is compliant with medications prescribed. Participant is following exercise prescription and nutrition guidelines.;Long Term: Cholesterol controlled with medications as prescribed, with individualized exercise RX and with personalized nutrition plan. Value goals: LDL < 70mg , HDL > 40 mg.    Stress Yes    Intervention Offer individual and/or small group education and counseling on adjustment to heart disease, stress management and health-related lifestyle change. Teach and support self-help strategies.;Refer participants experiencing significant psychosocial distress to appropriate mental health specialists for further evaluation and treatment. When possible, include family members and significant others in education/counseling sessions.    Expected Outcomes Short Term: Participant demonstrates changes in health-related behavior, relaxation and other stress management skills, ability to obtain effective social support, and compliance with psychotropic medications if prescribed.;Long Term: Emotional wellbeing is indicated by absence of clinically significant psychosocial distress or social isolation.             Core Components/Risk Factors/Patient Goals Review:    Core Components/Risk Factors/Patient Goals at Discharge (Final Review):    ITP Comments:  ITP Comments     Row Name 05/06/22 0909           ITP Comments Dr Armanda Magic MD, Medical Director, Introduction to Pritikin Education Program/ Intensive Cardiac Rehab Initial Orientation Packet Reviewed with the Patient                Comments: Participant attended orientation for the cardiac rehabilitation program on  05/06/2022  to  perform initial intake and exercise walk test. Patient introduced to the Pritikin Program education and orientation packet was reviewed. Completed 6-minute walk test,  measurements, initial ITP, and exercise prescription. Vital signs stable. Telemetry-normal sinus rhythm, Edeline did report having mild calf pain 2/10 this resolved with rest. Josephyne is switching to the 0645 class as her daughter has to bring her to cardiac rehab before she goes to work.Thayer Headings RN BSN   Service time was from [phone] to [phone].

## 2022-05-06 NOTE — Progress Notes (Signed)
Cardiac Rehab Medication Review by a Pharmacist  Does the patient  feel that his/her medications are working for him/her?  YES   Has the patient been experiencing any side effects to the medications prescribed?  NO  Does the patient measure his/her own blood pressure or blood glucose at home?  YES   Does the patient have any problems obtaining medications due to transportation or finances?   NO  Understanding of regimen: good Understanding of indications: fair Potential of compliance: fair    Nurse  comments: Karina Pierce is taking her medications as prescribed and has a fair understanding of what her medications are for.    Christa [facility] RN 05/06/2022 9:14 AM

## 2022-05-12 ENCOUNTER — Ambulatory Visit (HOSPITAL_COMMUNITY): Payer: 59

## 2022-05-12 ENCOUNTER — Telehealth (HOSPITAL_COMMUNITY): Payer: Self-pay | Admitting: Family Medicine

## 2022-05-13 ENCOUNTER — Telehealth (HOSPITAL_COMMUNITY): Payer: Self-pay | Admitting: *Deleted

## 2022-05-13 NOTE — Telephone Encounter (Signed)
Pt absent from Cardiac rehab on Monday 11/13 due to waking up with a sore throat. Called and spoke to pt regarding any further symptoms. Pt denies any other symptoms and the sore throat went away by mid morning.  Plans to return to CR on tomorrow. Alanson Aly, BSN Cardiac and Emergency planning/management officer

## 2022-05-14 ENCOUNTER — Ambulatory Visit (HOSPITAL_COMMUNITY): Payer: 59

## 2022-05-16 ENCOUNTER — Ambulatory Visit (HOSPITAL_COMMUNITY): Payer: 59

## 2022-05-19 ENCOUNTER — Ambulatory Visit (HOSPITAL_COMMUNITY): Payer: 59

## 2022-05-21 ENCOUNTER — Ambulatory Visit (HOSPITAL_COMMUNITY): Payer: 59

## 2022-05-23 ENCOUNTER — Ambulatory Visit (HOSPITAL_COMMUNITY): Payer: 59

## 2022-05-26 ENCOUNTER — Ambulatory Visit (HOSPITAL_COMMUNITY): Payer: 59

## 2022-05-26 ENCOUNTER — Telehealth (HOSPITAL_COMMUNITY): Payer: Self-pay | Admitting: *Deleted

## 2022-05-26 NOTE — Telephone Encounter (Signed)
Received phone call from patient who continues to have cold-like symptoms. Pt sounds congested and coughs periodically through the conversation.  Pt has not sought medical advice or get tested for flu/covid/strep.  Continues to take OTC medications but doesn't appear to be getting better. Advised pt that since she had not started exercise nor called regarding her absence, per protocol she would be dropped.  Karina Pierce explained that she was told not to come if she is sick but didn't know she should call us.  Karina Pierce oriented on 11/7 and scheduled to begin exercise on 11/13. Plans to schedule a virtual visit and call Cardiac rehab by Friday 12/1 with an update. If unable to start by this deadline will discharge from program. Karina Lineman RN, BSN Cardiac and Pulmonary Rehab Nurse Navigator

## 2022-05-28 ENCOUNTER — Ambulatory Visit (HOSPITAL_COMMUNITY): Payer: 59

## 2022-05-30 ENCOUNTER — Ambulatory Visit (HOSPITAL_COMMUNITY): Payer: 59

## 2022-06-02 ENCOUNTER — Ambulatory Visit (HOSPITAL_COMMUNITY): Payer: 59

## 2022-06-03 ENCOUNTER — Encounter (HOSPITAL_COMMUNITY): Payer: Self-pay | Admitting: *Deleted

## 2022-06-03 NOTE — Progress Notes (Signed)
Pt did not show/call regarding cardiac rehab.  Pt oriented on 11/7 but did not return for exercise.  Will discharge this pt from Cardiac rehab due to non attendance and non notification protocol. Alanson Aly, BSN Cardiac and Emergency planning/management officer

## 2022-06-04 ENCOUNTER — Ambulatory Visit (HOSPITAL_COMMUNITY): Payer: 59

## 2022-06-06 ENCOUNTER — Ambulatory Visit (HOSPITAL_COMMUNITY): Payer: 59

## 2022-06-07 ENCOUNTER — Emergency Department (HOSPITAL_COMMUNITY)
Admission: EM | Admit: 2022-06-07 | Discharge: 2022-06-08 | Discharge status: Against Medical Advice | Payer: 59 | Attending: Student | Admitting: Student

## 2022-06-07 ENCOUNTER — Encounter (HOSPITAL_COMMUNITY): Payer: Self-pay

## 2022-06-07 ENCOUNTER — Emergency Department (HOSPITAL_COMMUNITY): Payer: 59

## 2022-06-07 DIAGNOSIS — Z5321 Procedure and treatment not carried out due to patient leaving prior to being seen by health care provider: Secondary | ICD-10-CM | POA: Insufficient documentation

## 2022-06-07 DIAGNOSIS — R319 Hematuria, unspecified: Secondary | ICD-10-CM | POA: Diagnosis not present

## 2022-06-07 DIAGNOSIS — R109 Unspecified abdominal pain: Secondary | ICD-10-CM | POA: Insufficient documentation

## 2022-06-07 LAB — CBC WITH DIFFERENTIAL/PLATELET
Abs Immature Granulocytes: 0.01 10*3/uL (ref 0.00–0.07)
Basophils Absolute: 0 10*3/uL (ref 0.0–0.1)
Basophils Relative: 0 %
Eosinophils Absolute: 0.1 10*3/uL (ref 0.0–0.5)
Eosinophils Relative: 2 %
HCT: 37.8 % (ref 36.0–46.0)
Hemoglobin: 11.9 g/dL — ABNORMAL LOW (ref 12.0–15.0)
Immature Granulocytes: 0 %
Lymphocytes Relative: 43 %
Lymphs Abs: 2.8 10*3/uL (ref 0.7–4.0)
MCH: 27.1 pg (ref 26.0–34.0)
MCHC: 31.5 g/dL (ref 30.0–36.0)
MCV: 86.1 fL (ref 80.0–100.0)
Monocytes Absolute: 0.6 10*3/uL (ref 0.1–1.0)
Monocytes Relative: 9 %
Neutro Abs: 3.1 10*3/uL (ref 1.7–7.7)
Neutrophils Relative %: 46 %
Platelets: 380 10*3/uL (ref 150–400)
RBC: 4.39 MIL/uL (ref 3.87–5.11)
RDW: 13.1 % (ref 11.5–15.5)
WBC: 6.6 10*3/uL (ref 4.0–10.5)
nRBC: 0 % (ref 0.0–0.2)

## 2022-06-07 LAB — URINALYSIS, ROUTINE W REFLEX MICROSCOPIC
Bacteria, UA: NONE SEEN
Bilirubin Urine: NEGATIVE
Glucose, UA: 50 mg/dL — AB
Hgb urine dipstick: NEGATIVE
Ketones, ur: NEGATIVE mg/dL
Leukocytes,Ua: NEGATIVE
Nitrite: NEGATIVE
Protein, ur: 100 mg/dL — AB
Specific Gravity, Urine: 1.009 (ref 1.005–1.030)
pH: 5 (ref 5.0–8.0)

## 2022-06-07 LAB — COMPREHENSIVE METABOLIC PANEL
ALT: 74 U/L — ABNORMAL HIGH (ref 0–44)
AST: 61 U/L — ABNORMAL HIGH (ref 15–41)
Albumin: 3.5 g/dL (ref 3.5–5.0)
Alkaline Phosphatase: 116 U/L (ref 38–126)
Anion gap: 7 (ref 5–15)
BUN: 9 mg/dL (ref 8–23)
CO2: 26 mmol/L (ref 22–32)
Calcium: 9.4 mg/dL (ref 8.9–10.3)
Chloride: 108 mmol/L (ref 98–111)
Creatinine, Ser: 0.84 mg/dL (ref 0.44–1.00)
GFR, Estimated: >60 mL/min (ref >60)
Glucose, Bld: 208 mg/dL — ABNORMAL HIGH (ref 70–99)
Potassium: 4 mmol/L (ref 3.5–5.1)
Sodium: 141 mmol/L (ref 135–145)
Total Bilirubin: 0.8 mg/dL (ref 0.3–1.2)
Total Protein: 6.8 g/dL (ref 6.5–8.1)

## 2022-06-07 LAB — LIPASE, BLOOD: Lipase: 35 U/L (ref 11–51)

## 2022-06-07 NOTE — ED Provider Triage Note (Signed)
Emergency Medicine Provider Triage Evaluation Note  Karina Pierce , a 66 y.o. female  was evaluated in triage.  Pt complains of right-sided flank pain which began approximate 2 days ago.  She states that for the past days she has noted some blood in her urine.  She denies nausea, vomiting.  She does state that she briefly had some pain on the left side as well.  Currently planing of right-sided flank pain and hematuria.  Patient denies history of kidney stones.  Review of Systems  Positive: As above Negative: As above  Physical Exam  BP (!) 142/66 (BP Location: Left Arm)   Pulse 72   Temp 98.2 F (36.8 C) (Oral)   Resp 18   SpO2 96%  Gen:   Awake, no distress   Resp:  Normal effort  MSK:   Moves extremities without difficulty  Other:    Medical Decision Making  Medically screening exam initiated at 3:35 PM.  Appropriate orders placed.  Jelina Paulsen was informed that the remainder of the evaluation will be completed by another provider, this initial triage assessment does not replace that evaluation, and the importance of remaining in the ED until their evaluation is complete.     Darrick Grinder, PA-C 06/07/22 1536

## 2022-06-07 NOTE — ED Triage Notes (Signed)
Pt presents with c/o right flank pain. Pt reports that yesterday, her left flank was hurting, today it is the right. Pt also reports hematuria. Pt denies any hx of kidney stones.

## 2022-06-09 ENCOUNTER — Ambulatory Visit (HOSPITAL_COMMUNITY): Payer: 59

## 2022-06-11 ENCOUNTER — Ambulatory Visit (HOSPITAL_COMMUNITY): Payer: 59

## 2022-06-13 ENCOUNTER — Ambulatory Visit (HOSPITAL_COMMUNITY): Payer: 59

## 2022-06-16 ENCOUNTER — Ambulatory Visit (HOSPITAL_COMMUNITY): Payer: 59

## 2022-06-18 ENCOUNTER — Ambulatory Visit (HOSPITAL_COMMUNITY): Payer: 59

## 2022-06-20 ENCOUNTER — Ambulatory Visit (HOSPITAL_COMMUNITY): Payer: 59

## 2022-06-25 ENCOUNTER — Ambulatory Visit (HOSPITAL_COMMUNITY): Payer: 59

## 2022-06-27 ENCOUNTER — Ambulatory Visit (HOSPITAL_COMMUNITY): Payer: 59

## 2022-07-02 ENCOUNTER — Ambulatory Visit (HOSPITAL_COMMUNITY): Payer: 59

## 2022-07-03 ENCOUNTER — Other Ambulatory Visit: Payer: Self-pay

## 2022-07-03 MED ORDER — CLOPIDOGREL BISULFATE 75 MG PO TABS: 75.0000 mg | ORAL_TABLET | Freq: Every day | ORAL | 2 refills | Status: DC

## 2022-07-04 ENCOUNTER — Ambulatory Visit (HOSPITAL_COMMUNITY): Payer: 59

## 2022-08-11 ENCOUNTER — Other Ambulatory Visit (HOSPITAL_COMMUNITY): Payer: Self-pay

## 2022-09-10 NOTE — Progress Notes (Unsigned)
Cardiology Office Note   Date:  09/11/2022   ID:  Karina Pierce, DOB Mar 27, 1956, MRN [id number]  PCP:  Lin Landsman, MD  Cardiologist:   Minus Breeding, MD   Chief Complaint  Patient presents with   Coronary Artery Disease      History of Present Illness: Karina Pierce is a 67 y.o. female who presents for non-STEMI with DES to the RCA.  This happened in June.  She presented initially with syncope.  She has a history of CAD with NSTEMI . hypertension, hyperlipidemia, type 2 diabetes mellitus, anemia, and former tobacco abuse.  Chest CTA was negative for PE but did show areas of ground glass attenuation and mild septal thickening bilaterally which could suggest mild interstitial edema or possible interstitial lung disease. Echo showed LVEF of 60-65% with normal wall motion, mild MR, and mildly elevated PASP of 39.8 mmHg. LHC on 6/5 showed 95% stenosis of proximal to mid RCA and 30% stenosis of mid LAD. She underwent successful PCI with DES to RCA. She initially started on DAPT with Aspirin and Brilinta but developed significant dyspnea so was transitioned to Plavix prior to discharge. She was also started on a beta-blocker and high-intensity statin.  Since she was last seen she has done well.  She does not exercise very much because she has vision problems.  She has none of the symptoms that prompted her evaluation. The patient denies any new symptoms such as chest discomfort, neck or arm discomfort. There has been no new shortness of breath, PND or orthopnea. There have been no reported palpitations, presyncope or syncope.   Past Medical History:  Diagnosis Date   Diabetes mellitus without complication ([place])    Hyperlipidemia     Past Surgical History:  Procedure Laterality Date   CARDIAC CATHETERIZATION     CORONARY STENT INTERVENTION N/A 12/02/2021   Procedure: CORONARY STENT INTERVENTION;  Surgeon: Lorretta Harp, MD;  Location: [place] [facility];  Service: Cardiovascular;   Laterality: N/A;   KNEE SURGERY     LEFT HEART CATH AND CORONARY ANGIOGRAPHY N/A 12/02/2021   Procedure: LEFT HEART CATH AND CORONARY ANGIOGRAPHY;  Surgeon: Lorretta Harp, MD;  Location: [place] [facility];  Service: Cardiovascular;  Laterality: N/A;     Current Outpatient Medications  Medication Sig Dispense Refill   acetaminophen (TYLENOL) 500 MG tablet Take 1 tablet (500 mg total) by mouth every 6 (six) hours as needed. (Patient taking differently: Take 500 mg by mouth every 6 (six) hours as needed for moderate pain.) 30 tablet 0   aspirin EC 81 MG tablet Take 1 tablet (81 mg total) by mouth daily. Swallow whole. 30 tablet 12   clopidogrel (PLAVIX) 75 MG tablet Take 1 tablet (75 mg total) by mouth daily. 90 tablet 2   escitalopram (LEXAPRO) 10 MG tablet Take 10 mg by mouth daily.     FARXIGA 10 MG TABS tablet Take 10 mg by mouth daily.     glipiZIDE (GLUCOTROL XL) 10 MG 24 hr tablet Take 10 mg by mouth daily with breakfast.     metFORMIN (GLUCOPHAGE) 500 MG tablet Take 500 mg by mouth 2 (two) times daily.     metoprolol tartrate (LOPRESSOR) 25 MG tablet TAKE ONE-HALF TABLET BY MOUTH  TWICE DAILY 90 tablet 3   nitroGLYCERIN (NITROSTAT) 0.4 MG SL tablet Place 1 tablet (0.4 mg total) under the tongue every 5 (five) minutes x 3 doses as needed for chest pain. 25 tablet 2  oxyCODONE-acetaminophen (PERCOCET) 5-325 MG tablet Take 1 tablet by mouth every 4 (four) hours as needed. 10 tablet 0   rosuvastatin (CRESTOR) 40 MG tablet Take 1 tablet (40 mg total) by mouth daily at 6 PM. (Patient taking differently: Take 20 mg by mouth daily at 6 PM. Take half 0.5 Tablet Daily in The Evening) 90 tablet 0   No current facility-administered medications for this visit.    Allergies:   Patient has no known allergies.    ROS:  Please see the history of present illness.   Otherwise, review of systems are positive for none.   All other systems are reviewed and negative.    PHYSICAL EXAM: VS:  BP  126/72   Pulse 67   Ht '5\' 9"'$  (1.753 m)   Wt 244 lb (110.7 kg)   SpO2 97%   BMI 36.03 kg/m  , BMI Body mass index is 36.03 kg/m. GENERAL:  Well appearing NECK:  No jugular venous distention, waveform within normal limits, carotid upstroke brisk and symmetric, no bruits, no thyromegaly LUNGS:  Clear to auscultation bilaterally CHEST:  Unremarkable HEART:  PMI not displaced or sustained,S1 and S2 within normal limits, no S3, no S4, no clicks, no rubs, no murmurs ABD:  Flat, positive bowel sounds normal in frequency in pitch, no bruits, no rebound, no guarding, no midline pulsatile mass, no hepatomegaly, no splenomegaly EXT:  2 plus pulses throughout, no edema, no cyanosis no clubbing  EKG:  EKG is  ordered today. Sinus rhythm, rate 67, axis within normal limits, intervals within normal limits, nonspecific inferior and lateral T wave changes.   Recent Labs: 12/10/2021: BNP 81.0 06/07/2022: ALT 74; BUN 9; Creatinine, Ser 0.84; Hemoglobin 11.9; Platelets 380; Potassium 4.0; Sodium 141    Lipid Panel    Component Value Date/Time   CHOL 221 (H) 12/01/2021 0302   TRIG 153 (H) 12/01/2021 0302   HDL 47 12/01/2021 0302   CHOLHDL 4.7 12/01/2021 0302   VLDL 31 12/01/2021 0302   LDLCALC 143 (H) 12/01/2021 0302      Wt Readings from Last 3 Encounters:  09/11/22 244 lb (110.7 kg)  05/06/22 241 lb 6.5 oz (109.5 kg)  03/14/22 246 lb 6.4 oz (111.8 kg)     Cardiac cath  June 2023  Diagnostic Dominance: Right Intervention    Echo: 11/30/21  1. Left ventricular ejection fraction, by estimation, is 60 to 65%. The  left ventricle has normal function. The left ventricle has no regional  wall motion abnormalities. Left ventricular diastolic parameters are  indeterminate.   2. Right ventricular systolic function is normal. The right ventricular  size is normal. There is mildly elevated pulmonary artery systolic  pressure. The estimated right ventricular systolic pressure is [id number] mmHg.   3.  The mitral valve is normal in structure. Mild mitral valve  regurgitation.   4. The aortic valve is tricuspid. Aortic valve regurgitation is trivial.  Aortic valve sclerosis is present, with no evidence of aortic valve  stenosis.   5. The inferior vena cava is dilated in size with >50% respiratory  variability, suggesting right atrial pressure of 8 mmHg.    Other studies Reviewed: Additional studies/ records that were reviewed today include: Labs Review of the above records demonstrates:  Please see elsewhere in the note.     ASSESSMENT AND PLAN:  CAD:  The patient has no new sypmtoms.  No further cardiovascular testing is indicated.  We will continue with aggressive risk reduction and meds as listed.she  can stop the Plavix in June.  Dyslipidemia:   LDL was previously 143.  I will check a lipid profile and LP(a) today with a goal LDL in the 70s.  We talked about a plant-based diet.  She might need a PCSK9 inhibitor or at least the addition of Zetia.    DM: Her A1c was 10.  I will check an A1c.  Her primary provider suggested [facility] and I would agree with that.  Syncope: She had no further events.  No further workup.   Current medicines are reviewed at length with the patient today.  The patient does not have concerns regarding medicines.  The following changes have been made:   None  Labs/ tests ordered today include:   Orders Placed This Encounter  Procedures   Lipoprotein A (LPA)   Lipid panel   Hemoglobin A1c   EKG 12-Lead     Disposition:   FU with me in 1 year   Signed, Minus Breeding, MD  09/11/2022 8:35 AM    [place]

## 2022-09-11 ENCOUNTER — Encounter: Payer: Self-pay | Admitting: Cardiology

## 2022-09-11 ENCOUNTER — Ambulatory Visit: Payer: 59 | Attending: Cardiology | Admitting: Cardiology

## 2022-09-11 VITALS — BP 126/72 | HR 67 | Ht 69.0 in | Wt 244.0 lb

## 2022-09-11 DIAGNOSIS — E118 Type 2 diabetes mellitus with unspecified complications: Secondary | ICD-10-CM

## 2022-09-11 DIAGNOSIS — E785 Hyperlipidemia, unspecified: Secondary | ICD-10-CM

## 2022-09-11 DIAGNOSIS — I2510 Atherosclerotic heart disease of native coronary artery without angina pectoris: Secondary | ICD-10-CM | POA: Diagnosis not present

## 2022-09-11 NOTE — Patient Instructions (Signed)
Medication Instructions:   STOP Plavix at the end of June  *If you need a refill on your cardiac medications before your next appointment, please call your pharmacy*  Lab Work: Your physician recommends that you return for lab work TODAY:  Fasting Lipid Panel Lipoproetin (LPa) Hemoglobin A1c  If you have labs (blood work) drawn today and your tests are completely normal, you will receive your results only by: MyChart Message (if you have MyChart) OR A paper copy in the mail If you have any lab test that is abnormal or we need to change your treatment, we will call you to review the results.  Testing/Procedures: NONE ordered at this time of appointment   Follow-Up: At [facility], you and your health needs are our priority.  As part of our continuing mission to provide you with exceptional heart care, we have created designated Provider Care Teams.  These Care Teams include your primary Cardiologist (physician) and Advanced Practice Providers (APPs -  Physician Assistants and Nurse Practitioners) who all work together to provide you with the care you need, when you need it.  Your next appointment:   1 year(s)  Provider:   Minus Breeding, MD     Other Instructions

## 2022-09-12 LAB — HEMOGLOBIN A1C
Est. average glucose Bld gHb Est-mCnc: 200 mg/dL
Hgb A1c MFr Bld: 8.6 % — ABNORMAL HIGH (ref 4.8–5.6)

## 2022-09-12 LAB — LIPOPROTEIN A (LPA): Lipoprotein (a): 581.4 nmol/L — ABNORMAL HIGH (ref <75.0)

## 2022-09-12 LAB — LIPID PANEL
Chol/HDL Ratio: 4.1 ratio (ref 0.0–4.4)
Cholesterol, Total: 256 mg/dL — ABNORMAL HIGH (ref 100–199)
HDL: 62 mg/dL (ref >39)
LDL Chol Calc (NIH): 171 mg/dL — ABNORMAL HIGH (ref 0–99)
Triglycerides: 127 mg/dL (ref 0–149)
VLDL Cholesterol Cal: 23 mg/dL (ref 5–40)

## 2022-09-19 ENCOUNTER — Encounter: Payer: Self-pay | Admitting: *Deleted

## 2022-09-19 ENCOUNTER — Other Ambulatory Visit: Payer: Self-pay | Admitting: *Deleted

## 2022-09-19 DIAGNOSIS — E785 Hyperlipidemia, unspecified: Secondary | ICD-10-CM

## 2022-09-20 IMAGING — CR DG RIBS W/ CHEST 3+V*R*
5 series · 5 of 5 positions shown · non-contrast
Comparison: Chest x-ray 11/30/2021

CLINICAL DATA: Rib pain.

EXAM:
RIGHT RIBS AND CHEST - 3+ VIEW

[w chest pa]
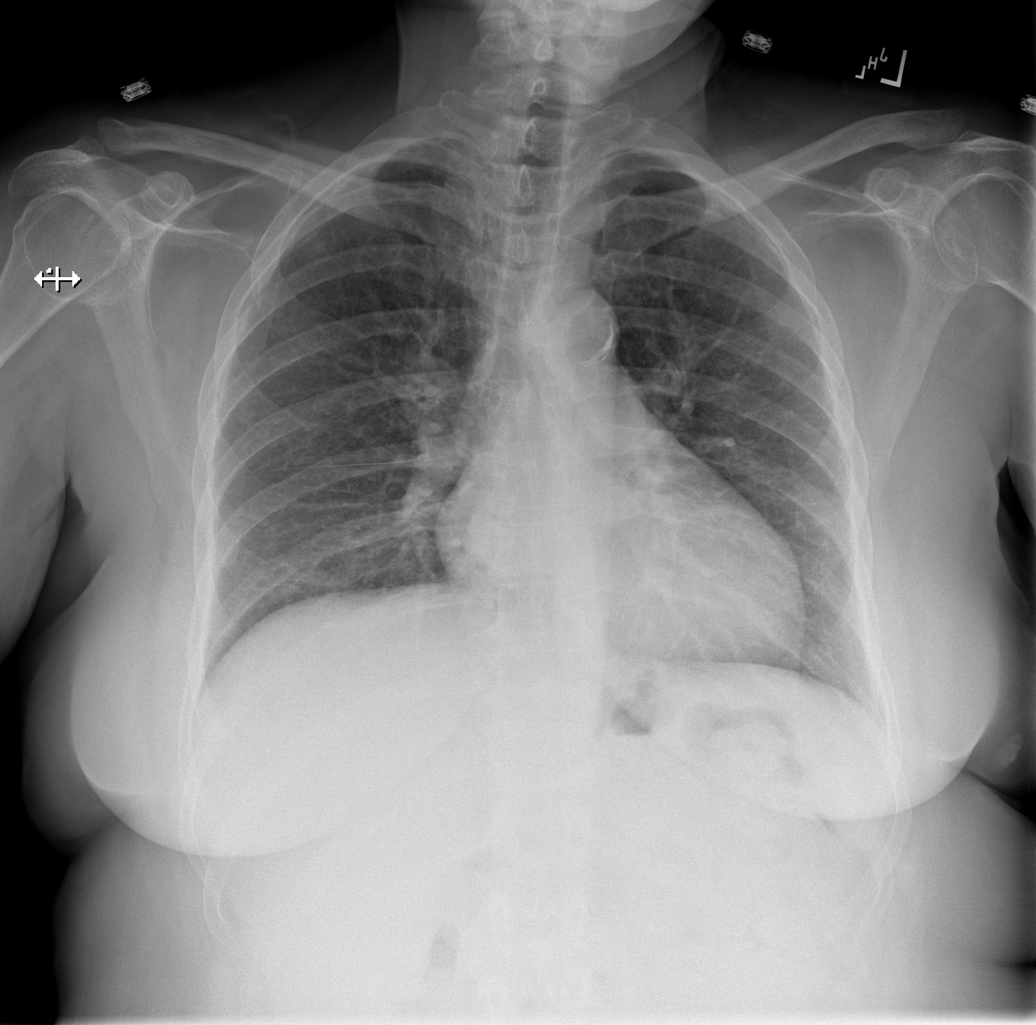

[w ribs ap upper right]
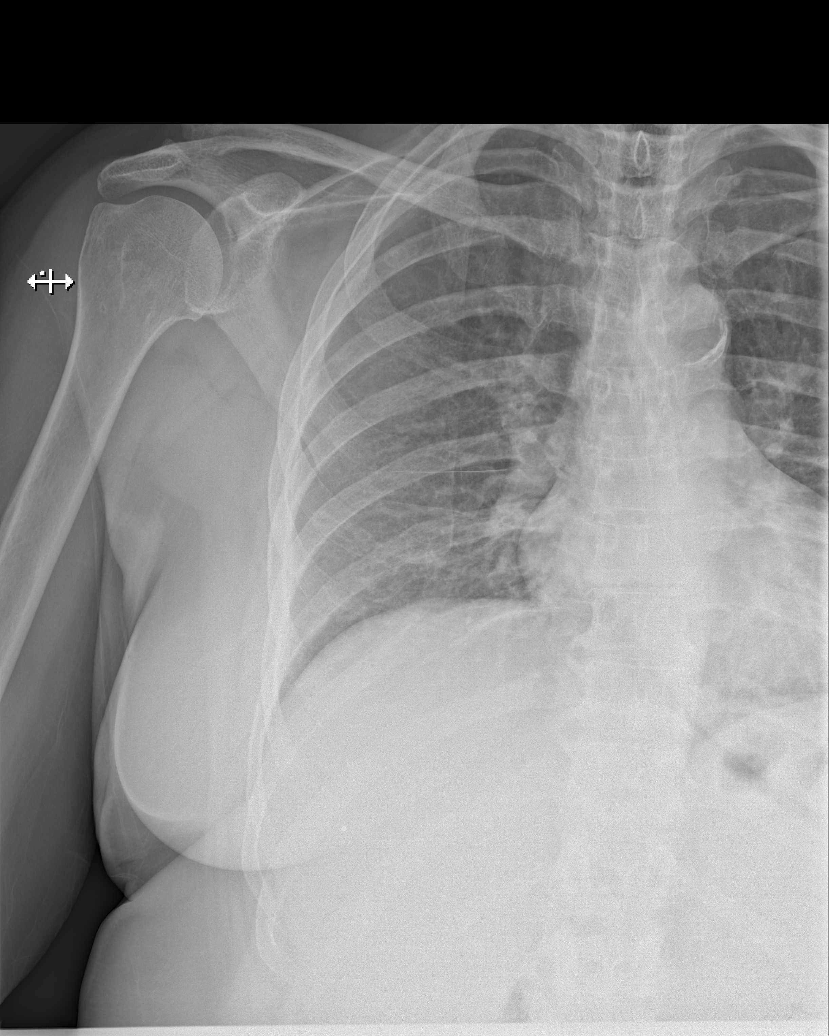

[w ribs ap lower right]
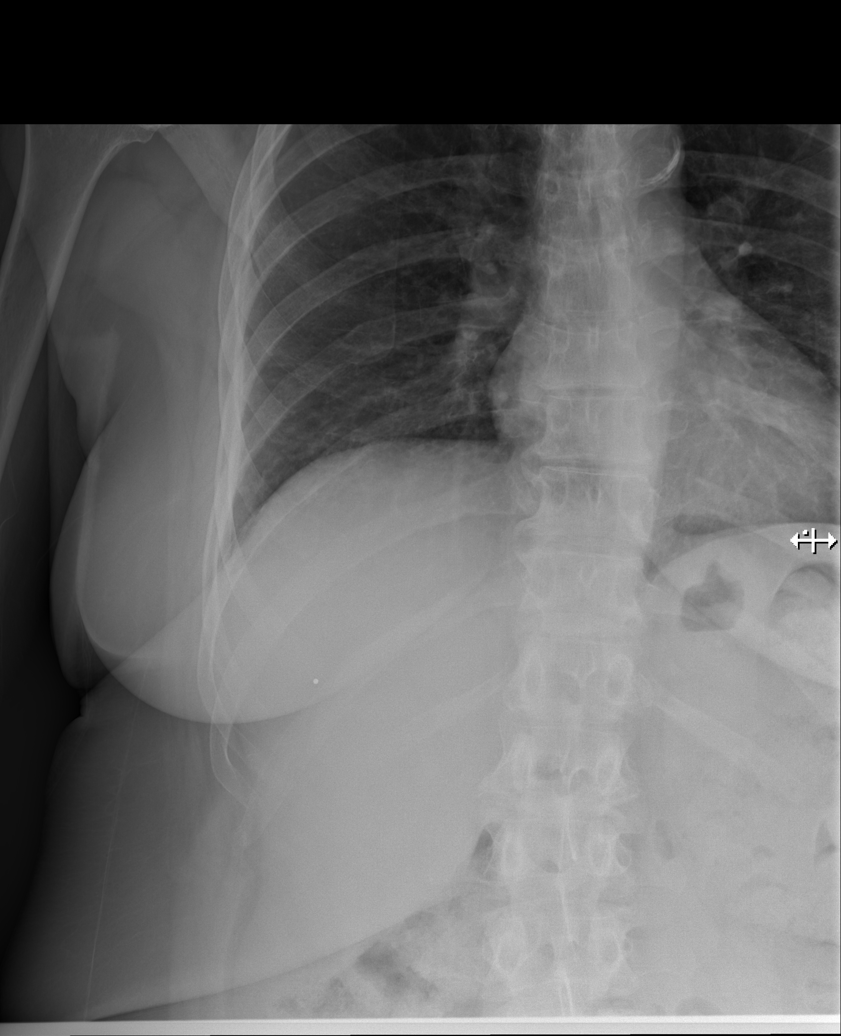

[w ribs obl right (1 of 2)]
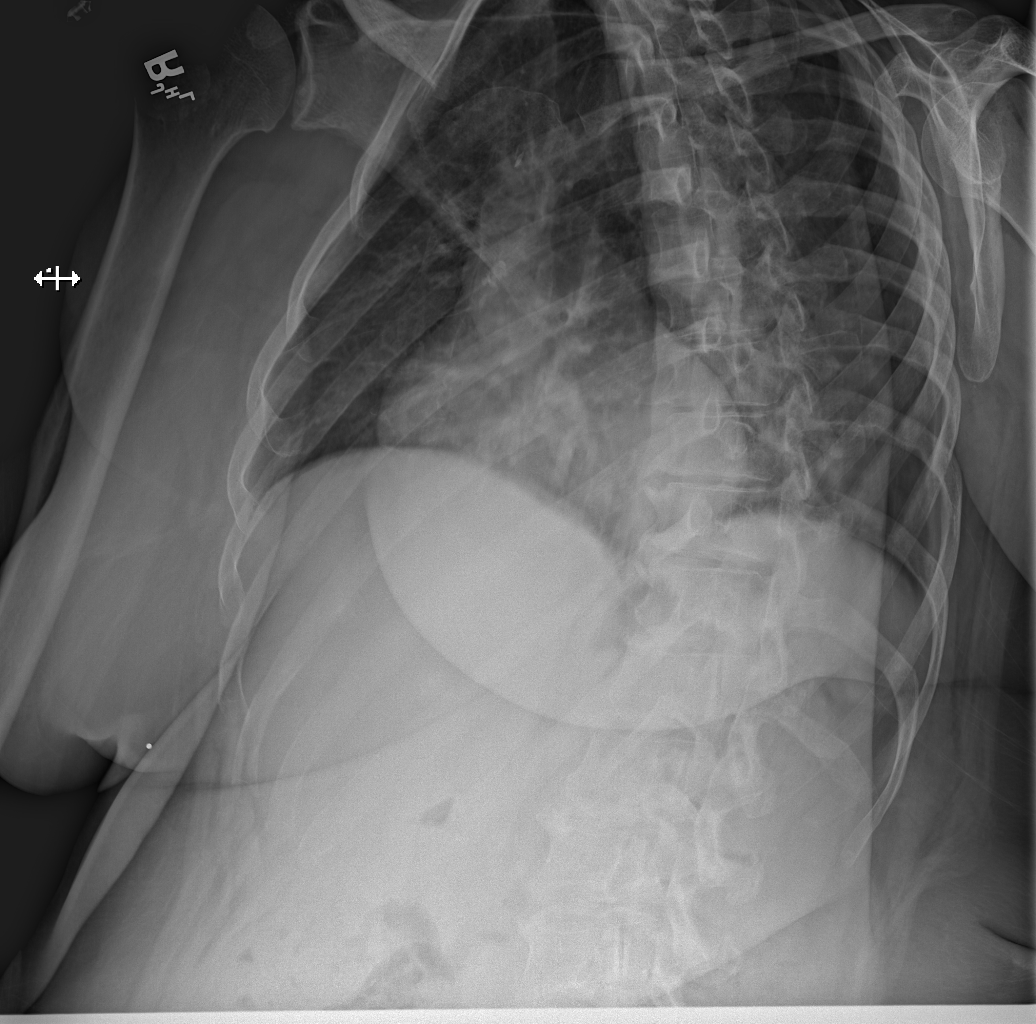

[w ribs obl right (2 of 2)]
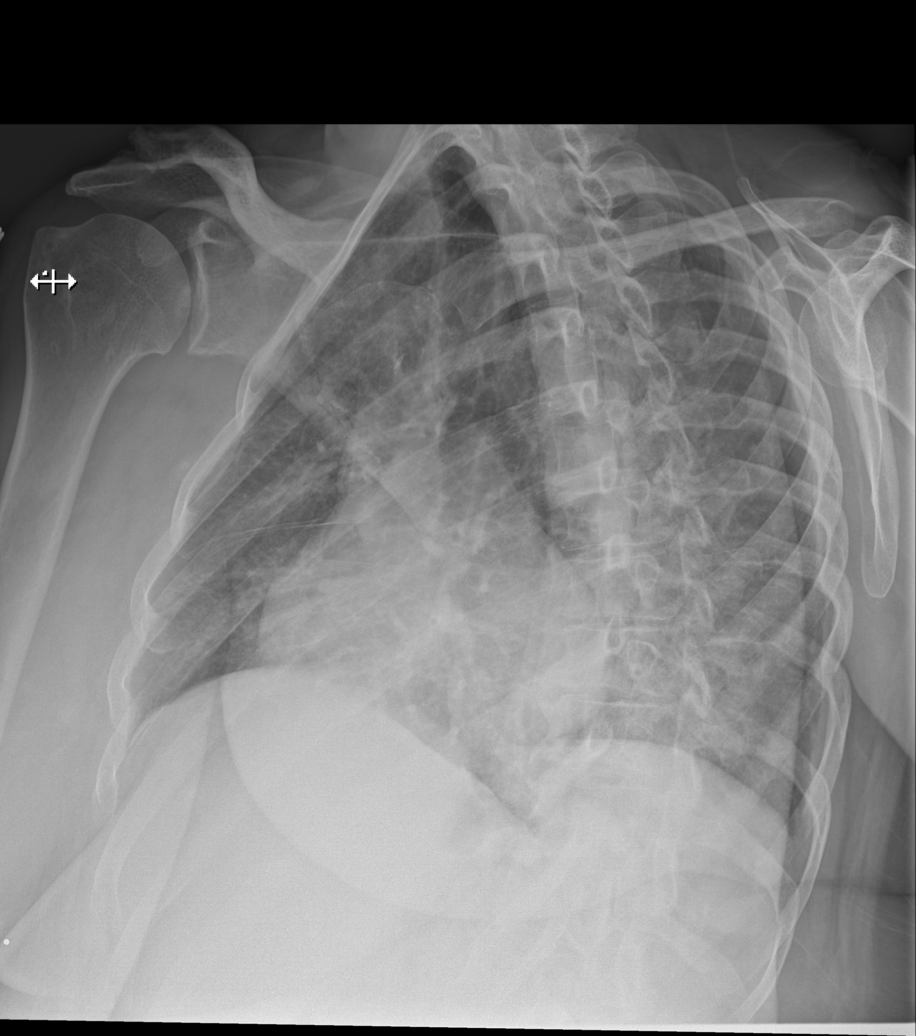

[5 of 5 positions shown; findings below may reference images not displayed]

FINDINGS: No fracture or other bone lesions are seen involving the ribs. There
is no evidence of pneumothorax or pleural effusion. Both lungs are
clear. Heart size and mediastinal contours are within normal limits.
IMPRESSION: Negative.

## 2022-10-09 ENCOUNTER — Encounter: Payer: Self-pay | Admitting: Nurse Practitioner

## 2022-10-09 ENCOUNTER — Ambulatory Visit: Payer: 59 | Attending: Nurse Practitioner | Admitting: Nurse Practitioner

## 2022-10-09 VITALS — BP 116/62 | HR 82 | Ht 69.0 in | Wt 235.8 lb

## 2022-10-09 DIAGNOSIS — I10 Essential (primary) hypertension: Secondary | ICD-10-CM | POA: Diagnosis not present

## 2022-10-09 DIAGNOSIS — I2510 Atherosclerotic heart disease of native coronary artery without angina pectoris: Secondary | ICD-10-CM | POA: Diagnosis not present

## 2022-10-09 DIAGNOSIS — E118 Type 2 diabetes mellitus with unspecified complications: Secondary | ICD-10-CM

## 2022-10-09 DIAGNOSIS — E785 Hyperlipidemia, unspecified: Secondary | ICD-10-CM

## 2022-10-09 MED ORDER — NITROGLYCERIN 0.4 MG SL SUBL: 0.4000 mg | SUBLINGUAL_TABLET | SUBLINGUAL | 2 refills | Status: DC | PRN

## 2022-10-09 NOTE — Patient Instructions (Signed)
Medication Instructions:  No Changes *If you need a refill on your cardiac medications before your next appointment, please call your pharmacy*   Lab Work: None Ordered   Testing/Procedures: None Ordered   Follow-Up: At [facility], you and your health needs are our priority.  As part of our continuing mission to provide you with exceptional heart care, we have created designated Provider Care Teams.  These Care Teams include your primary Cardiologist (physician) and Advanced Practice Providers (APPs -  Physician Assistants and Nurse Practitioners) who all work together to provide you with the care you need, when you need it.  We recommend signing up for the patient portal called "MyChart".  Sign up information is provided on this After Visit Summary.  MyChart is used to connect with patients for Virtual Visits (Telemedicine).  Patients are able to view lab/test results, encounter notes, upcoming appointments, etc.  Non-urgent messages can be sent to your provider as well.   To learn more about what you can do with MyChart, go to [email or web address].    Your next appointment:   3-5 month(s) or First Available  Provider:   Rollene Rotunda, MD

## 2022-10-09 NOTE — Progress Notes (Signed)
Office Visit    Patient Name: Karina Pierce Date of Encounter: 10/09/2022  Primary Care Provider:  Leilani Able, MD Primary Cardiologist:  Rollene Rotunda, MD  Chief Complaint    67 year old female with a history of CAD s/p DES-RCA, hypertension, hyperlipidemia, type 2 diabetes, anemia, and former tobacco use who presents for follow-up related to CAD.  Past Medical History    Past Medical History:  Diagnosis Date   Diabetes mellitus without complication    Hyperlipidemia    Past Surgical History:  Procedure Laterality Date   CARDIAC CATHETERIZATION     CORONARY STENT INTERVENTION N/A 12/02/2021   Procedure: CORONARY STENT INTERVENTION;  Surgeon: Runell Gess, MD;  Location: [facility];  Service: Cardiovascular;  Laterality: N/A;   KNEE SURGERY     LEFT HEART CATH AND CORONARY ANGIOGRAPHY N/A 12/02/2021   Procedure: LEFT HEART CATH AND CORONARY ANGIOGRAPHY;  Surgeon: Runell Gess, MD;  Location: [facility];  Service: Cardiovascular;  Laterality: N/A;    Allergies  No Known Allergies   Labs/Other Studies Reviewed    The following studies were reviewed today: LHC 2021/12/31:    Mid LAD lesion is 30% stenosed.   Prox RCA to Mid RCA lesion is 95% stenosed.   A drug-eluting stent was successfully placed using a STENT ONYX FRONTIER 2.5X18.   Post intervention, there is a 0% residual stenosis.   Echo 12-31-21: IMPRESSIONS     1. Left ventricular ejection fraction, by estimation, is 60 to 65%. The  left ventricle has normal function. The left ventricle has no regional  wall motion abnormalities. Left ventricular diastolic parameters are  indeterminate.   2. Right ventricular systolic function is normal. The right ventricular  size is normal. There is mildly elevated pulmonary artery systolic  pressure. The estimated right ventricular systolic pressure is 39.8 mmHg.   3. The mitral valve is normal in structure. Mild mitral valve  regurgitation.   4.  The aortic valve is tricuspid. Aortic valve regurgitation is trivial.  Aortic valve sclerosis is present, with no evidence of aortic valve  stenosis.   5. The inferior vena cava is dilated in size with >50% respiratory  variability, suggesting right atrial pressure of 8 mmHg.   Cardiac monitor 12/2021: Normal sinus rhyth Few runs of supraventricular tachycardia with the longest being 5 beats No sustained arrhythmias     Recent Labs: 12/10/2021: BNP 81.0 06/07/2022: ALT 74; BUN 9; Creatinine, Ser 0.84; Hemoglobin 11.9; Platelets 380; Potassium 4.0; Sodium 141  Recent Lipid Panel    Component Value Date/Time   CHOL 256 (H) 09/11/2022 0832   TRIG 127 09/11/2022 0832   HDL 62 09/11/2022 0832   CHOLHDL 4.1 09/11/2022 0832   CHOLHDL 4.7 12/01/2021 0302   VLDL 31 12/01/2021 0302   LDLCALC 171 (H) 09/11/2022 0832    History of Present Illness    67 year old female with the above past medical history including CAD s/p DES-RCA, hypertension, hyperlipidemia, type 2 diabetes, anemia, and former tobacco use.  She was hospitalized in Jul 04, 2023in the setting of non-STEMI s/p DES-RCA.  Echocardiogram showed EF 60 to 65%, normal LV function, mild MR, mildly elevated PASP of 39.8 mmHg.  She was last seen in the office on 09/11/2022 and was stable from a cardiac standpoint.  She presents today for follow-up accompanied by her daughter.  Since her last visit she has been stable from a cardiac standpoint. Approximately 2 weeks ago she noted multiple episodes of intermittent  chest discomfort that occurred both at rest and with activity, not similar to prior anginal equivalent.  She denies any other associated symptoms, denies dyspnea, edema, PND, orthopnea, weight gain.  She denies any further chest pain.  Home Medications    Current Outpatient Medications  Medication Sig Dispense Refill   acetaminophen (TYLENOL) 500 MG tablet Take 1 tablet (500 mg total) by mouth every 6 (six) hours as needed.  (Patient taking differently: Take 500 mg by mouth every 6 (six) hours as needed for moderate pain.) 30 tablet 0   aspirin EC 81 MG tablet Take 1 tablet (81 mg total) by mouth daily. Swallow whole. 30 tablet 12   clopidogrel (PLAVIX) 75 MG tablet Take 1 tablet (75 mg total) by mouth daily. 90 tablet 2   escitalopram (LEXAPRO) 10 MG tablet Take 10 mg by mouth daily.     FARXIGA 10 MG TABS tablet Take 10 mg by mouth daily.     glipiZIDE (GLUCOTROL XL) 10 MG 24 hr tablet Take 10 mg by mouth daily with breakfast.     metFORMIN (GLUCOPHAGE) 500 MG tablet Take 500 mg by mouth 2 (two) times daily.     metoprolol tartrate (LOPRESSOR) 25 MG tablet TAKE ONE-HALF TABLET BY MOUTH  TWICE DAILY 90 tablet 3   MOUNJARO 2.5 MG/0.5ML Pen Inject 2.5 mg into the skin once a week.     oxyCODONE-acetaminophen (PERCOCET) 5-325 MG tablet Take 1 tablet by mouth every 4 (four) hours as needed. 10 tablet 0   rosuvastatin (CRESTOR) 40 MG tablet Take 1 tablet (40 mg total) by mouth daily at 6 PM. (Patient taking differently: Take 20 mg by mouth daily at 6 PM. Take half 0.5 Tablet Daily in The Evening) 90 tablet 0   traMADol (ULTRAM) 50 MG tablet Take 50 mg by mouth every 6 (six) hours as needed for moderate pain.     nitroGLYCERIN (NITROSTAT) 0.4 MG SL tablet Place 1 tablet (0.4 mg total) under the tongue every 5 (five) minutes x 3 doses as needed for chest pain. 25 tablet 2   No current facility-administered medications for this visit.     Review of Systems    She denies chest pain, palpitations, dyspnea, pnd, orthopnea, n, v, dizziness, syncope, edema, weight gain, or early satiety. All other systems reviewed and are otherwise negative except as noted above.   Physical Exam    VS:  BP 116/62 (BP Location: Left Arm, Patient Position: Sitting, Cuff Size: Normal)   Pulse 82   Ht 5\' 9"  (1.753 m)   Wt 235 lb 12.8 oz (107 kg)   SpO2 95%   BMI 34.82 kg/m   GEN: Well nourished, well developed, in no acute  distress. HEENT: normal. Neck: Supple, no JVD, carotid bruits, or masses. Cardiac: RRR, no murmurs, rubs, or gallops. No clubbing, cyanosis, edema.  Radials/DP/PT 2+ and equal bilaterally.  Respiratory:  Respirations regular and unlabored, clear to auscultation bilaterally. GI: Soft, nontender, nondistended, BS + x 4. MS: no deformity or atrophy. Skin: warm and dry, no rash. Neuro:  Strength and sensation are intact. Psych: Normal affect.  Accessory Clinical Findings    ECG personally reviewed by me today - No EKG in office today.   Lab Results  Component Value Date   WBC 6.6 06/07/2022   HGB 11.9 (L) 06/07/2022   HCT 37.8 06/07/2022   MCV 86.1 06/07/2022   PLT 380 06/07/2022   Lab Results  Component Value Date   CREATININE 0.84 06/07/2022  BUN 9 06/07/2022   NA 141 06/07/2022   K 4.0 06/07/2022   CL 108 06/07/2022   CO2 26 06/07/2022   Lab Results  Component Value Date   ALT 74 (H) 06/07/2022   AST 61 (H) 06/07/2022   ALKPHOS 116 06/07/2022   BILITOT 0.8 06/07/2022   Lab Results  Component Value Date   CHOL 256 (H) 09/11/2022   HDL 62 09/11/2022   LDLCALC 171 (H) 09/11/2022   TRIG 127 09/11/2022   CHOLHDL 4.1 09/11/2022    Lab Results  Component Value Date   HGBA1C 8.6 (H) 09/11/2022    Assessment & Plan    1. CAD: S/p DES-RCA in 11/2021.  Approximately 2 weeks ago she had multiple episodes of intermittent chest discomfort that occurred both at rest and with activity, prior anginal equivalent.  Denies any associated symptoms.  She denies any further chest pain. We discussed possible trial of Imdur, possible stress test, patient prefers to continue to monitor symptoms for now.  If symptoms persist, consider trial low-dose Imdur versus cardiac PET stress test.  Provided refill of nitroglycerin.  Discussed ED precautions.  Continue aspirin, Plavix, metoprolol, Crestor.  2. Hypertension: BP well controlled. Continue current antihypertensive regimen.   3.  Hyperlipidemia: LDL was 171 in 08/2022. She has not been taking her statin consistently.  Encouraged adherence to statin therapy. Referred to lipid clinic pharmD, pending appointment.  Continue Crestor.  4. Type 2 diabetes: A1c was  8.6 in 08/2022.  Monitored and managed per PCP.  5. Disposition: Follow-up in 3 to 5 months with Dr. Antoine PocheHochrein.     Karina GrapesEmily C Rashawn Rayman, NP 10/09/2022, 12:07 PM

## 2022-12-01 NOTE — Progress Notes (Deleted)
Office Visit    Patient Name: Karina Pierce Date of Encounter: 12/01/2022  Primary Care Provider:  Leilani Able, MD Primary Cardiologist:  Rollene Rotunda, MD  Chief Complaint    Hyperlipidemia   Significant Past Medical History   CAD 11/2021 NSTEMI w/ DES to prox-mid RCA  HTN Controlled on metoprolol 12.5 mg bid  DM2 08/2022 - A1c 8.6 on Mounjaro, metformin, glipizide, dapagliflozin        No Known Allergies  History of Present Illness    Karina Pierce is a 67 y.o. female patient of Dr Antoine Poche,  Insurance Carrier:   [employer] Dual Complete $0 copay  LDL Cholesterol goal:  LDL < 55  Current Medications:   rosuvastatin 40 mg qd  Previously tried:    Family Hx:     Social Hx: Tobacco: Alcohol:      Diet:      Exercise:    Accessory Clinical Findings   09/11/22 Lp(a) 581.4  Lab Results  Component Value Date   CHOL 256 (H) 09/11/2022   HDL 62 09/11/2022   LDLCALC 171 (H) 09/11/2022   TRIG 127 09/11/2022   CHOLHDL 4.1 09/11/2022    Lab Results  Component Value Date   ALT 74 (H) 06/07/2022   AST 61 (H) 06/07/2022   ALKPHOS 116 06/07/2022   BILITOT 0.8 06/07/2022   Lab Results  Component Value Date   CREATININE 0.84 06/07/2022   BUN 9 06/07/2022   NA 141 06/07/2022   K 4.0 06/07/2022   CL 108 06/07/2022   CO2 26 06/07/2022   Lab Results  Component Value Date   HGBA1C 8.6 (H) 09/11/2022    Home Medications    Current Outpatient Medications  Medication Sig Dispense Refill   acetaminophen (TYLENOL) 500 MG tablet Take 1 tablet (500 mg total) by mouth every 6 (six) hours as needed. (Patient taking differently: Take 500 mg by mouth every 6 (six) hours as needed for moderate pain.) 30 tablet 0   aspirin EC 81 MG tablet Take 1 tablet (81 mg total) by mouth daily. Swallow whole. 30 tablet 12   clopidogrel (PLAVIX) 75 MG tablet Take 1 tablet (75 mg total) by mouth daily. 90 tablet 2   escitalopram (LEXAPRO) 10 MG tablet Take 10 mg by mouth daily.     FARXIGA 10  MG TABS tablet Take 10 mg by mouth daily.     glipiZIDE (GLUCOTROL XL) 10 MG 24 hr tablet Take 10 mg by mouth daily with breakfast.     metFORMIN (GLUCOPHAGE) 500 MG tablet Take 500 mg by mouth 2 (two) times daily.     metoprolol tartrate (LOPRESSOR) 25 MG tablet TAKE ONE-HALF TABLET BY MOUTH  TWICE DAILY 90 tablet 3   MOUNJARO 2.5 MG/0.5ML Pen Inject 2.5 mg into the skin once a week.     nitroGLYCERIN (NITROSTAT) 0.4 MG SL tablet Place 1 tablet (0.4 mg total) under the tongue every 5 (five) minutes x 3 doses as needed for chest pain. 25 tablet 2   oxyCODONE-acetaminophen (PERCOCET) 5-325 MG tablet Take 1 tablet by mouth every 4 (four) hours as needed. 10 tablet 0   rosuvastatin (CRESTOR) 40 MG tablet Take 1 tablet (40 mg total) by mouth daily at 6 PM. (Patient taking differently: Take 20 mg by mouth daily at 6 PM. Take half 0.5 Tablet Daily in The Evening) 90 tablet 0   traMADol (ULTRAM) 50 MG tablet Take 50 mg by mouth every 6 (six) hours as needed for moderate pain.  No current facility-administered medications for this visit.     Assessment & Plan    No problem-specific Assessment & Plan notes found for this encounter.   Phillips Hay, PharmD CPP [facility] [address]  [place], [place] [address] [phone]  12/01/2022, 11:58 AM

## 2022-12-02 ENCOUNTER — Encounter: Payer: Self-pay | Admitting: Cardiology

## 2022-12-02 ENCOUNTER — Ambulatory Visit: Payer: 59 | Attending: Cardiology

## 2022-12-09 ENCOUNTER — Encounter: Payer: Self-pay | Admitting: Nurse Practitioner

## 2022-12-13 ENCOUNTER — Observation Stay (HOSPITAL_COMMUNITY)
Admission: EM | Admit: 2022-12-13 | Discharge: 2022-12-14 | Disposition: A | Discharge status: Home / Self Care | Payer: 59 | Attending: Family Medicine | Admitting: Family Medicine

## 2022-12-13 ENCOUNTER — Encounter (HOSPITAL_COMMUNITY): Payer: Self-pay

## 2022-12-13 ENCOUNTER — Emergency Department (HOSPITAL_COMMUNITY): Payer: 59

## 2022-12-13 ENCOUNTER — Observation Stay (HOSPITAL_COMMUNITY): Payer: 59

## 2022-12-13 ENCOUNTER — Other Ambulatory Visit: Payer: Self-pay

## 2022-12-13 DIAGNOSIS — R519 Headache, unspecified: Secondary | ICD-10-CM | POA: Diagnosis not present

## 2022-12-13 DIAGNOSIS — R262 Difficulty in walking, not elsewhere classified: Secondary | ICD-10-CM | POA: Insufficient documentation

## 2022-12-13 DIAGNOSIS — R4701 Aphasia: Secondary | ICD-10-CM | POA: Diagnosis present

## 2022-12-13 DIAGNOSIS — Z79899 Other long term (current) drug therapy: Secondary | ICD-10-CM | POA: Diagnosis not present

## 2022-12-13 DIAGNOSIS — E119 Type 2 diabetes mellitus without complications: Secondary | ICD-10-CM | POA: Insufficient documentation

## 2022-12-13 DIAGNOSIS — F32A Depression, unspecified: Secondary | ICD-10-CM | POA: Diagnosis not present

## 2022-12-13 DIAGNOSIS — Z7982 Long term (current) use of aspirin: Secondary | ICD-10-CM | POA: Diagnosis not present

## 2022-12-13 DIAGNOSIS — Z955 Presence of coronary angioplasty implant and graft: Secondary | ICD-10-CM | POA: Insufficient documentation

## 2022-12-13 DIAGNOSIS — Z7984 Long term (current) use of oral hypoglycemic drugs: Secondary | ICD-10-CM | POA: Insufficient documentation

## 2022-12-13 DIAGNOSIS — I10 Essential (primary) hypertension: Secondary | ICD-10-CM | POA: Diagnosis not present

## 2022-12-13 DIAGNOSIS — I2510 Atherosclerotic heart disease of native coronary artery without angina pectoris: Secondary | ICD-10-CM | POA: Diagnosis not present

## 2022-12-13 DIAGNOSIS — Z7902 Long term (current) use of antithrombotics/antiplatelets: Secondary | ICD-10-CM | POA: Insufficient documentation

## 2022-12-13 DIAGNOSIS — Z87891 Personal history of nicotine dependence: Secondary | ICD-10-CM | POA: Diagnosis not present

## 2022-12-13 DIAGNOSIS — I639 Cerebral infarction, unspecified: Secondary | ICD-10-CM | POA: Diagnosis present

## 2022-12-13 DIAGNOSIS — G459 Transient cerebral ischemic attack, unspecified: Secondary | ICD-10-CM | POA: Diagnosis not present

## 2022-12-13 DIAGNOSIS — E669 Obesity, unspecified: Secondary | ICD-10-CM | POA: Insufficient documentation

## 2022-12-13 DIAGNOSIS — Z794 Long term (current) use of insulin: Secondary | ICD-10-CM | POA: Insufficient documentation

## 2022-12-13 DIAGNOSIS — I6389 Other cerebral infarction: Principal | ICD-10-CM | POA: Insufficient documentation

## 2022-12-13 DIAGNOSIS — R2689 Other abnormalities of gait and mobility: Secondary | ICD-10-CM | POA: Diagnosis not present

## 2022-12-13 DIAGNOSIS — E785 Hyperlipidemia, unspecified: Secondary | ICD-10-CM | POA: Diagnosis present

## 2022-12-13 DIAGNOSIS — E782 Mixed hyperlipidemia: Secondary | ICD-10-CM

## 2022-12-13 LAB — ETHANOL: Alcohol, Ethyl (B): <10 mg/dL (ref <10)

## 2022-12-13 LAB — COMPREHENSIVE METABOLIC PANEL
ALT: 15 U/L (ref 0–44)
AST: 15 U/L (ref 15–41)
Albumin: 3.3 g/dL — ABNORMAL LOW (ref 3.5–5.0)
Alkaline Phosphatase: 85 U/L (ref 38–126)
Anion gap: 8 (ref 5–15)
BUN: 12 mg/dL (ref 8–23)
CO2: 24 mmol/L (ref 22–32)
Calcium: 9.7 mg/dL (ref 8.9–10.3)
Chloride: 104 mmol/L (ref 98–111)
Creatinine, Ser: 1.04 mg/dL — ABNORMAL HIGH (ref 0.44–1.00)
GFR, Estimated: 60 mL/min — ABNORMAL LOW (ref >60)
Glucose, Bld: 181 mg/dL — ABNORMAL HIGH (ref 70–99)
Potassium: 4.1 mmol/L (ref 3.5–5.1)
Sodium: 136 mmol/L (ref 135–145)
Total Bilirubin: 0.9 mg/dL (ref 0.3–1.2)
Total Protein: 6.7 g/dL (ref 6.5–8.1)

## 2022-12-13 LAB — DIFFERENTIAL
Abs Immature Granulocytes: 0.01 10*3/uL (ref 0.00–0.07)
Basophils Absolute: 0 10*3/uL (ref 0.0–0.1)
Basophils Relative: 1 %
Eosinophils Absolute: 0.1 10*3/uL (ref 0.0–0.5)
Eosinophils Relative: 1 %
Immature Granulocytes: 0 %
Lymphocytes Relative: 41 %
Lymphs Abs: 2.7 10*3/uL (ref 0.7–4.0)
Monocytes Absolute: 0.6 10*3/uL (ref 0.1–1.0)
Monocytes Relative: 9 %
Neutro Abs: 3.2 10*3/uL (ref 1.7–7.7)
Neutrophils Relative %: 48 %

## 2022-12-13 LAB — APTT: aPTT: 32 seconds (ref 24–36)

## 2022-12-13 LAB — I-STAT CHEM 8, ED
BUN: 15 mg/dL (ref 8–23)
Calcium, Ion: 1.28 mmol/L (ref 1.15–1.40)
Chloride: 106 mmol/L (ref 98–111)
Creatinine, Ser: 1 mg/dL (ref 0.44–1.00)
Glucose, Bld: 182 mg/dL — ABNORMAL HIGH (ref 70–99)
HCT: 39 % (ref 36.0–46.0)
Hemoglobin: 13.3 g/dL (ref 12.0–15.0)
Potassium: 4.2 mmol/L (ref 3.5–5.1)
Sodium: 139 mmol/L (ref 135–145)
TCO2: 27 mmol/L (ref 22–32)

## 2022-12-13 LAB — CBC
HCT: 38.8 % (ref 36.0–46.0)
Hemoglobin: 12.3 g/dL (ref 12.0–15.0)
MCH: 26.3 pg (ref 26.0–34.0)
MCHC: 31.7 g/dL (ref 30.0–36.0)
MCV: 83.1 fL (ref 80.0–100.0)
Platelets: 403 10*3/uL — ABNORMAL HIGH (ref 150–400)
RBC: 4.67 MIL/uL (ref 3.87–5.11)
RDW: 12.6 % (ref 11.5–15.5)
WBC: 6.5 10*3/uL (ref 4.0–10.5)
nRBC: 0 % (ref 0.0–0.2)

## 2022-12-13 LAB — PROTIME-INR
INR: 1 (ref 0.8–1.2)
Prothrombin Time: 12.9 seconds (ref 11.4–15.2)

## 2022-12-13 LAB — CBG MONITORING, ED: Glucose-Capillary: 162 mg/dL — ABNORMAL HIGH (ref 70–99)

## 2022-12-13 LAB — HEMOGLOBIN A1C
Hgb A1c MFr Bld: 9 % — ABNORMAL HIGH (ref 4.8–5.6)
Mean Plasma Glucose: 211.6 mg/dL

## 2022-12-13 LAB — MAGNESIUM: Magnesium: 1.9 mg/dL (ref 1.7–2.4)

## 2022-12-13 MED ORDER — CLOPIDOGREL BISULFATE 75 MG PO TABS
300.0000 mg | ORAL_TABLET | Freq: Once | ORAL | Status: AC
  Administered 2022-12-13: 300 mg via ORAL
  Filled 2022-12-13: qty 4

## 2022-12-13 MED ORDER — MELATONIN 3 MG PO TABS
3.0000 mg | ORAL_TABLET | Freq: Every evening | ORAL | Status: DC | PRN
  Administered 2022-12-13: 3 mg via ORAL
  Filled 2022-12-13: qty 1

## 2022-12-13 MED ORDER — DIPHENHYDRAMINE HCL 50 MG/ML IJ SOLN
25.0000 mg | Freq: Once | INTRAMUSCULAR | Status: AC
  Administered 2022-12-13: 25 mg via INTRAVENOUS
  Filled 2022-12-13: qty 1

## 2022-12-13 MED ORDER — INSULIN ASPART 100 UNIT/ML IJ SOLN
0.0000 [IU] | Freq: Three times a day (TID) | INTRAMUSCULAR | Status: DC
  Administered 2022-12-14: 2 [IU] via SUBCUTANEOUS

## 2022-12-13 MED ORDER — ONDANSETRON HCL 4 MG/2ML IJ SOLN: 4.0000 mg | Freq: Four times a day (QID) | INTRAMUSCULAR | Status: DC | PRN

## 2022-12-13 MED ORDER — FENTANYL CITRATE PF 50 MCG/ML IJ SOSY: 50.0000 ug | PREFILLED_SYRINGE | INTRAMUSCULAR | Status: DC | PRN

## 2022-12-13 MED ORDER — ACETAMINOPHEN 325 MG PO TABS
650.0000 mg | ORAL_TABLET | Freq: Four times a day (QID) | ORAL | Status: DC | PRN
  Administered 2022-12-13: 650 mg via ORAL
  Filled 2022-12-13: qty 2

## 2022-12-13 MED ORDER — PROCHLORPERAZINE EDISYLATE 10 MG/2ML IJ SOLN
10.0000 mg | Freq: Once | INTRAMUSCULAR | Status: AC
  Administered 2022-12-13: 10 mg via INTRAVENOUS
  Filled 2022-12-13: qty 2

## 2022-12-13 MED ORDER — SODIUM CHLORIDE 0.9 % IV BOLUS
500.0000 mL | Freq: Once | INTRAVENOUS | Status: AC
  Administered 2022-12-13: 500 mL via INTRAVENOUS

## 2022-12-13 MED ORDER — ACETAMINOPHEN 650 MG RE SUPP: 650.0000 mg | Freq: Four times a day (QID) | RECTAL | Status: DC | PRN

## 2022-12-13 MED ORDER — KETOROLAC TROMETHAMINE 15 MG/ML IJ SOLN
15.0000 mg | Freq: Once | INTRAMUSCULAR | Status: AC
  Administered 2022-12-13: 15 mg via INTRAVENOUS
  Filled 2022-12-13: qty 1

## 2022-12-13 MED ORDER — STROKE: EARLY STAGES OF RECOVERY BOOK: Freq: Once | Status: DC

## 2022-12-13 MED ORDER — HYDRALAZINE HCL 20 MG/ML IJ SOLN: 10.0000 mg | INTRAMUSCULAR | Status: DC | PRN

## 2022-12-13 MED ORDER — NALOXONE HCL 0.4 MG/ML IJ SOLN: 0.4000 mg | INTRAMUSCULAR | Status: DC | PRN

## 2022-12-13 MED ORDER — ASPIRIN 81 MG PO CHEW
162.0000 mg | CHEWABLE_TABLET | Freq: Once | ORAL | Status: AC
  Administered 2022-12-13: 162 mg via ORAL
  Filled 2022-12-13: qty 2

## 2022-12-13 MED ORDER — ROSUVASTATIN CALCIUM 20 MG PO TABS
20.0000 mg | ORAL_TABLET | Freq: Every day | ORAL | Status: DC
  Filled 2022-12-13: qty 1

## 2022-12-13 MED ORDER — CLOPIDOGREL BISULFATE 75 MG PO TABS
75.0000 mg | ORAL_TABLET | Freq: Every day | ORAL | Status: DC
  Administered 2022-12-14: 75 mg via ORAL
  Filled 2022-12-13: qty 1

## 2022-12-13 MED ORDER — IOHEXOL 350 MG/ML SOLN
75.0000 mL | Freq: Once | INTRAVENOUS | Status: AC | PRN
  Administered 2022-12-13: 75 mL via INTRAVENOUS

## 2022-12-13 NOTE — ED Notes (Signed)
Report called to Lata RN pt to transfer to [id number] upon return from MRI

## 2022-12-13 NOTE — Plan of Care (Signed)
  Problem: Education: Goal: Knowledge of disease or condition will improve Outcome: Progressing   Problem: Ischemic Stroke/TIA Tissue Perfusion: Goal: Complications of ischemic stroke/TIA will be minimized Outcome: Progressing   Problem: Coping: Goal: Will verbalize positive feelings about self Outcome: Progressing Goal: Will identify appropriate support needs Outcome: Progressing   Problem: Coping: Goal: Ability to adjust to condition or change in health will improve Outcome: Progressing   Problem: Fluid Volume: Goal: Ability to maintain a balanced intake and output will improve Outcome: Progressing   Problem: Health Behavior/Discharge Planning: Goal: Ability to manage health-related needs will improve Outcome: Progressing   Problem: Metabolic: Goal: Ability to maintain appropriate glucose levels will improve Outcome: Progressing   Problem: Tissue Perfusion: Goal: Adequacy of tissue perfusion will improve Outcome: Progressing   Problem: Education: Goal: Knowledge of General Education information will improve Description: Including pain rating scale, medication(s)/side effects and non-pharmacologic comfort measures Outcome: Progressing   Problem: Health Behavior/Discharge Planning: Goal: Ability to manage health-related needs will improve Outcome: Progressing   Problem: Clinical Measurements: Goal: Ability to maintain clinical measurements within normal limits will improve Outcome: Progressing Goal: Will remain free from infection Outcome: Progressing Goal: Diagnostic test results will improve Outcome: Progressing   Problem: Nutrition: Goal: Adequate nutrition will be maintained Outcome: Progressing

## 2022-12-13 NOTE — Consult Note (Signed)
Neurology Consultation Reason for Consult: Aphasia Referring Physician: Rhunette Croft, A  CC: Aphasia  History is obtained from: Patient  HPI: Karina Pierce is a 67 y.o. female who was in her normal state of health until around 6:30 PM at which point she began having a headache.  Her daughter then came in the room to check on her and she realized that the words that she was saying were not making sense.  She knew what she wanted to say, but could not get the words to come out correctly.  The daughter states that it was just gibberish.  This has since improved, and at the current time she just feels like she has mild hesitancy, though I do not appreciate this on exam.  She describes the headache as being unilateral, retro-orbital on the left with photophobia, denies nausea and vomiting.  She denies any history of headaches, including migraines as a younger person, denies ever having an episode like one today.  She had no spots in her vision (she has bad vision at baseline)  LKW: 6:30 PM tnk given?: no, mild symptoms   Past Medical History:  Diagnosis Date   Diabetes mellitus without complication (HCC)    Hyperlipidemia      Family History  Problem Relation Age of Onset   CAD Mother        CABG at ~ 33 yo     Social History:  reports that she has quit smoking. She has never used smokeless tobacco. She reports that she does not drink alcohol and does not use drugs.   Exam: Current vital signs: BP (!) 150/74   Pulse 67   Temp 97.9 F (36.6 C) (Oral)   Resp 14   SpO2 96%  Vital signs in last 24 hours: Temp:  [97.9 F (36.6 C)] 97.9 F (36.6 C) (06/15 1923) Pulse Rate:  [67-80] 67 (06/15 2100) Resp:  [9-18] 14 (06/15 2100) BP: (119-161)/(74-83) 150/74 (06/15 2100) SpO2:  [93 %-98 %] 96 % (06/15 2100)   Physical Exam  Appears well-developed and well-nourished.   Neuro: Mental Status: Patient is awake, alert, oriented to person, place, month, year, and situation. Patient is  able to give a clear and coherent history. No signs of aphasia or neglect Cranial Nerves: II: Visual Fields are full to finger wiggling in the right eye.  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  Pupils are reactive bilaterally, though more sluggish on the left and slightly larger on the left. III,IV, VI: EOMI without ptosis or diploplia.  V: Facial sensation is symmetric to temperature VII: Facial movement is symmetric.  VIII: hearing is intact to voice X: Uvula elevates symmetrically XI: Shoulder shrug is symmetric. XII: tongue is midline without atrophy or fasciculations.  Motor: Tone is normal. Bulk is normal. 5/5 strength was present in all four extremities.  Sensory: Sensation is symmetric to light touch and temperature in the arms and legs. Cerebellar: FNF and HKS are intact bilaterally   I have reviewed labs in epic and the results pertinent to this consultation are: Creatinine 1.0  I have reviewed the images obtained: CT/CTA-negative  Impression: 67 year old female with transient aphasia in the setting of multiple stroke risk factors.  Her unilateral photophobic headache does sound migrainous in nature, but I would be hesitant to attribute her transient neurological symptoms to complicated migraine given that there was no positive symptom, she does not have any history of similar complicated migraines, and indeed does not even have a history  of migraines without aura.  In this setting, I would favor treating this as a transient ischemic attack and admitting for workup of such.  Recommendations: - HgbA1c, fasting lipid panel - MRI of the brain without contrast - Frequent neuro checks - Echocardiogram - Prophylactic therapy-Antiplatelet med: Aspirin - dose 81mg  and plavix 75mg  daily  after 300mg  load  - Risk factor modification - Telemetry monitoring - PT consult, OT consult, Speech consult - Stroke team to follow    Ritta Slot, MD [facility] [phone]  If 7pm- 7am, please page neurology on call as listed in AMION.

## 2022-12-13 NOTE — Code Documentation (Signed)
Stroke Response Nurse Documentation Code Documentation  Karina Pierce is a 67 y.o. female arriving to [facility]  via [employer] [facility] on 6/15 with past medical hx of DM, HLD, MI. On aspirin 81 mg daily and clopidogrel 75 mg daily. Code stroke was activated by ED.   Patient from home where she was LKW at 1830 and now complaining of aphasia.   Stroke team at the bedside on patient arrival. Labs drawn and patient cleared for CT by Dr. Rhunette Croft. Patient to CT with team. NIHSS 0, see documentation for details and code stroke times. Patient with no focal deficits on exam. The following imaging was completed:  CT Head and CTA. Patient is not a candidate for IV Thrombolytic due to resolving symptoms. Patient is not a candidate for IR due to No LVO.    Bedside handoff with ED RN [place].    Rose Fillers  Rapid Response RN

## 2022-12-13 NOTE — ED Provider Notes (Signed)
[place] EMERGENCY DEPARTMENT AT [facility] Provider Note   CSN: [id number] Arrival date & time: 12/13/22  1841     History  Chief Complaint  Patient presents with   Stroke s/s    Karina Pierce is a 67 y.o. female.  HPI     67 year old female comes in with chief complaint of difficulty with speech.  Last known normal 45 minutes or hour ago. Patient has history of diabetes, hypertension, NSTEMI, hyperlipidemia.  Patient is having sudden onset severe headache about 45 minutes to an hour ago.  Associated with that was difficulty in speaking.  Patient states that she cannot express herself.  Daughter is at the bedside, states that her mom was having difficulty getting the words out, and when she did she was not making sense.  There was no slurring.  Patient denies any one-sided weakness or numbness.  No history of stroke.  No history of headaches.   Home Medications Prior to Admission medications   Medication Sig Start Date End Date Taking? Authorizing Provider  acetaminophen (TYLENOL) 500 MG tablet Take 1 tablet (500 mg total) by mouth every 6 (six) hours as needed. Patient taking differently: Take 500 mg by mouth every 6 (six) hours as needed for moderate pain. 10/18/20   Lorelee New, PA-C  aspirin EC 81 MG tablet Take 1 tablet (81 mg total) by mouth daily. Swallow whole. 12/03/21   Arty Baumgartner, NP  clopidogrel (PLAVIX) 75 MG tablet Take 1 tablet (75 mg total) by mouth daily. 07/03/22   Rollene Rotunda, MD  escitalopram (LEXAPRO) 10 MG tablet Take 10 mg by mouth daily. 03/02/22   [provider]  FARXIGA 10 MG TABS tablet Take 10 mg by mouth daily. 02/20/22   [provider]  glipiZIDE (GLUCOTROL XL) 10 MG 24 hr tablet Take 10 mg by mouth daily with breakfast.    [provider]  metFORMIN (GLUCOPHAGE) 500 MG tablet Take 500 mg by mouth 2 (two) times daily. 03/02/22   [provider]  metoprolol tartrate (LOPRESSOR) 25 MG tablet  TAKE ONE-HALF TABLET BY MOUTH  TWICE DAILY 04/17/22   Rollene Rotunda, MD  MOUNJARO 2.5 MG/0.5ML Pen Inject 2.5 mg into the skin once a week. 09/11/22   [provider]  nitroGLYCERIN (NITROSTAT) 0.4 MG SL tablet Place 1 tablet (0.4 mg total) under the tongue every 5 (five) minutes x 3 doses as needed for chest pain. 10/09/22   Joylene Grapes, NP  oxyCODONE-acetaminophen (PERCOCET) 5-325 MG tablet Take 1 tablet by mouth every 4 (four) hours as needed. 12/06/21   Pricilla Loveless, MD  rosuvastatin (CRESTOR) 40 MG tablet Take 1 tablet (40 mg total) by mouth daily at 6 PM. Patient taking differently: Take 20 mg by mouth daily at 6 PM. Take half 0.5 Tablet Daily in The Evening 12/03/21   Arty Baumgartner, NP  traMADol (ULTRAM) 50 MG tablet Take 50 mg by mouth every 6 (six) hours as needed for moderate pain. 07/25/22   [provider]      Allergies    Patient has no known allergies.    Review of Systems   Review of Systems  All other systems reviewed and are negative.   Physical Exam Updated Vital Signs BP (!) 161/76   Pulse 71   Temp 97.9 F (36.6 C) (Oral)   Resp 13   SpO2 97%  Physical Exam Constitutional:      Appearance: She is well-developed.  HENT:  Head: Normocephalic and atraumatic.  Eyes:     Extraocular Movements: Extraocular movements intact.     Pupils: Pupils are equal, round, and reactive to light.  Cardiovascular:     Rate and Rhythm: Normal rate and regular rhythm.     Heart sounds: Normal heart sounds. No murmur heard. Pulmonary:     Effort: Pulmonary effort is normal. No respiratory distress.  Abdominal:     Palpations: Abdomen is soft.  Musculoskeletal:     Cervical back: Neck supple.  Skin:    General: Skin is warm and dry.  Neurological:     Mental Status: She is alert and oriented to person, place, and time.     Comments: Cerebellar exam is normal (finger to nose) Sensory exam normal for bilateral upper and lower extremities - and  patient is able to discriminate between sharp and dull. Motor exam is 4+/5      ED Results / Procedures / Treatments   Labs (all labs ordered are listed, but only abnormal results are displayed) Labs Reviewed  CBC - Abnormal; Notable for the following components:      Result Value   Platelets 403 (*)    All other components within normal limits  COMPREHENSIVE METABOLIC PANEL - Abnormal; Notable for the following components:   Glucose, Bld 181 (*)    Creatinine, Ser 1.04 (*)    Albumin 3.3 (*)    GFR, Estimated 60 (*)    All other components within normal limits  CBG MONITORING, ED - Abnormal; Notable for the following components:   Glucose-Capillary 162 (*)    All other components within normal limits  I-STAT CHEM 8, ED - Abnormal; Notable for the following components:   Glucose, Bld 182 (*)    All other components within normal limits  PROTIME-INR  APTT  DIFFERENTIAL  ETHANOL  RAPID URINE DRUG SCREEN, HOSP PERFORMED  URINALYSIS, ROUTINE W REFLEX MICROSCOPIC  CBC WITH DIFFERENTIAL/PLATELET  COMPREHENSIVE METABOLIC PANEL  MAGNESIUM  MAGNESIUM  LIPID PANEL  HEMOGLOBIN A1C    EKG EKG Interpretation  Date/Time:  Saturday December 13 2022 18:49:48 EDT Ventricular Rate:  67 PR Interval:  135 QRS Duration: 91 QT Interval:  383 QTC Calculation: 405 R Axis:   48 Text Interpretation: Sinus rhythm Borderline T abnormalities, diffuse leads No acute changes No significant change since last tracing Confirmed by Derwood Kaplan [phone]) on 12/13/2022 8:14:35 PM  Radiology CT ANGIO HEAD NECK W WO CM (CODE STROKE)  Result Date: 12/13/2022 CLINICAL DATA:  Stroke suspected EXAM: CT ANGIOGRAPHY HEAD AND NECK WITH AND WITHOUT CONTRAST TECHNIQUE: Multidetector CT imaging of the head and neck was performed using the standard protocol during bolus administration of intravenous contrast. Multiplanar CT image reconstructions and MIPs were obtained to evaluate the vascular anatomy. Carotid  stenosis measurements (when applicable) are obtained utilizing NASCET criteria, using the distal internal carotid diameter as the denominator. RADIATION DOSE REDUCTION: This exam was performed according to the departmental dose-optimization program which includes automated exposure control, adjustment of the mA and/or kV according to patient size and/or use of iterative reconstruction technique. CONTRAST:  75mL OMNIPAQUE IOHEXOL 350 MG/ML SOLN COMPARISON:  No prior CTA available, correlation is made with same day CT head FINDINGS: CT HEAD FINDINGS For noncontrast findings, please see same day CT head. CTA NECK FINDINGS Aortic arch: Standard branching. Imaged portion shows no evidence of aneurysm or dissection. No significant stenosis of the major arch vessel origins. Right carotid system: No evidence of dissection, occlusion, or hemodynamically  significant stenosis (greater than 50%). Atherosclerotic disease at the bifurcation and in the proximal ICA is not hemodynamically significant. Left carotid system: No evidence of dissection, occlusion, or hemodynamically significant stenosis (greater than 50%). Atherosclerotic disease at the bifurcation and in the proximal ICA is not hemodynamically significant. Vertebral arteries: No evidence of dissection, occlusion, or hemodynamically significant stenosis (greater than 50%). Skeleton: No acute osseous abnormality. Degenerative changes in the cervical spine. Other neck: No acute finding. Upper chest: No focal pulmonary opacity or pleural effusion. Review of the MIP images confirms the above findings CTA HEAD FINDINGS Anterior circulation: Both internal carotid arteries are patent to the termini, with mild stenosis in the distal right cavernous ICA. A1 segments patent. Azygous A2. Anterior cerebral arteries are patent to their distal aspects without significant stenosis. No M1 stenosis or occlusion. MCA branches perfused to their distal aspects without significant stenosis.  Posterior circulation: Vertebral arteries patent to the vertebrobasilar junction without significant stenosis. Posterior inferior cerebellar arteries patent proximally. Basilar patent to its distal aspect without significant stenosis. Superior cerebellar arteries patent proximally. Patent P1 segments. PCAs perfused to their distal aspects without significant stenosis. The bilateral posterior communicating arteries are not visualized. Venous sinuses: As permitted by contrast timing, patent. Anatomic variants: None significant. Review of the MIP images confirms the above findings IMPRESSION: 1. No intracranial large vessel occlusion. 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 2. No hemodynamically significant stenosis in the neck. Imaging results were communicated on 12/13/2022 at 7:28 pm to provider Dr. Amada Jupiter via secure text paging. Electronically Signed   By: Wiliam Ke M.D.   On: 12/13/2022 19:28   CT HEAD CODE STROKE WO CONTRAST  Result Date: 12/13/2022 CLINICAL DATA:  Code stroke. EXAM: CT HEAD WITHOUT CONTRAST TECHNIQUE: Contiguous axial images were obtained from the base of the skull through the vertex without intravenous contrast. RADIATION DOSE REDUCTION: This exam was performed according to the departmental dose-optimization program which includes automated exposure control, adjustment of the mA and/or kV according to patient size and/or use of iterative reconstruction technique. COMPARISON:  11/30/2021 FINDINGS: Brain: Hypodensity in the right caudate head (series 2, image 22) is new from the prior CT head but appears remote. No evidence of acute cortical infarction, hemorrhage, mass, mass effect, or midline shift. No hydrocephalus or extra-axial collection. Vascular: No hyperdense vessel. Skull: Negative for fracture or focal lesion. Sinuses/Orbits: No acute finding. Redemonstrated hyperdense left globe. Status post right lens replacement Other: The mastoid air cells are well aerated.  ASPECTS [facility] Stroke Program Early CT Score) - Ganglionic level infarction (caudate, lentiform nuclei, internal capsule, insula, M1-M3 cortex): 7 - Supraganglionic infarction (M4-M6 cortex): 3 Total score (0-10 with 10 being normal): 10 IMPRESSION: 1. No acute intracranial process. ASPECTS is 10. 2. 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 Amada Jupiter via secure text paging. Electronically Signed   By: Wiliam Ke M.D.   On: 12/13/2022 19:17    Procedures Procedures    Medications Ordered in ED Medications  aspirin chewable tablet 162 mg (has no administration in time range)  acetaminophen (TYLENOL) tablet 650 mg (has no administration in time range)    Or  acetaminophen (TYLENOL) suppository 650 mg (has no administration in time range)  melatonin tablet 3 mg (has no administration in time range)  ondansetron (ZOFRAN) injection 4 mg (has no administration in time  range)   stroke: early stages of recovery book (has no administration in time range)  hydrALAZINE (APRESOLINE) injection 10 mg (has no administration in time range)  iohexol (OMNIPAQUE) 350 MG/ML injection 75 mL (75 mLs Intravenous Contrast Given 12/13/22 1912)  prochlorperazine (COMPAZINE) injection 10 mg (10 mg Intravenous Given 12/13/22 2022)  diphenhydrAMINE (BENADRYL) injection 25 mg (25 mg Intravenous Given 12/13/22 2022)  ketorolac (TORADOL) 15 MG/ML injection 15 mg (15 mg Intravenous Given 12/13/22 2021)  sodium chloride 0.9 % bolus 500 mL (500 mLs Intravenous New Bag/Given 12/13/22 2020)    ED Course/ Medical Decision Making/ A&P       ABCD2 Score: 5                     Medical Decision Making Amount and/or Complexity of Data Reviewed Labs: ordered. Radiology: ordered.  Risk OTC drugs. Prescription drug management. Decision regarding hospitalization.  This patient  presents to the ED with chief complaint(s) of aphasia with pertinent past medical history of hypertension, hyperlipidemia, CAD, diabetes.The complaint involves an extensive differential diagnosis and also carries with it a high risk of complications and morbidity.    Patient's neuroexam completely nonfocal at this time, NIH stroke scale is 0.  She still is having severe headache.  Given the severity of symptoms, and the risk factors, will also activate code stroke so that patient can get prompt CT scan and also neurologic evaluation.  The differential diagnosis includes : Acute ischemic stroke, acute subarachnoid hemorrhage, TIA, complex migraine, severe electrolyte abnormality, TTP.   The initial plan is to get CT scan of the brain along with code stroke activation.   Additional history obtained: Additional history obtained from family Records reviewed  previous encounters with cardiology, previous echocardiogram and medications  Independent labs interpretation:  The following labs were independently interpreted: Patient has elevated platelet, otherwise labs are reassuring.  Independent visualization and interpretation of imaging: - I independently visualized the following imaging with scope of interpretation limited to determining acute life threatening conditions related to emergency care: CT scan of the brain, which revealed no evidence of brain bleed.  Since patient presented within 45 minutes of her headache and had she had CT completed within 2 hours of presentation of the headache -negative CT head effectively rule out subarachnoid hemorrhage.  Treatment and Reassessment: Patient continues to have no new changes.  Consultation: - Consulted or discussed management/test interpretation with external professional: Neurology service, Dr. Onalee Hua suspects that complex migraine is a possibility, but given patient's age, no previous history of similar symptoms and the  comorbidities he recommends admission to the hospital for TIA.   Final Clinical Impression(s) / ED Diagnoses Final diagnoses:  TIA (transient ischemic attack)  Severe headache    Rx / DC Orders ED Discharge Orders     None         Derwood Kaplan, MD 12/13/22 2030

## 2022-12-13 NOTE — ED Notes (Signed)
ED TO INPATIENT HANDOFF REPORT  ED Nurse Name and Phone #: [place] RN [phone]  Pt arrived POV after sudden onset headache, aphasia. Speech clear at time of arrival, NIH 0. Continues to endorse severe headache, photosensitivity. GCS 15, Room air, no neuro deficits noted at this time. 18g LAC.  S Name/Age/Gender Karina Pierce 67 y.o. female Room/Bed: 009C/009C  Code Status   Code Status: Full Code  Home/SNF/Other Home Patient oriented to: self, place, time, and situation Is this baseline? Yes   Triage Complete: Triage complete  Chief Complaint TIA (transient ischemic attack) [G45.9]  Triage Note Pt came in via POV d/t Stroke-Like s/s that started 1 hr ago, last LKN 1745. She had sudden onset HA & was having difficulty speaking the correct words. A/Ox4, speech clear while in triage.    Allergies No Known Allergies  Level of Care/Admitting Diagnosis ED Disposition     ED Disposition  Admit   Condition  --   Comment  Hospital Area: [facility] [[id number]]  Level of Care: Telemetry Medical [104]  May place patient in observation at [facility] or [facility] if equivalent level of care is available:: No  Covid Evaluation: Asymptomatic - no recent exposure (last 10 days) testing not required  Diagnosis: TIA (transient ischemic attack) [454098]  Admitting Physician: Angie Fava [[id number]]  Attending Physician: Angie Fava [[id number]]          B Medical/Surgery History Past Medical History:  Diagnosis Date   Diabetes mellitus without complication (HCC)    Hyperlipidemia    Past Surgical History:  Procedure Laterality Date   CARDIAC CATHETERIZATION     CORONARY STENT INTERVENTION N/A 12/02/2021   Procedure: CORONARY STENT INTERVENTION;  Surgeon: Runell Gess, MD;  Location: [facility] INVASIVE CV LAB;  Service: Cardiovascular;  Laterality: N/A;   KNEE SURGERY     LEFT HEART CATH AND CORONARY ANGIOGRAPHY N/A 12/02/2021   Procedure: LEFT HEART  CATH AND CORONARY ANGIOGRAPHY;  Surgeon: Runell Gess, MD;  Location: [facility] INVASIVE CV LAB;  Service: Cardiovascular;  Laterality: N/A;     A IV Location/Drains/Wounds Patient Lines/Drains/Airways Status     Active Line/Drains/Airways     Name Placement date Placement time Site Days   Peripheral IV 12/13/22 18 G Left Antecubital 12/13/22  1911  Antecubital  less than 1            Intake/Output Last 24 hours No intake or output data in the 24 hours ending 12/13/22 2028  Labs/Imaging Results for orders placed or performed during the hospital encounter of 12/13/22 (from the past 48 hour(s))  CBG monitoring, ED     Status: Abnormal   Collection Time: 12/13/22  6:47 PM  Result Value Ref Range   Glucose-Capillary 162 (H) 70 - 99 mg/dL    Comment: Glucose reference range applies only to samples taken after fasting for at least 8 hours.  Protime-INR     Status: None   Collection Time: 12/13/22  6:57 PM  Result Value Ref Range   Prothrombin Time 12.9 11.4 - 15.2 seconds   INR 1.0 0.8 - 1.2    Comment: (NOTE) INR goal varies based on device and disease states. Performed at [facility], [address] [place], [place] [address]   APTT     Status: None   Collection Time: 12/13/22  6:57 PM  Result Value Ref Range   aPTT 32 24 - 36 seconds    Comment: Performed at [facility]  [facility], [address], [place], [place] [address]  CBC     Status: Abnormal   Collection Time: 12/13/22  6:57 PM  Result Value Ref Range   WBC 6.5 4.0 - 10.5 K/uL   RBC 4.67 3.87 - 5.11 MIL/uL   Hemoglobin 12.3 12.0 - 15.0 g/dL   HCT 60.4 54.0 - 98.1 %   MCV 83.1 80.0 - 100.0 fL   MCH 26.3 26.0 - 34.0 pg   MCHC 31.7 30.0 - 36.0 g/dL   RDW 19.1 47.8 - 29.5 %   Platelets 403 (H) 150 - 400 K/uL   nRBC 0.0 0.0 - 0.2 %    Comment: Performed at [facility], [address], [place], [place] [address]  Differential     Status: None   Collection Time: 12/13/22  6:57 PM  Result Value Ref  Range   Neutrophils Relative % 48 %   Neutro Abs 3.2 1.7 - 7.7 K/uL   Lymphocytes Relative 41 %   Lymphs Abs 2.7 0.7 - 4.0 K/uL   Monocytes Relative 9 %   Monocytes Absolute 0.6 0.1 - 1.0 K/uL   Eosinophils Relative 1 %   Eosinophils Absolute 0.1 0.0 - 0.5 K/uL   Basophils Relative 1 %   Basophils Absolute 0.0 0.0 - 0.1 K/uL   Immature Granulocytes 0 %   Abs Immature Granulocytes 0.01 0.00 - 0.07 K/uL    Comment: Performed at [facility], [address], [place], [place] [address]  Comprehensive metabolic panel     Status: Abnormal   Collection Time: 12/13/22  6:57 PM  Result Value Ref Range   Sodium 136 135 - 145 mmol/L   Potassium 4.1 3.5 - 5.1 mmol/L   Chloride 104 98 - 111 mmol/L   CO2 24 22 - 32 mmol/L   Glucose, Bld 181 (H) 70 - 99 mg/dL    Comment: Glucose reference range applies only to samples taken after fasting for at least 8 hours.   BUN 12 8 - 23 mg/dL   Creatinine, Ser 4.69 (H) 0.44 - 1.00 mg/dL   Calcium 9.7 8.9 - 62.9 mg/dL   Total Protein 6.7 6.5 - 8.1 g/dL   Albumin 3.3 (L) 3.5 - 5.0 g/dL   AST 15 15 - 41 U/L   ALT 15 0 - 44 U/L   Alkaline Phosphatase 85 38 - 126 U/L   Total Bilirubin 0.9 0.3 - 1.2 mg/dL   GFR, Estimated 60 (L) >60 mL/min    Comment: (NOTE) Calculated using the CKD-EPI Creatinine Equation (2021)    Anion gap 8 5 - 15    Comment: Performed at [facility], [address], [place], [place] [address]  Ethanol     Status: None   Collection Time: 12/13/22  6:57 PM  Result Value Ref Range   Alcohol, Ethyl (B) <10 <10 mg/dL    Comment: (NOTE) Lowest detectable limit for serum alcohol is 10 mg/dL.  For medical purposes only. Performed at [facility], [address], [place], [place] [address]   I-stat chem 8, ED     Status: Abnormal   Collection Time: 12/13/22  7:13 PM  Result Value Ref Range   Sodium 139 135 - 145 mmol/L   Potassium 4.2 3.5 - 5.1 mmol/L   Chloride 106 98 - 111 mmol/L   BUN 15 8 - 23 mg/dL    Creatinine, Ser 1.02 0.44 - 1.00 mg/dL   Glucose, Bld  182 (H) 70 - 99 mg/dL    Comment: Glucose reference range applies only to samples taken after fasting for at least 8 hours.   Calcium, Ion 1.28 1.15 - 1.40 mmol/L   TCO2 27 22 - 32 mmol/L   Hemoglobin 13.3 12.0 - 15.0 g/dL   HCT 16.1 09.6 - 04.5 %   CT ANGIO HEAD NECK W WO CM (CODE STROKE)  Result Date: 12/13/2022 CLINICAL DATA:  Stroke suspected EXAM: CT ANGIOGRAPHY HEAD AND NECK WITH AND WITHOUT CONTRAST TECHNIQUE: Multidetector CT imaging of the head and neck was performed using the standard protocol during bolus administration of intravenous contrast. Multiplanar CT image reconstructions and MIPs were obtained to evaluate the vascular anatomy. Carotid stenosis measurements (when applicable) are obtained utilizing NASCET criteria, using the distal internal carotid diameter as the denominator. RADIATION DOSE REDUCTION: This exam was performed according to the departmental dose-optimization program which includes automated exposure control, adjustment of the mA and/or kV according to patient size and/or use of iterative reconstruction technique. CONTRAST:  75mL OMNIPAQUE IOHEXOL 350 MG/ML SOLN COMPARISON:  No prior CTA available, correlation is made with same day CT head FINDINGS: CT HEAD FINDINGS For noncontrast findings, please see same day CT head. CTA NECK FINDINGS Aortic arch: Standard branching. Imaged portion shows no evidence of aneurysm or dissection. No significant stenosis of the major arch vessel origins. Right carotid system: No evidence of dissection, occlusion, or hemodynamically significant stenosis (greater than 50%). Atherosclerotic disease at the bifurcation and in the proximal ICA is not hemodynamically significant. Left carotid system: No evidence of dissection, occlusion, or hemodynamically significant stenosis (greater than 50%). Atherosclerotic disease at the bifurcation and in the proximal ICA is not hemodynamically  significant. Vertebral arteries: No evidence of dissection, occlusion, or hemodynamically significant stenosis (greater than 50%). Skeleton: No acute osseous abnormality. Degenerative changes in the cervical spine. Other neck: No acute finding. Upper chest: No focal pulmonary opacity or pleural effusion. Review of the MIP images confirms the above findings CTA HEAD FINDINGS Anterior circulation: Both internal carotid arteries are patent to the termini, with mild stenosis in the distal right cavernous ICA. A1 segments patent. Azygous A2. Anterior cerebral arteries are patent to their distal aspects without significant stenosis. No M1 stenosis or occlusion. MCA branches perfused to their distal aspects without significant stenosis. Posterior circulation: Vertebral arteries patent to the vertebrobasilar junction without significant stenosis. Posterior inferior cerebellar arteries patent proximally. Basilar patent to its distal aspect without significant stenosis. Superior cerebellar arteries patent proximally. Patent P1 segments. PCAs perfused to their distal aspects without significant stenosis. The bilateral posterior communicating arteries are not visualized. Venous sinuses: As permitted by contrast timing, patent. Anatomic variants: None significant. Review of the MIP images confirms the above findings IMPRESSION: 1. No intracranial large vessel occlusion. 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 2. No hemodynamically significant stenosis in the neck. Imaging results were communicated on 12/13/2022 at 7:28 pm to provider Dr. Amada Jupiter via secure text paging. Electronically Signed   By: Wiliam Ke M.D.   On: 12/13/2022 19:28   CT HEAD CODE STROKE WO CONTRAST  Result Date: 12/13/2022 CLINICAL DATA:  Code stroke. EXAM: CT HEAD WITHOUT CONTRAST TECHNIQUE: Contiguous axial images were obtained from the base of the skull through the vertex without intravenous contrast. RADIATION DOSE REDUCTION: This exam  was performed according to the departmental dose-optimization program which includes automated exposure control, adjustment of the mA and/or kV according to patient size and/or use of iterative reconstruction technique. COMPARISON:  11/30/2021 FINDINGS: Brain: Hypodensity in the right caudate head (series 2, image 22) is new from the prior CT head but appears remote. No evidence of acute cortical infarction, hemorrhage, mass, mass effect, or midline shift. No hydrocephalus or extra-axial collection. Vascular: No hyperdense vessel. Skull: Negative for fracture or focal lesion. Sinuses/Orbits: No acute finding. Redemonstrated hyperdense left globe. Status post right lens replacement Other: The mastoid air cells are well aerated. ASPECTS [facility] Stroke Program Early CT Score) - Ganglionic level infarction (caudate, lentiform nuclei, internal capsule, insula, M1-M3 cortex): 7 - Supraganglionic infarction (M4-M6 cortex): 3 Total score (0-10 with 10 being normal): 10 IMPRESSION: 1. No acute intracranial process. ASPECTS is 10. 2. 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 Amada Jupiter via secure text paging. Electronically Signed   By: Wiliam Ke M.D.   On: 12/13/2022 19:17    Pending Labs Unresulted Labs (From admission, onward)     Start     Ordered   12/14/22 0500  CBC with Differential/Platelet  Tomorrow morning,   R        12/13/22 2022   12/14/22 0500  Comprehensive metabolic panel  Tomorrow morning,   R        12/13/22 2022   12/14/22 0500  Magnesium  Tomorrow morning,   R        12/13/22 2022   12/14/22 0500  Lipid panel  (Labs)  Tomorrow morning,   R       Comments: Fasting    12/13/22 2025   12/13/22 2025  Hemoglobin A1c  (Labs)  Add-on,   AD       Comments: To assess prior glycemic control    12/13/22 2025   12/13/22 2023  Magnesium  Add-on,    AD        12/13/22 2022   12/13/22 1857  Urine rapid drug screen (hosp performed)  Once,   STAT        12/13/22 1857   12/13/22 1857  Urinalysis, Routine w reflex microscopic -Urine, Clean Catch  Once,   URGENT       Question:  Specimen Source  Answer:  Urine, Clean Catch   12/13/22 1857            Vitals/Pain Today's Vitals   12/13/22 1850 12/13/22 1900 12/13/22 1923 12/13/22 1950  BP:   (!) 161/76   Pulse: 68 80 71   Resp: 14 15 13    Temp:   97.9 F (36.6 C)   TempSrc:   Oral   SpO2: 97% 96% 97%   PainSc: 0-No pain   8     Isolation Precautions No active isolations  Medications Medications  aspirin chewable tablet 162 mg (has no administration in time range)  acetaminophen (TYLENOL) tablet 650 mg (has no administration in time range)    Or  acetaminophen (TYLENOL) suppository 650 mg (has no administration in time range)  melatonin tablet 3 mg (has no administration in time range)  ondansetron (ZOFRAN) injection 4 mg (has no administration in time range)   stroke: early stages of recovery book (has no administration in time range)  iohexol (OMNIPAQUE) 350 MG/ML injection 75 mL (75 mLs Intravenous Contrast Given 12/13/22 1912)  prochlorperazine (COMPAZINE) injection 10 mg (10 mg Intravenous Given 12/13/22 2022)  diphenhydrAMINE (BENADRYL) injection 25 mg (25 mg Intravenous Given 12/13/22 2022)  ketorolac (  TORADOL) 15 MG/ML injection 15 mg (15 mg Intravenous Given 12/13/22 2021)  sodium chloride 0.9 % bolus 500 mL (500 mLs Intravenous New Bag/Given 12/13/22 2020)    Mobility walks     Focused Assessments Neuro Assessment Handoff:  Swallow screen pass? Yes    NIH Stroke Scale  Dizziness Present: No Headache Present: Yes Interval: Initial Level of Consciousness (1a.)   : Alert, keenly responsive LOC Questions (1b. )   : Answers both questions correctly LOC Commands (1c. )   : Performs both tasks correctly Best Gaze (2. )  : Normal Visual (3. )  : No visual  loss Facial Palsy (4. )    : Normal symmetrical movements Motor Arm, Left (5a. )   : No drift Motor Arm, Right (5b. ) : No drift Motor Leg, Left (6a. )  : No drift Motor Leg, Right (6b. ) : No drift Limb Ataxia (7. ): Absent Sensory (8. )  : Normal, no sensory loss Best Language (9. )  : No aphasia Dysarthria (10. ): Normal Extinction/Inattention (11.)   : No Abnormality Complete NIHSS TOTAL: 0 Last date known well: 12/13/22 Last time known well: 1830 Neuro Assessment:   Neuro Checks:   Initial (12/13/22 1923)  Has TPA been given? No If patient is a Neuro Trauma and patient is going to OR before floor call report to 4N Charge nurse: [phone] or [phone]   R Recommendations: See Admitting Provider Note  Report given to:   Additional Notes: See top of SBAR

## 2022-12-13 NOTE — H&P (Signed)
History and Physical      Karina Pierce ZOX:[id number] DOB: 1956-01-21 DOA: 12/13/2022; DOS: 12/13/2022  PCP: Karina Able, MD  Patient coming from: home   I have personally briefly reviewed patient's old medical records in [facility]  Chief Complaint: Expressive aphasia  HPI: Karina Pierce is a 67 y.o. female with medical history significant for hyperlipidemia, type 2 diabetes mellitus, who is admitted to [facility] on 12/13/2022 with expressive aphasia after presenting from home to [place] ED complaining of expressive aphasia.   The following history is provided by the patient as well as her daughter, who is present at bedside, in addition to my discussions with the EDP and via chart review.  The patient reports sudden onset of expressive aphasia, 1745 on 12/13/2022, noting that she was Pierce to think of the words that she was to say, but that they were "coming out wrong" when attempting to verbalize them.  Dr. Conveys that the reports of the patient were speaking involves no slurring of speech, but were non-sensical.  She notes that this was associated with new onset headache, in the absence of any associated visual or olfactory aura or any scintillating cytoma.  Denies any associated acute focal weakness, paresthesias, numbness, dysphagia, dizziness, vertigo, nausea, vomiting, change in vision, blurry vision, diplopia, or facial droop.  She took 2 baby aspirin and was brought to [facility] emergency department for further evaluation and management thereof.   Shortly after arriving at [facility] emergency department, her speech returned to baseline, and she denies any residual acute focal neurologic deficits at this time.  Denies any associated chest pain, shortness of breath, palpitations, diaphoresis, presyncope, or syncope.  Denies any previous history of stroke. Medical/social history notable for hyperlipidemia, type 2 diabetes mellitus, while pt denies any known h/o hypertension,  paroxysmal atrial fibrillation.  She conveys that she is a former smoker.  She denies any known history of migraines.  Reports good compliance with outpatient rosuvastatin 20 mg p.o. daily.  On no antiplatelet or anticoagulation medications at the time of the stroke, including no aspirin.     ED Course:  Vital signs in the ED were notable for the following: Afebrile; heart rates in the 60s to 80s; systolic pressures in the 140s to 160s; respiratory rate 14-18, oxygen saturation 94 to 98% on room air.  Labs were notable for the following: CMP notable for the following: Sodium 136, creatinine 1.04, glucose 181, liver enzymes within normal limits.  Serum ethanol level less than 10.  CBC notable for white blood cell count 6500.  INR 1.0.  Urinalysis and urinary drug screen been ordered, with result currently pending.  Per my interpretation, EKG in ED demonstrated the following: Sinus rhythm with heart rate 67, normal intervals, nonspecific T wave inversion limited to V3, and no evidence of ST changes, including no evidence of ST elevation.  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  CTA head and neck, performed radiology read, showed no evidence of large vessel occlusion, hemodynamically significant stenosis, and or any evidence of dissection or aneurysm.  EDP discussed patient's case and imaging with the on-call neurologist, Dr. Amada Jupiter, who recommended admission to the hospitalist service for further evaluation/management of concern for TIA versus acute ischemic CVA vs complex migraine, as well as further assessment of potential modifiable ischemic CVA risk factors.  Neurology to  formally consult, with additional recommendations to follow.  While in the ED, the following were administered: Aspirin 162 mg p.o. x 1 dose,  Toradol 15 mg IV x 1, stop Compazine 10 mg IV x 1, normal saline x 500 cc bolus.  Subsequently, the patient was admitted for further evaluation management of presenting episode of expressive aphasia.     Review of Systems: As per HPI otherwise 10 point review of systems negative.   Past Medical History:  Diagnosis Date   Diabetes mellitus without complication (HCC)    Hyperlipidemia     Past Surgical History:  Procedure Laterality Date   CARDIAC CATHETERIZATION     CORONARY STENT INTERVENTION N/A 12/02/2021   Procedure: CORONARY STENT INTERVENTION;  Surgeon: Runell Gess, MD;  Location: [facility] INVASIVE CV LAB;  Service: Cardiovascular;  Laterality: N/A;   KNEE SURGERY     LEFT HEART CATH AND CORONARY ANGIOGRAPHY N/A 12/02/2021   Procedure: LEFT HEART CATH AND CORONARY ANGIOGRAPHY;  Surgeon: Runell Gess, MD;  Location: [facility] INVASIVE CV LAB;  Service: Cardiovascular;  Laterality: N/A;    Social History:  reports that she has quit smoking. She has never used smokeless tobacco. She reports that she does not drink alcohol and does not use drugs.   No Known Allergies  Family History  Problem Relation Age of Onset   CAD Mother        CABG at ~ 70 yo    Family history reviewed and not pertinent    Prior to Admission medications   Medication Sig Start Date End Date Taking? Authorizing Provider  acetaminophen (TYLENOL) 500 MG tablet Take 1 tablet (500 mg total) by mouth every 6 (six) hours as needed. Patient taking differently: Take 500 mg by mouth every 6 (six) hours as needed for moderate pain. 10/18/20   Lorelee New, PA-C  aspirin EC 81 MG tablet Take 1 tablet (81 mg total) by mouth daily. Swallow whole. 12/03/21   Arty Baumgartner, NP  clopidogrel (PLAVIX) 75 MG tablet Take 1 tablet (75 mg total) by mouth daily. 07/03/22   Rollene Rotunda, MD  escitalopram (LEXAPRO) 10 MG tablet Take 10 mg by mouth daily. 03/02/22   [provider]  FARXIGA 10 MG TABS tablet  Take 10 mg by mouth daily. 02/20/22   [provider]  glipiZIDE (GLUCOTROL XL) 10 MG 24 hr tablet Take 10 mg by mouth daily with breakfast.    [provider]  metFORMIN (GLUCOPHAGE) 500 MG tablet Take 500 mg by mouth 2 (two) times daily. 03/02/22   [provider]  metoprolol tartrate (LOPRESSOR) 25 MG tablet TAKE ONE-HALF TABLET BY MOUTH  TWICE DAILY 04/17/22   Rollene Rotunda, MD  MOUNJARO 2.5 MG/0.5ML Pen Inject 2.5 mg into the skin once a week. 09/11/22   [provider]  nitroGLYCERIN (NITROSTAT) 0.4 MG SL tablet Place 1 tablet (0.4 mg total) under the tongue every 5 (five) minutes x 3 doses as needed for chest pain. 10/09/22   Joylene Grapes, NP  oxyCODONE-acetaminophen (PERCOCET) 5-325 MG tablet Take 1 tablet by mouth every 4 (four) hours as needed. 12/06/21   Pricilla Loveless, MD  rosuvastatin (CRESTOR) 40 MG tablet Take 1 tablet (40 mg total) by mouth daily at 6 PM. Patient taking differently: Take 20 mg by mouth daily at 6 PM. Take half 0.5 Tablet Daily in The Evening 12/03/21   Arty Baumgartner, NP  traMADol (ULTRAM) 50 MG tablet Take 50 mg by mouth every  6 (six) hours as needed for moderate pain. 07/25/22   [provider]     Objective    Physical Exam: Vitals:   12/13/22 1846 12/13/22 1850 12/13/22 1900 12/13/22 1923  BP: 119/74   (!) 161/76  Pulse: 77 68 80 71  Resp: 15 14 15 13   Temp:    97.9 F (36.6 C)  TempSrc:    Oral  SpO2: 98% 97% 96% 97%    General: appears to be stated age; alert, oriented Skin: warm, dry, no rash Head:  AT/ Mouth:  Oral mucosa membranes appear moist, normal dentition Neck: supple; trachea midline Heart:  RRR; did not appreciate any M/R/G Lungs: CTAB, did not appreciate any wheezes, rales, or rhonchi Abdomen: + BS; soft, ND, NT Vascular: 2+ pedal pulses b/l; 2+ radial pulses b/l Extremities: no peripheral edema, no muscle wasting Neuro: 5/5 strength of the proximal and distal flexors and extensors  of the upper and lower extremities bilaterally; sensation intact in upper and lower extremities b/l; cranial nerves II through XII grossly intact; no pronator drift; no evidence suggestive of slurred speech, dysarthria, or facial droop; Normal muscle tone. No tremors.    Labs on Admission: I have personally reviewed following labs and imaging studies  CBC: Recent Labs  Lab 12/13/22 1857 12/13/22 1913  WBC 6.5  --   NEUTROABS 3.2  --   HGB 12.3 13.3  HCT 38.8 39.0  MCV 83.1  --   PLT 403*  --    Basic Metabolic Panel: Recent Labs  Lab 12/13/22 1857 12/13/22 1913  NA 136 139  K 4.1 4.2  CL 104 106  CO2 24  --   GLUCOSE 181* 182*  BUN 12 15  CREATININE 1.04* 1.00  CALCIUM 9.7  --    GFR: CrCl cannot be calculated (Unknown ideal weight.). Liver Function Tests: Recent Labs  Lab 12/13/22 1857  AST 15  ALT 15  ALKPHOS 85  BILITOT 0.9  PROT 6.7  ALBUMIN 3.3*   No results for input(s): "LIPASE", "AMYLASE" in the last 168 hours. No results for input(s): "AMMONIA" in the last 168 hours. Coagulation Profile: Recent Labs  Lab 12/13/22 1857  INR 1.0   Cardiac Enzymes: No results for input(s): "CKTOTAL", "CKMB", "CKMBINDEX", "TROPONINI" in the last 168 hours. BNP (last 3 results) No results for input(s): "PROBNP" in the last 8760 hours. HbA1C: No results for input(s): "HGBA1C" in the last 72 hours. CBG: Recent Labs  Lab 12/13/22 1847  GLUCAP 162*   Lipid Profile: No results for input(s): "CHOL", "HDL", "LDLCALC", "TRIG", "CHOLHDL", "LDLDIRECT" in the last 72 hours. Thyroid Function Tests: No results for input(s): "TSH", "T4TOTAL", "FREET4", "T3FREE", "THYROIDAB" in the last 72 hours. Anemia Panel: No results for input(s): "VITAMINB12", "FOLATE", "FERRITIN", "TIBC", "IRON", "RETICCTPCT" in the last 72 hours. Urine analysis:    Component Value Date/Time   COLORURINE YELLOW 06/07/2022 1811   APPEARANCEUR CLEAR 06/07/2022 1811   LABSPEC 1.009 06/07/2022 1811    PHURINE 5.0 06/07/2022 1811   GLUCOSEU 50 (A) 06/07/2022 1811   HGBUR NEGATIVE 06/07/2022 1811   BILIRUBINUR NEGATIVE 06/07/2022 1811   KETONESUR NEGATIVE 06/07/2022 1811   PROTEINUR 100 (A) 06/07/2022 1811   NITRITE NEGATIVE 06/07/2022 1811   LEUKOCYTESUR NEGATIVE 06/07/2022 1811    Radiological Exams on Admission: CT ANGIO HEAD NECK W WO CM (CODE STROKE)  Result Date: 12/13/2022 CLINICAL DATA:  Stroke suspected EXAM: CT ANGIOGRAPHY HEAD AND NECK WITH AND WITHOUT CONTRAST TECHNIQUE: Multidetector CT imaging of the head  and neck was performed using the standard protocol during bolus administration of intravenous contrast. Multiplanar CT image reconstructions and MIPs were obtained to evaluate the vascular anatomy. Carotid stenosis measurements (when applicable) are obtained utilizing NASCET criteria, using the distal internal carotid diameter as the denominator. RADIATION DOSE REDUCTION: This exam was performed according to the departmental dose-optimization program which includes automated exposure control, adjustment of the mA and/or kV according to patient size and/or use of iterative reconstruction technique. CONTRAST:  75mL OMNIPAQUE IOHEXOL 350 MG/ML SOLN COMPARISON:  No prior CTA available, correlation is made with same day CT head FINDINGS: CT HEAD FINDINGS For noncontrast findings, please see same day CT head. CTA NECK FINDINGS Aortic arch: Standard branching. Imaged portion shows no evidence of aneurysm or dissection. No significant stenosis of the major arch vessel origins. Right carotid system: No evidence of dissection, occlusion, or hemodynamically significant stenosis (greater than 50%). Atherosclerotic disease at the bifurcation and in the proximal ICA is not hemodynamically significant. Left carotid system: No evidence of dissection, occlusion, or hemodynamically significant stenosis (greater than 50%). Atherosclerotic disease at the bifurcation and in the proximal ICA is not  hemodynamically significant. Vertebral arteries: No evidence of dissection, occlusion, or hemodynamically significant stenosis (greater than 50%). Skeleton: No acute osseous abnormality. Degenerative changes in the cervical spine. Other neck: No acute finding. Upper chest: No focal pulmonary opacity or pleural effusion. Review of the MIP images confirms the above findings CTA HEAD FINDINGS Anterior circulation: Both internal carotid arteries are patent to the termini, with mild stenosis in the distal right cavernous ICA. A1 segments patent. Azygous A2. Anterior cerebral arteries are patent to their distal aspects without significant stenosis. No M1 stenosis or occlusion. MCA branches perfused to their distal aspects without significant stenosis. Posterior circulation: Vertebral arteries patent to the vertebrobasilar junction without significant stenosis. Posterior inferior cerebellar arteries patent proximally. Basilar patent to its distal aspect without significant stenosis. Superior cerebellar arteries patent proximally. Patent P1 segments. PCAs perfused to their distal aspects without significant stenosis. The bilateral posterior communicating arteries are not visualized. Venous sinuses: As permitted by contrast timing, patent. Anatomic variants: None significant. Review of the MIP images confirms the above findings IMPRESSION: 1. No intracranial large vessel occlusion. 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 2. No hemodynamically significant stenosis in the neck. Imaging results were communicated on 12/13/2022 at 7:28 pm to provider Dr. Amada Jupiter via secure text paging. Electronically Signed   By: Wiliam Ke M.D.   On: 12/13/2022 19:28   CT HEAD CODE STROKE WO CONTRAST  Result Date: 12/13/2022 CLINICAL DATA:  Code stroke. EXAM: CT HEAD WITHOUT CONTRAST TECHNIQUE: Contiguous axial images were obtained from the base of the skull through the vertex without intravenous contrast. RADIATION DOSE  REDUCTION: This exam was performed according to the departmental dose-optimization program which includes automated exposure control, adjustment of the mA and/or kV according to patient size and/or use of iterative reconstruction technique. COMPARISON:  11/30/2021 FINDINGS: Brain: Hypodensity in the right caudate head (series 2, image 22) is new from the prior CT head but appears remote. No evidence of acute cortical infarction, hemorrhage, mass, mass effect, or midline shift. No hydrocephalus or extra-axial collection. Vascular: No hyperdense vessel. Skull: Negative for fracture or focal lesion. Sinuses/Orbits: No acute finding. Redemonstrated hyperdense left globe. Status post right lens replacement Other: The mastoid air cells are well aerated. ASPECTS [facility] Stroke Program Early CT Score) - Ganglionic level infarction (caudate, lentiform nuclei, internal capsule, insula, M1-M3 cortex): 7 -  Supraganglionic infarction (M4-M6 cortex): 3 Total score (0-10 with 10 being normal): 10 IMPRESSION: 1. No acute intracranial process. ASPECTS is 10. 2. 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 Amada Jupiter via secure text paging. Electronically Signed   By: Wiliam Ke M.D.   On: 12/13/2022 19:17      Assessment/Plan   Principal Problem:   TIA (transient ischemic attack) Active Problems:   HLD (hyperlipidemia)   DM2 (diabetes mellitus, type 2) (HCC)   Expressive aphasia   Depression      #) Expressive aphasia: Acute episode of expressive aphasia that started acutely at approximately 1745 on 12/13/2022 and was associate with headache, with resolution spontaneously shortly after arriving in the emergency department, without any objective evidence of residual acute focal neurologic deficits.  CT head without evidence of acute process, CTA head and neck showed no  evidence of large vessel occlusion or hemodynamically significant stenosis.   EDP discussed patient's case and imaging with the on-call neurologist, Dr. Amada Jupiter, who recommended admission to the hospitalist service for further evaluation/management of concern for TIA versus acute ischemic CVA vs complex migraine, as well as further assessment of potential modifiable ischemic CVA risk factors.  Neurology to formally consult, with additional recommendations to follow. Will pursue MRI to further evaluate.  It is notable that the patient denies any known history of migraines, rendering the possibility of complex migraine to be less likely at this time.   The patient possesses multiple modifiable CVA risk factors including a history of hyperlipidemia, type 2 diabetes mellitus. EKG in ED today showed sinus rhythm without overt evidence of acute ischemic changes. Will perform further assessment of modifiable ischemic CVA risk factors, detailed below.     Not a candidate for TPA administration given that the patient's acute neurologic symptoms completely resolved and spontaneous fashion without any recurrence or residual acute focal neurologic deficits noted. Current outpatient antiplatelet/anticoagulant regimen: None. Will follow for neuro recommendations regarding antiplatelet therapy. Current outpatient anti-lipid regimen: Rosuvastatin 20 mg p.o. daily.   For now, Will allow for permissive hypertension for 24-48 hours following onset of acute focal of note, the patient has received a total of full dose aspirin today, as further detailed above.  Neurologic deficits, with associated parameters further quantified below.     Plan: Nursing bedside swallow evaluation x 1 now, and will not initiate oral medications or diet until the patient has passed this. Head of the bed at 30 degrees. Neuro checks per protocol. VS per protocol. Will allow for permissive hypertension for 24-48 hours, with prn IV hydralazine  ordered for SBP greater than 220 mmHg or DBP greater than 110 mmHg. Monitor on telemetry, including monitoring for atrial fibrillation as modifiable risk factor for acute ischemic CVA.   MRI brain.  If MRI brain positive for acute ischemic stroke, would then pursue TTE without  bubble study. Additionally, as component of evaluation of potential modifiable ischemic CVA risk factors, will also check lipid panel and A1c. PT/OT/ST consults have been ordered for the morning.  Neurology to formally consult, will follow further additional recommendations.  Follow-up result urinary drug screen.               #) Hyperlipidemia: documented h/o such. On high intensity rosuvastatin as outpatient.   Plan: continue home statin.  Follow for results of lipid panel, ordered as component  of evaluation of potential modifiable acute ischemic CVA risk factors, as above.                   #) Type 2 Diabetes Mellitus: documented history of such. Home insulin regimen: None. Home oral hypoglycemic agents: Farxiga, glipizide, metformin.  She is also on [facility] as an outpatient.  Presenting blood sugar: 181. Most recent A1c noted to be 8.6% when checked on 09/11/2022.  Plan: accuchecks QAC and HS with low dose SSI. hold home oral hypoglycemic agents as well as Mounjaro during this hospitalization.  Add on hemoglobin A1c level as evaluation for potential modifiable acute ischemic CVA risk factors, as above.                 #) Depression: documented h/o such. On Lexapro as outpatient.  Per presenting EKG, no evidence of QTc prolongation.  Plan: Resume home Lexapro.        DVT prophylaxis: SCD's   Code Status: Full code Family Communication: none Disposition Plan: Per Rounding Team Consults called: EDP d/w on-call neurology, Dr. Amada Jupiter, who will formally consult, as further detailed above;  Admission status: Observation     I SPENT GREATER THAN 75  MINUTES IN  CLINICAL CARE TIME/MEDICAL DECISION-MAKING IN COMPLETING THIS ADMISSION.      Chaney Born Alvino Lechuga DO [facility]  From 7PM - 7AM   12/13/2022, 8:32 PM

## 2022-12-13 NOTE — ED Notes (Signed)
Pt returned to room 9. Pt is alert and oriented, moves all extremities equally, endorses difficulty with speech and headache.

## 2022-12-13 NOTE — ED Triage Notes (Signed)
Pt came in via POV d/t Stroke-Like s/s that started 1 hr ago, last LKN 1745. She had sudden onset HA & was having difficulty speaking the correct words. A/Ox4, speech clear while in triage.

## 2022-12-13 NOTE — ED Notes (Signed)
Called to activate a code stoke per doc request

## 2022-12-14 ENCOUNTER — Observation Stay (HOSPITAL_BASED_OUTPATIENT_CLINIC_OR_DEPARTMENT_OTHER): Payer: 59

## 2022-12-14 DIAGNOSIS — I639 Cerebral infarction, unspecified: Secondary | ICD-10-CM

## 2022-12-14 DIAGNOSIS — I6389 Other cerebral infarction: Secondary | ICD-10-CM

## 2022-12-14 DIAGNOSIS — E669 Obesity, unspecified: Secondary | ICD-10-CM | POA: Insufficient documentation

## 2022-12-14 LAB — CBC WITH DIFFERENTIAL/PLATELET
Abs Immature Granulocytes: 0.02 10*3/uL (ref 0.00–0.07)
Basophils Absolute: 0 10*3/uL (ref 0.0–0.1)
Basophils Relative: 1 %
Eosinophils Absolute: 0.1 10*3/uL (ref 0.0–0.5)
Eosinophils Relative: 2 %
HCT: 36.8 % (ref 36.0–46.0)
Hemoglobin: 11.8 g/dL — ABNORMAL LOW (ref 12.0–15.0)
Immature Granulocytes: 0 %
Lymphocytes Relative: 57 %
Lymphs Abs: 4 10*3/uL (ref 0.7–4.0)
MCH: 26.5 pg (ref 26.0–34.0)
MCHC: 32.1 g/dL (ref 30.0–36.0)
MCV: 82.5 fL (ref 80.0–100.0)
Monocytes Absolute: 0.7 10*3/uL (ref 0.1–1.0)
Monocytes Relative: 9 %
Neutro Abs: 2.2 10*3/uL (ref 1.7–7.7)
Neutrophils Relative %: 31 %
Platelets: 363 10*3/uL (ref 150–400)
RBC: 4.46 MIL/uL (ref 3.87–5.11)
RDW: 12.7 % (ref 11.5–15.5)
WBC: 7 10*3/uL (ref 4.0–10.5)
nRBC: 0 % (ref 0.0–0.2)

## 2022-12-14 LAB — LIPID PANEL
Cholesterol: 233 mg/dL — ABNORMAL HIGH (ref 0–200)
HDL: 46 mg/dL (ref >40)
LDL Cholesterol: 158 mg/dL — ABNORMAL HIGH (ref 0–99)
Total CHOL/HDL Ratio: 5.1 RATIO
Triglycerides: 145 mg/dL (ref <150)
VLDL: 29 mg/dL (ref 0–40)

## 2022-12-14 LAB — COMPREHENSIVE METABOLIC PANEL
ALT: 13 U/L (ref 0–44)
AST: 14 U/L — ABNORMAL LOW (ref 15–41)
Albumin: 2.9 g/dL — ABNORMAL LOW (ref 3.5–5.0)
Alkaline Phosphatase: 77 U/L (ref 38–126)
Anion gap: 7 (ref 5–15)
BUN: 14 mg/dL (ref 8–23)
CO2: 25 mmol/L (ref 22–32)
Calcium: 8.8 mg/dL — ABNORMAL LOW (ref 8.9–10.3)
Chloride: 103 mmol/L (ref 98–111)
Creatinine, Ser: 0.96 mg/dL (ref 0.44–1.00)
GFR, Estimated: >60 mL/min (ref >60)
Glucose, Bld: 195 mg/dL — ABNORMAL HIGH (ref 70–99)
Potassium: 3.7 mmol/L (ref 3.5–5.1)
Sodium: 135 mmol/L (ref 135–145)
Total Bilirubin: 0.7 mg/dL (ref 0.3–1.2)
Total Protein: 5.8 g/dL — ABNORMAL LOW (ref 6.5–8.1)

## 2022-12-14 LAB — GLUCOSE, CAPILLARY
Glucose-Capillary: 172 mg/dL — ABNORMAL HIGH (ref 70–99)
Glucose-Capillary: 202 mg/dL — ABNORMAL HIGH (ref 70–99)
Glucose-Capillary: 195 mg/dL — ABNORMAL HIGH (ref 70–99)

## 2022-12-14 LAB — ECHOCARDIOGRAM COMPLETE BUBBLE STUDY
Area-P 1/2: 4.86 cm2
P 1/2 time: 623 msec
S' Lateral: 3.1 cm

## 2022-12-14 LAB — MAGNESIUM: Magnesium: 1.9 mg/dL (ref 1.7–2.4)

## 2022-12-14 MED ORDER — ROSUVASTATIN CALCIUM 40 MG PO TABS: 40.0000 mg | ORAL_TABLET | Freq: Every day | ORAL | 3 refills | Status: DC

## 2022-12-14 MED ORDER — GLIPIZIDE ER 10 MG PO TB24: 10.0000 mg | ORAL_TABLET | Freq: Every day | ORAL | 3 refills | Status: AC

## 2022-12-14 MED ORDER — METFORMIN HCL 500 MG PO TABS: 500.0000 mg | ORAL_TABLET | ORAL | 3 refills | Status: AC

## 2022-12-14 MED ORDER — CLOPIDOGREL BISULFATE 75 MG PO TABS: 75.0000 mg | ORAL_TABLET | Freq: Every day | ORAL | 0 refills | Status: DC

## 2022-12-14 MED ORDER — ASPIRIN 81 MG PO TBEC: 81.0000 mg | DELAYED_RELEASE_TABLET | Freq: Every day | ORAL | 11 refills | Status: DC

## 2022-12-14 MED ORDER — FARXIGA 10 MG PO TABS: 10.0000 mg | ORAL_TABLET | Freq: Every day | ORAL | 3 refills | Status: AC

## 2022-12-14 NOTE — Hospital Course (Addendum)
Karina Pierce is a 67 y.o. F with obesity, DM, CAD, HTN, and HLD who presented with sudden onset aphasia.  This resolved by arrival to the ER.  MRI brain showed small right frontal infarct.

## 2022-12-14 NOTE — Progress Notes (Addendum)
STROKE TEAM PROGRESS NOTE   INTERVAL HISTORY Her daughter is at the bedside.  Sitting in the bed in no apparent distress.  She states yesterday she developed a headache and had trouble getting the correct words out she knew exactly what she wanted to say but could not say them. MRI brain was obtained and revealed small acute infarct in right frontal lobe  Vitals:   12/13/22 2300 12/13/22 2300 12/14/22 0519 12/14/22 0924  BP: (!) 140/72 (!) 140/68 110/60 115/60  Pulse: 68 71 67 67  Resp: 18 18 16 16   Temp: 98.4 F (36.9 C) 98.4 F (36.9 C) 98 F (36.7 C) 97.9 F (36.6 C)  TempSrc: Oral Oral Oral Oral  SpO2: 100% 100% 97% 100%   CBC:  Recent Labs  Lab 12/13/22 1857 12/13/22 1913 12/14/22 0405  WBC 6.5  --  7.0  NEUTROABS 3.2  --  2.2  HGB 12.3 13.3 11.8*  HCT 38.8 39.0 36.8  MCV 83.1  --  82.5  PLT 403*  --  363   Basic Metabolic Panel:  Recent Labs  Lab 12/13/22 1857 12/13/22 1913 12/14/22 0405  NA 136 139 135  K 4.1 4.2 3.7  CL 104 106 103  CO2 24  --  25  GLUCOSE 181* 182* 195*  BUN 12 15 14   CREATININE 1.04* 1.00 0.96  CALCIUM 9.7  --  8.8*  MG 1.9  --  1.9   Lipid Panel:  Recent Labs  Lab 12/14/22 0405  CHOL 233*  TRIG 145  HDL 46  CHOLHDL 5.1  VLDL 29  LDLCALC 161*   HgbA1c:  Recent Labs  Lab 12/13/22 1857  HGBA1C 9.0*   Urine Drug Screen: No results for input(s): "LABOPIA", "COCAINSCRNUR", "LABBENZ", "AMPHETMU", "THCU", "LABBARB" in the last 168 hours.  Alcohol Level  Recent Labs  Lab 12/13/22 1857  ETH <10    IMAGING past 24 hours MR BRAIN WO CONTRAST  Result Date: 12/13/2022 CLINICAL DATA:  Acute onset expressive aphasia, now resolved; transient ischemic attack EXAM: MRI HEAD WITHOUT CONTRAST TECHNIQUE: Multiplanar, multiecho pulse sequences of the brain and surrounding structures were obtained without intravenous contrast. COMPARISON:  No prior MRI available, correlation is made with CT head 12/13/2022 FINDINGS: Brain: Small focus of  restricted diffusion with ADC correlate in the right frontal cortex and subcortical white matter (series 5, images 79-80), which measures up to 10 mm. This areas is not associated with increased T2 hyperintense signal and is favored to represent an acute infarct. No acute hemorrhage, mass, mass effect, or midline shift. No hydrocephalus or extra-axial collection. Remote lacunar infarct in the right caudate. Scattered T2 hyperintense signal in the periventricular white matter, likely the sequela of minimal chronic small vessel ischemic disease. Vascular: Normal arterial flow voids. Skull and upper cervical spine: Normal marrow signal. Sinuses/Orbits: Clear paranasal sinuses. Status post right lens replacement. Postprocedural findings in the left globe. Other: Trace fluid in left mastoid air cells. IMPRESSION: Small acute infarct in the right frontal cortex and subcortical white matter. Imaging results were communicated on 12/13/2022 at 10:12 pm to provider Dr. Amada Jupiter via secure text paging. Electronically Signed   By: Wiliam Ke M.D.   On: 12/13/2022 22:13   CT ANGIO HEAD NECK W WO CM (CODE STROKE)  Result Date: 12/13/2022 CLINICAL DATA:  Stroke suspected EXAM: CT ANGIOGRAPHY HEAD AND NECK WITH AND WITHOUT CONTRAST TECHNIQUE: Multidetector CT imaging of the head and neck was performed using the standard protocol during bolus administration of intravenous  contrast. Multiplanar CT image reconstructions and MIPs were obtained to evaluate the vascular anatomy. Carotid stenosis measurements (when applicable) are obtained utilizing NASCET criteria, using the distal internal carotid diameter as the denominator. RADIATION DOSE REDUCTION: This exam was performed according to the departmental dose-optimization program which includes automated exposure control, adjustment of the mA and/or kV according to patient size and/or use of iterative reconstruction technique. CONTRAST:  75mL OMNIPAQUE IOHEXOL 350 MG/ML SOLN  COMPARISON:  No prior CTA available, correlation is made with same day CT head FINDINGS: CT HEAD FINDINGS For noncontrast findings, please see same day CT head. CTA NECK FINDINGS Aortic arch: Standard branching. Imaged portion shows no evidence of aneurysm or dissection. No significant stenosis of the major arch vessel origins. Right carotid system: No evidence of dissection, occlusion, or hemodynamically significant stenosis (greater than 50%). Atherosclerotic disease at the bifurcation and in the proximal ICA is not hemodynamically significant. Left carotid system: No evidence of dissection, occlusion, or hemodynamically significant stenosis (greater than 50%). Atherosclerotic disease at the bifurcation and in the proximal ICA is not hemodynamically significant. Vertebral arteries: No evidence of dissection, occlusion, or hemodynamically significant stenosis (greater than 50%). Skeleton: No acute osseous abnormality. Degenerative changes in the cervical spine. Other neck: No acute finding. Upper chest: No focal pulmonary opacity or pleural effusion. Review of the MIP images confirms the above findings CTA HEAD FINDINGS Anterior circulation: Both internal carotid arteries are patent to the termini, with mild stenosis in the distal right cavernous ICA. A1 segments patent. Azygous A2. Anterior cerebral arteries are patent to their distal aspects without significant stenosis. No M1 stenosis or occlusion. MCA branches perfused to their distal aspects without significant stenosis. Posterior circulation: Vertebral arteries patent to the vertebrobasilar junction without significant stenosis. Posterior inferior cerebellar arteries patent proximally. Basilar patent to its distal aspect without significant stenosis. Superior cerebellar arteries patent proximally. Patent P1 segments. PCAs perfused to their distal aspects without significant stenosis. The bilateral posterior communicating arteries are not visualized. Venous  sinuses: As permitted by contrast timing, patent. Anatomic variants: None significant. Review of the MIP images confirms the above findings IMPRESSION: 1. No intracranial large vessel occlusion. 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 2. No hemodynamically significant stenosis in the neck. Imaging results were communicated on 12/13/2022 at 7:28 pm to provider Dr. Amada Jupiter via secure text paging. Electronically Signed   By: Wiliam Ke M.D.   On: 12/13/2022 19:28   CT HEAD CODE STROKE WO CONTRAST  Result Date: 12/13/2022 CLINICAL DATA:  Code stroke. EXAM: CT HEAD WITHOUT CONTRAST TECHNIQUE: Contiguous axial images were obtained from the base of the skull through the vertex without intravenous contrast. RADIATION DOSE REDUCTION: This exam was performed according to the departmental dose-optimization program which includes automated exposure control, adjustment of the mA and/or kV according to patient size and/or use of iterative reconstruction technique. COMPARISON:  11/30/2021 FINDINGS: Brain: Hypodensity in the right caudate head (series 2, image 22) is new from the prior CT head but appears remote. No evidence of acute cortical infarction, hemorrhage, mass, mass effect, or midline shift. No hydrocephalus or extra-axial collection. Vascular: No hyperdense vessel. Skull: Negative for fracture or focal lesion. Sinuses/Orbits: No acute finding. Redemonstrated hyperdense left globe. Status post right lens replacement Other: The mastoid air cells are well aerated. ASPECTS [facility] Stroke Program Early CT Score) - Ganglionic level infarction (caudate, lentiform nuclei, internal capsule, insula, M1-M3 cortex): 7 - Supraganglionic infarction (M4-M6 cortex): 3 Total score (0-10 with 10 being normal): 10  IMPRESSION: 1. No acute intracranial process. ASPECTS is 10. 2. Hypodensity in the right caudate is new from the prior CT head but is favored to represent a remote lacunar infarct Imaging results were  communicated on 12/13/2022 at 7:15 pm to provider Dr. Amada Jupiter via secure text paging. Electronically Signed   By: Wiliam Ke M.D.   On: 12/13/2022 19:17    PHYSICAL EXAM  Temp:  [97.9 F (36.6 C)-98.4 F (36.9 C)] 97.9 F (36.6 C) (06/16 0924) Pulse Rate:  [67-80] 67 (06/16 0924) Resp:  [9-18] 16 (06/16 0924) BP: (110-161)/(60-83) 115/60 (06/16 0924) SpO2:  [93 %-100 %] 100 % (06/16 0924)  General - Well nourished, well developed, in no apparent distress. Cardiovascular - Regular rhythm and rate.  Mental Status -  Level of arousal and orientation to time, place, and person were intact. Language including expression, naming, repetition, comprehension was assessed and found intact. Attention span and concentration were normal. Recent and remote memory were intact. Fund of Knowledge was assessed and was intact.  Cranial Nerves II - XII - II - Visual field intact OU. III, IV, VI - Extraocular movements intact. V - Facial sensation intact bilaterally. VII - Facial movement intact bilaterally. VIII - Hearing & vestibular intact bilaterally. X - Palate elevates symmetrically. XI - Chin turning & shoulder shrug intact bilaterally. XII - Tongue protrusion intact.  Motor Strength - The patient's strength was normal in all extremities and pronator drift was absent.  Bulk was normal and fasciculations were absent.   Motor Tone - Muscle tone was assessed at the neck and appendages and was normal.  Sensory - Light touch, temperature/pinprick were assessed and were symmetrical.    Coordination - The patient had normal movements in the hands and feet with no ataxia or dysmetria.  Tremor was absent.  Gait and Station - deferred.  ASSESSMENT/PLAN Ms. Nasir Stoltman is a 67 y.o. female with history of anxiety and depression, diabetes and hyperlipidemia presenting with acute onset of word finding issues.   Stroke: Small acute right frontal Etiology: Well vessel disease Code Stroke CT  head No acute abnormality.  ASPECTS 10.    CTA head & neck no LVO MRI  Small acute infarct in the right frontal cortex and subcortical white matter. 2D Echo pending Will need outpatient follow-up with neurology in 4 to 8 weeks LDL 158 HgbA1c 9.0 VTE prophylaxis -SCDs    Diet   Diet regular Room service appropriate? Yes; Fluid consistency: Thin   No antithrombotics prior to admission, now on 81 mg aspirin daily and 75 mg Plavix daily.  For 3 weeks then aspirin alone Therapy recommendations: Pending Disposition: Pending  Hypertension Home meds: None Stable Permissive hypertension (OK if < 220/120) but gradually normalize in 5-7 days Long-term BP goal normotensive  Hyperlipidemia Home meds: Crestor 40 mg, resumed in hospital LDL 158, goal < 70 Continue statin at discharge  Diabetes type II UnControlled Home meds: Glipizide 10 mg, Farxiga, Mounjaro, metformin HgbA1c 9.0, goal < 7.0 CBGs Recent Labs    12/13/22 1847 12/14/22 0926  GLUCAP 162* 172*    SSI Will need close outpatient follow-up with PCP for close diabetes management.  She does endorse that she is not consistent with medications  Other Stroke Risk Factors Advanced Age >/= 78  Obesity, There is no height or weight on file to calculate BMI., BMI >/= 30 associated with increased stroke risk, recommend weight loss, diet and exercise as appropriate  Hx stroke/TIA   Other Active Problems  Anxiety and depression  Hospital day # 0  Gevena Mart DNP, ACNPC-AG  [facility]    I, the attending neurologist, have personally obtained a history, examined the patient, evaluated laboratory data, individually viewed imaging studies and agree with radiology interpretations. I obtained additional history from pt's at bedside. Together with the NP/PA, we formulated the assessment and plan of care which reflects our mutual decision.  I have made any additions or clarifications directly to the above note and agree  with the findings and plan as currently documented. Doing well this morning, no deficit, reports that her speech is almost normal. On exam, she is able to name, repeat and comprehend, no dysarthria and no aphasia. She does not have any deficit. Awaiting echo, if normal, then patient can likely be discharge today with DAPT for 3 weeks followed  by aspirin alone. We also discussed importance of medication adherence as she is on Crestor 3 medications for her diabetes but she is nonadherent. Plan to follow up with neurology in 8 weeks.   Windell Norfolk, MD Neurology 12/14/2022 12:27 PM      To contact Stroke Continuity provider, please refer to [email or web address]. After hours, contact General Neurology

## 2022-12-14 NOTE — Progress Notes (Signed)
OT Cancellation Note  Patient Details Name: [name] MRN: [id number] DOB: 1955-08-27   Cancelled Treatment:    Reason Eval/Treat Not Completed: OT screened, no needs identified, will sign off (Per PT, no acute OT needs. Pt is back to her functional baseline. Thank you.)  Donia Pounds 12/14/2022, 8:03 AM

## 2022-12-14 NOTE — Progress Notes (Signed)
SLP Cancellation Note  Patient Details Name: Karina Pierce MRN: [id number] DOB: 05/31/1956   Cancelled treatment:       Reason Eval/Treat Not Completed: Patient at procedure or test/unavailable (Pt off unit for echo. SLP will follow up later as schedule allows.)  Birtha Hatler I. Vear Clock, MS, CCC-SLP Neuro Diagnostic Specialist  Acute Rehabilitation Services Office number: [phone]  Scheryl Marten 12/14/2022, 2:52 PM

## 2022-12-14 NOTE — Progress Notes (Signed)
Patient with anticipated discharge. Dr. Maryfrances Bunnell asked nursing staff to follow up on pending Cardiac Ultrasound (ECHO).  Call sent to [facility] department and spoke with Ultrasound Tech who stated that patient is pending an ECHO today and department is aware.

## 2022-12-14 NOTE — Evaluation (Signed)
Speech Language Pathology Evaluation Patient Details Name: Karina Pierce MRN: [id number] DOB: 16-Jul-1955 Today's Date: 12/14/2022 Time: 4098-1191 SLP Time Calculation (min) (ACUTE ONLY): 22 min  Problem List:  Patient Active Problem List   Diagnosis Date Noted   Obesity (BMI 30-39.9) 12/14/2022   Acute ischemic stroke (HCC) 12/13/2022   Depression 12/13/2022   Coronary artery disease involving native coronary artery of native heart without angina pectoris 03/12/2022   DM2 (diabetes mellitus, type 2) (HCC) 12/03/2021   HLD (hyperlipidemia)    Primary hypertension    NSTEMI (non-ST elevated myocardial infarction) (HCC) 12/01/2021   Chest pain 11/30/2021   Past Medical History:  Past Medical History:  Diagnosis Date   Diabetes mellitus without complication (HCC)    Hyperlipidemia    Past Surgical History:  Past Surgical History:  Procedure Laterality Date   CARDIAC CATHETERIZATION     CORONARY STENT INTERVENTION N/A 12/02/2021   Procedure: CORONARY STENT INTERVENTION;  Surgeon: Runell Gess, MD;  Location: [facility] INVASIVE CV LAB;  Service: Cardiovascular;  Laterality: N/A;   KNEE SURGERY     LEFT HEART CATH AND CORONARY ANGIOGRAPHY N/A 12/02/2021   Procedure: LEFT HEART CATH AND CORONARY ANGIOGRAPHY;  Surgeon: Runell Gess, MD;  Location: [facility] INVASIVE CV LAB;  Service: Cardiovascular;  Laterality: N/A;   HPI:  Karina Pierce is a 67 y.o. female who presented with expressive aphasia. MRI brain: small acute infarct in the right frontal cortex and subcortical white matter. PMH: anemia, type 2 diabetes mellitus   Assessment / Plan / Recommendation Clinical Impression  Karina Pierce participated in speech-language-cognition evaluation with her daughter present. 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 Karina Pierce stated that she typically has some difficulty with memory. She reported that her word retrieval difficulty is notably improved and now occasional, but that  her memory is worse than baseline, and her processing speed is slower. [facility] Mental Status Examination was completed to evaluate the Karina Pierce's cognitive-linguistic skills. She achieved a score of 22/30 which is below the normal limits of 27 or more out of 30 and is suggestive of a mild impairment. She exhibited difficulty in the areas of attention, memory, and executive function as it relates to mental flexibility and complex problem solving. Motor speech skills were WNL and no overt impairments in language were noted during the evaluation or informally during conversational discourse. Skilled SLP services are clinically indicated at this time.    SLP Assessment  SLP Recommendation/Assessment: Patient needs continued Speech Lanaguage Pathology Services SLP Visit Diagnosis: Cognitive communication deficit (R41.841)    Recommendations for follow up therapy are one component of a multi-disciplinary discharge planning process, led by the attending physician.  Recommendations may be updated based on patient status, additional functional criteria and insurance authorization.    Follow Up Recommendations  Outpatient SLP    Assistance Recommended at Discharge  Intermittent Supervision/Assistance  Functional Status Assessment Patient has had a recent decline in their functional status and demonstrates the ability to make significant improvements in function in a reasonable and predictable amount of time.  Frequency and Duration min 2x/week  2 weeks      SLP Evaluation Cognition  Overall Cognitive Status: Impaired/Different from baseline Arousal/Alertness: Awake/alert Orientation Level: Oriented X4 Year: 2024 Month: June Day of Week: Correct Attention: Sustained Sustained Attention: Impaired Sustained Attention Impairment: Verbal complex Memory: Impaired Memory Impairment: Storage deficit;Retrieval deficit (Immediate:5/5 with repetition x3; delayed: 5/5 with additional processing  time.) Awareness: Appears intact  Problem Solving: Impaired Problem Solving Impairment: Verbal complex (money: 3/3; time: 1/2) Executive Function: Sequencing;Organizing Sequencing: Impaired Sequencing Impairment: Verbal complex (clock: 2/4; 4/4 with prompt) Organizing: Impaired Organizing Impairment: Verbal complex (backward digit span: 1/2)       Comprehension  Auditory Comprehension Overall Auditory Comprehension: Appears within functional limits for tasks assessed Yes/No Questions: Within Functional Limits Commands: Within Functional Limits Conversation: Complex    Expression Expression Primary Mode of Expression: Verbal Verbal Expression Overall Verbal Expression: Appears within functional limits for tasks assessed Initiation: No impairment Level of Generative/Spontaneous Verbalization: Conversation Repetition: No impairment Naming: No impairment Pragmatics: No impairment   Oral / Motor  Oral Motor/Sensory Function Overall Oral Motor/Sensory Function: Within functional limits Motor Speech Overall Motor Speech: Appears within functional limits for tasks assessed Respiration: Within functional limits Phonation: Normal Resonance: Within functional limits Articulation: Within functional limitis Intelligibility: Intelligible Motor Planning: Witnin functional limits           Ryleah Miramontes I. Vear Clock, MS, CCC-SLP Neuro Diagnostic Specialist  Acute Rehabilitation Services Office number: [phone]  Scheryl Marten 12/14/2022, 3:38 PM

## 2022-12-14 NOTE — TOC Transition Note (Signed)
Transition of Care [facility]) - CM/SW Discharge Note   Patient Details  Name: Karina Pierce MRN: [id number] Date of Birth: May 19, 1956  Transition of Care [facility]) CM/SW Contact:  Ronny Bacon, RN Phone Number: 12/14/2022, 4:01 PM   Clinical Narrative:  Patient being discharged home today. Need for outpatient speech therapy services. Patient to follow up with [facility] on [address]. Referral for [facility] neuro on [address] placed and information added to AVS.     Final next level of care: Home/Self Care Barriers to Discharge: No Barriers Identified   Patient Goals and CMS Choice      Discharge Placement                         Discharge Plan and Services Additional resources added to the After Visit Summary for                                       Social Determinants of Health (SDOH) Interventions SDOH Screenings   Depression (PHQ2-9): Medium Risk (05/06/2022)  Tobacco Use: Medium Risk (12/13/2022)     Readmission Risk Interventions     No data to display

## 2022-12-14 NOTE — Evaluation (Signed)
Physical Therapy Evaluation Patient Details Name: Karina Pierce MRN: [id number] DOB: 09-Mar-1956 Today's Date: 12/14/2022  History of Present Illness  Karina Pierce is a 67 y.o. female who presented with expressive aphasia. 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 PMHx: anemia, type 2 diabetes mellitus   Clinical Impression  PTA pt reports independence with functional mobility and ADL's, lives at home with her daughter who is currently in the room. Pt reports using a SPC PRN. Today, pt with minG for functional mobility OOB, amb 200', clears stairs, and performs UB ADL's at sink in standing. Pt intermittently reaches out for additional support from wall/chairs but with no LOB, slight reduction in gait speed, would benefit from use of [facility] for gait as she states she feels just a "little off-kilter". Pt agrees to PT recommendation. Overall pt reports feeling near baseline with improved acute symptoms, no longer requires acute PT at this time. Thank you for the consult.     Recommendations for follow up therapy are one component of a multi-disciplinary discharge planning process, led by the attending physician.  Recommendations may be updated based on patient status, additional functional criteria and insurance authorization.  Follow Up Recommendations       Assistance Recommended at Discharge PRN  Patient can return home with the following  Help with stairs or ramp for entrance;Assist for transportation    Equipment Recommendations None recommended by PT  Recommendations for Other Services       Functional Status Assessment Patient has had a recent decline in their functional status and demonstrates the ability to make significant improvements in function in a reasonable and predictable amount of time.     Precautions / Restrictions Precautions Precautions: Fall Restrictions Weight Bearing Restrictions: No      Mobility  Bed  Mobility Overal bed mobility: Independent                  Transfers Overall transfer level: Independent Equipment used: None               General transfer comment: Adequate push off, no BUE support    Ambulation/Gait Ambulation/Gait assistance: Min guard Gait Distance (Feet): 250 Feet Assistive device: None Gait Pattern/deviations: Step-through pattern Gait velocity: reduced Gait velocity interpretation: 1.31 - 2.62 ft/sec, indicative of limited community ambulator   General Gait Details: Intermittent reaches for support on wall/hand-rails, reports for safety but doesn't feel unbalanced. Pt reports slightly decreased gait speed. Rec using SPC for support.  Stairs Stairs: Yes Stairs assistance: Min guard Stair Management: Two rails, Alternating pattern Number of Stairs: 4    Wheelchair Mobility    Modified Rankin (Stroke Patients Only) Modified Rankin (Stroke Patients Only) Pre-Morbid Rankin Score: No symptoms Modified Rankin: Slight disability     Balance Overall balance assessment: Modified Independent                                           Pertinent Vitals/Pain Pain Assessment Pain Assessment: No/denies pain    Home Living Family/patient expects to be discharged to:: Private residence Living Arrangements: Children Available Help at Discharge: Family Type of Home: Apartment Home Access: Stairs to enter Entrance Stairs-Rails: Can reach both Entrance Stairs-Number of Steps: 19   Home Layout: One level Home Equipment: Cane - single point      Prior Function Prior Level of Function :  Independent/Modified Independent             Mobility Comments: Pt reports using a SPC PRN for stability. ADLs Comments: Pt reports independence PTA     Hand Dominance        Extremity/Trunk Assessment   Upper Extremity Assessment Upper Extremity Assessment: Overall WFL for tasks assessed    Lower Extremity Assessment Lower  Extremity Assessment: Overall WFL for tasks assessed;LLE deficits/detail;RLE deficits/detail RLE Deficits / Details: 5/5 MMT hip flexion, knee ext/flex, DF/PF LLE Deficits / Details: Same as RLE    Cervical / Trunk Assessment Cervical / Trunk Assessment: Normal  Communication   Communication: No difficulties  Cognition Arousal/Alertness: Awake/alert Behavior During Therapy: WFL for tasks assessed/performed Overall Cognitive Status: Within Functional Limits for tasks assessed                                 General Comments: 3/3 short term recall, A&Ox4        General Comments General comments (skin integrity, edema, etc.): Reported LLE fatigue post 200' amb.    Exercises     Assessment/Plan    PT Assessment Patient does not need any further PT services  PT Problem List         PT Treatment Interventions      PT Goals (Current goals can be found in the Care Plan section)  Acute Rehab PT Goals Patient Stated Goal: did not state PT Goal Formulation: All assessment and education complete, DC therapy    Frequency       Co-evaluation               AM-PAC PT "6 Clicks" Mobility  Outcome Measure Help needed turning from your back to your side while in a flat bed without using bedrails?: None Help needed moving from lying on your back to sitting on the side of a flat bed without using bedrails?: None Help needed moving to and from a bed to a chair (including a wheelchair)?: None Help needed standing up from a chair using your arms (e.g., wheelchair or bedside chair)?: None Help needed to walk in hospital room?: A Little Help needed climbing 3-5 steps with a railing? : A Little 6 Click Score: 22    End of Session Equipment Utilized During Treatment: Gait belt Activity Tolerance: Patient tolerated treatment well Patient left: in bed;with call bell/phone within reach;with nursing/sitter in room Nurse Communication: Mobility status PT Visit Diagnosis:  Other symptoms and signs involving the nervous system (R29.898);Difficulty in walking, not elsewhere classified (R26.2);Other abnormalities of gait and mobility (R26.89)    Time: 0740-0800 PT Time Calculation (min) (ACUTE ONLY): 20 min   Charges:   PT Evaluation $PT Eval Low Complexity: 1 Low          Hendricks Milo, SPT  Acute Rehabilitation Services   Hendricks Milo 12/14/2022, 10:45 AM

## 2022-12-14 NOTE — Progress Notes (Signed)
Pt returned to unit from [facility]

## 2022-12-14 NOTE — Discharge Summary (Signed)
Physician Discharge Summary   Patient: Karina Pierce MRN: [id number] DOB: 08-Feb-1956  Admit date:     12/13/2022  Discharge date: 12/14/22  Discharge Physician: Alberteen Sam   PCP: Leilani Able, MD     Recommendations at discharge:  Follow-up with neurology for stroke in 6 to 8 weeks Follow-up with PCP Dr. Pecola Leisure for uncontrolled diabetes and hypertension Dr. Madilyn Hook: Please follow-up lipids and 3 months on higher dose Crestor     Discharge Diagnoses: Principal Problem:   Acute ischemic stroke [facility]) Active Problems:   HLD (hyperlipidemia)   Primary hypertension   DM2 (diabetes mellitus, type 2) (HCC)   Coronary artery disease involving native coronary artery of native heart without angina pectoris   Depression   Obesity (BMI 30-39.9)     Hospital Course: Mrs. Karina Pierce is a 67 y.o. F with obesity, DM, CAD, HTN, and HLD who presented with sudden onset aphasia.  This resolved by arrival to the ER.  MRI brain showed small right frontal infarct.     Stroke MRI brain showed small right frontal infarct - Non-invasive angiography showed no significant intracranial atherosclerosis - Echocardiogram showed no cardiogenic source of embolism - Carotid imaging unremarkable   - Lipids ordered: LDL greater than 140, Crestor dose increased - Aspirin ordered at admission, discharged on DAPT for 3 weeks followed by aspirin alone indefinitely - Atrial fibrillation: Not detected on monitoring - tPA not given because symptoms resolved - Dysphagia screen ordered in ER - PT eval ordered: Outpatient speech therapy, referral sent - Nonsmoker     Type 2 diabetes Hemoglobin A1c 9%.  Patient reported intermittent missed doses. - Refills of her home medicines were sent        The [facility] Controlled Substances Registry was reviewed for this patient prior to discharge.  Consultants: Neurology Procedures performed:  MRI brain CT head CT angiogram head and  neck Echocardiogram  Disposition: Home Diet recommendation:  Discharge Diet Orders (From admission, onward)     Start     Ordered   12/14/22 0000  Diet - low sodium heart healthy        12/14/22 1553             DISCHARGE MEDICATION: Allergies as of 12/14/2022   No Known Allergies      Medication List     TAKE these medications    acetaminophen 500 MG tablet Commonly known as: TYLENOL Take 1 tablet (500 mg total) by mouth every 6 (six) hours as needed. What changed: reasons to take this   aspirin EC 81 MG tablet Take 1 tablet (81 mg total) by mouth daily. Swallow whole.   clopidogrel 75 MG tablet Commonly known as: PLAVIX Take 1 tablet (75 mg total) by mouth daily. Start taking on: December 15, 2022   escitalopram 10 MG tablet Commonly known as: LEXAPRO Take 10 mg by mouth daily.   Farxiga 10 MG Tabs tablet Generic drug: dapagliflozin propanediol Take 1 tablet (10 mg total) by mouth daily.   glipiZIDE 10 MG 24 hr tablet Commonly known as: GLUCOTROL XL Take 1 tablet (10 mg total) by mouth daily with breakfast.   metFORMIN 500 MG tablet Commonly known as: GLUCOPHAGE Take 1 tablet (500 mg total) by mouth See admin instructions. Pt takes every 3 days   Mounjaro 2.5 MG/0.5ML Pen Generic drug: tirzepatide Inject 2.5 mg into the skin once a week.   nitroGLYCERIN 0.4 MG SL tablet Commonly known as: NITROSTAT Place 1 tablet (0.4 mg total)  under the tongue every 5 (five) minutes x 3 doses as needed for chest pain.   rosuvastatin 40 MG tablet Commonly known as: CRESTOR Take 1 tablet (40 mg total) by mouth daily at 6 PM. What changed:  how much to take additional instructions   traMADol 50 MG tablet Commonly known as: ULTRAM Take 50 mg by mouth every 6 (six) hours as needed for moderate pain.        Follow-up Information     [facility] Follow up.   Contact information: [address]     [address] [place]  [address] [phone]        [facility] Follow up.   Specialty: Rehabilitation Why: Speech Therapy. Call to follow up when discharged. Contact information: [address] [id number] mc [place] [address] [phone]                Discharge Instructions     Ambulatory Referral to Neuro Rehab   Complete by: As directed    Speech therapy   Diet - low sodium heart healthy   Complete by: As directed    Discharge instructions   Complete by: As directed    **IMPORTANT DISCHARGE INSTRUCTIONS**   From Dr. Maryfrances Bunnell: You were admitted for small stroke.    You had an MRI brain that showed a small stroke.    You had imaging of the arteries of the head and neck that showed no concerning findings, and an ultrasound of the heart (echocardiogram that also showed no concerning findings).  You had testing of your cholesterol that showed high cholesterol It is important that you take the full 40 mg Crestor every night   You also had testing of your blood sugars that showed that your blood sugars are high.  Your hemoglobin A1c shows that your blood sugars average around 200 which is too high and contributes to stroke risk  I have sent refills of your diabetes medicines and you should take these every day  Lastly you should take an aspirin and Plavix to prevent platelets sticking together Take aspirin 81 mg daily from now on For the next 3 weeks, take clopidogrel/Plavix 75 mg once daily  Follow-up with your primary care doctor in 1 week  Follow-up with the neurologist at [facility] in 6 to 8 weeks   Increase activity slowly   Complete by: As directed        Discharge Exam: There were no vitals filed for this visit.  General: Pt is alert, awake, not in acute distress Cardiovascular: RRR, nl S1-S2, no murmurs appreciated.   No LE edema.   Respiratory: Normal respiratory rate and rhythm.  CTAB  without rales or wheezes. Abdominal: Abdomen soft and non-tender.  No distension or HSM.   Neuro/Psych: Strength symmetric in upper and lower extremities.  Judgment and insight appear normal.   Condition at discharge: good  The results of significant diagnostics from this hospitalization (including imaging, microbiology, ancillary and laboratory) are listed below for reference.   Imaging Studies: ECHOCARDIOGRAM COMPLETE BUBBLE STUDY  Result Date: 12/14/2022    ECHOCARDIOGRAM REPORT   Patient Name:   VENICE EOFF Date of Exam: 12/14/2022 Medical Rec #:  [id number]  Height:       69.0 in Accession #:    [id number] Weight:       235.8 lb Date of Birth:  July 09, 1955  BSA:          2.216 m  Patient Age:    67 years   BP:           122/60 mmHg Patient Gender: F          HR:           65 bpm. Exam Location:  Inpatient Procedure: 2D Echo, Cardiac Doppler, Color Doppler and Saline Contrast Bubble            Study Indications:    Stroke  History:        Patient has prior history of Echocardiogram examinations, most                 recent 11/30/2021. Stroke; Risk Factors:Hypertension, Diabetes and                 Dyslipidemia.  Sonographer:    Melissa Morford RDCS (AE, PE) Referring Phys: [id number] JUSTIN B HOWERTER IMPRESSIONS  1. Left ventricular ejection fraction, by estimation, is 55 to 60%. The left ventricle has normal function. The left ventricle has no regional wall motion abnormalities. The left ventricular internal cavity size was mildly dilated. Left ventricular diastolic parameters were normal.  2. Right ventricular systolic function is normal. The right ventricular size is normal.  3. The mitral valve is abnormal. Mild mitral valve regurgitation. No evidence of mitral stenosis.  4. The aortic valve is tricuspid. There is moderate calcification of the aortic valve. There is moderate thickening of the aortic valve. Aortic valve regurgitation is mild. Aortic valve sclerosis/calcification is present, without any  evidence of aortic stenosis.  5. The inferior vena cava is normal in size with greater than 50% respiratory variability, suggesting right atrial pressure of 3 mmHg.  6. Agitated saline contrast bubble study was negative, with no evidence of any interatrial shunt. FINDINGS  Left Ventricle: Left ventricular ejection fraction, by estimation, is 55 to 60%. The left ventricle has normal function. The left ventricle has no regional wall motion abnormalities. The left ventricular internal cavity size was mildly dilated. There is  no left ventricular hypertrophy. Left ventricular diastolic parameters were normal. Right Ventricle: The right ventricular size is normal. No increase in right ventricular wall thickness. Right ventricular systolic function is normal. Left Atrium: Left atrial size was normal in size. Right Atrium: Right atrial size was normal in size. Pericardium: There is no evidence of pericardial effusion. Mitral Valve: The mitral valve is abnormal. There is moderate thickening of the mitral valve leaflet(s). There is mild calcification of the mitral valve leaflet(s). Mild mitral valve regurgitation. No evidence of mitral valve stenosis. Tricuspid Valve: The tricuspid valve is normal in structure. Tricuspid valve regurgitation is mild . No evidence of tricuspid stenosis. Aortic Valve: The aortic valve is tricuspid. There is moderate calcification of the aortic valve. There is moderate thickening of the aortic valve. Aortic valve regurgitation is mild. Aortic regurgitation PHT measures 623 msec. Aortic valve sclerosis/calcification is present, without any evidence of aortic stenosis. Pulmonic Valve: The pulmonic valve was normal in structure. Pulmonic valve regurgitation is trivial. No evidence of pulmonic stenosis. Aorta: The aortic root is normal in size and structure. Venous: The inferior vena cava is normal in size with greater than 50% respiratory variability, suggesting right atrial pressure of 3 mmHg.  IAS/Shunts: No atrial level shunt detected by color flow Doppler. Agitated saline contrast was given intravenously to evaluate for intracardiac shunting. Agitated saline contrast bubble study was negative, with no evidence of any interatrial shunt.  LEFT VENTRICLE PLAX 2D LVIDd:  4.70 cm   Diastology LVIDs:         3.10 cm   LV e' medial:    5.66 cm/s LV PW:         1.10 cm   LV E/e' medial:  17.1 LV IVS:        0.90 cm   LV e' lateral:   7.62 cm/s LVOT diam:     2.20 cm   LV E/e' lateral: 12.7 LV SV:         79 LV SV Index:   36 LVOT Area:     3.80 cm  RIGHT VENTRICLE RV S prime:     11.60 cm/s TAPSE (M-mode): 1.4 cm LEFT ATRIUM             Index        RIGHT ATRIUM           Index LA diam:        3.60 cm 1.62 cm/m   RA Area:     10.00 cm LA Vol (A2C):   30.6 ml 13.81 ml/m  RA Volume:   19.40 ml  8.76 ml/m LA Vol (A4C):   50.8 ml 22.93 ml/m LA Biplane Vol: 41.4 ml 18.68 ml/m  AORTIC VALVE LVOT Vmax:   90.20 cm/s LVOT Vmean:  68.400 cm/s LVOT VTI:    0.207 m AI PHT:      623 msec  AORTA Ao Root diam: 2.60 cm Ao Asc diam:  3.00 cm MITRAL VALVE               TRICUSPID VALVE MV Area (PHT): 4.86 cm    TR Peak grad:   17.5 mmHg MV Decel Time: 156 msec    TR Vmax:        209.00 cm/s MV E velocity: 97.00 cm/s MV A velocity: 94.10 cm/s  SHUNTS MV E/A ratio:  1.03        Systemic VTI:  0.21 m                            Systemic Diam: 2.20 cm Charlton Haws MD Electronically signed by Charlton Haws MD Signature Date/Time: 12/14/2022/3:37:29 PM    Final    MR BRAIN WO CONTRAST  Result Date: 12/13/2022 CLINICAL DATA:  Acute onset expressive aphasia, now resolved; transient ischemic attack EXAM: MRI HEAD WITHOUT CONTRAST TECHNIQUE: Multiplanar, multiecho pulse sequences of the brain and surrounding structures were obtained without intravenous contrast. COMPARISON:  No prior MRI available, correlation is made with CT head 12/13/2022 FINDINGS: Brain: Small focus of restricted diffusion with ADC correlate in the  right frontal cortex and subcortical white matter (series 5, images 79-80), which measures up to 10 mm. This areas is not associated with increased T2 hyperintense signal and is favored to represent an acute infarct. No acute hemorrhage, mass, mass effect, or midline shift. No hydrocephalus or extra-axial collection. Remote lacunar infarct in the right caudate. Scattered T2 hyperintense signal in the periventricular white matter, likely the sequela of minimal chronic small vessel ischemic disease. Vascular: Normal arterial flow voids. Skull and upper cervical spine: Normal marrow signal. Sinuses/Orbits: Clear paranasal sinuses. Status post right lens replacement. Postprocedural findings in the left globe. Other: Trace fluid in left mastoid air cells. IMPRESSION: Small acute infarct in the right frontal cortex and subcortical white matter. Imaging results were communicated on 12/13/2022 at 10:12 pm to provider Dr. Amada Jupiter via secure text paging. Electronically Signed  By: Wiliam Ke M.D.   On: 12/13/2022 22:13   CT ANGIO HEAD NECK W WO CM (CODE STROKE)  Result Date: 12/13/2022 CLINICAL DATA:  Stroke suspected EXAM: CT ANGIOGRAPHY HEAD AND NECK WITH AND WITHOUT CONTRAST TECHNIQUE: Multidetector CT imaging of the head and neck was performed using the standard protocol during bolus administration of intravenous contrast. Multiplanar CT image reconstructions and MIPs were obtained to evaluate the vascular anatomy. Carotid stenosis measurements (when applicable) are obtained utilizing NASCET criteria, using the distal internal carotid diameter as the denominator. RADIATION DOSE REDUCTION: This exam was performed according to the departmental dose-optimization program which includes automated exposure control, adjustment of the mA and/or kV according to patient size and/or use of iterative reconstruction technique. CONTRAST:  75mL OMNIPAQUE IOHEXOL 350 MG/ML SOLN COMPARISON:  No prior CTA available, correlation  is made with same day CT head FINDINGS: CT HEAD FINDINGS For noncontrast findings, please see same day CT head. CTA NECK FINDINGS Aortic arch: Standard branching. Imaged portion shows no evidence of aneurysm or dissection. No significant stenosis of the major arch vessel origins. Right carotid system: No evidence of dissection, occlusion, or hemodynamically significant stenosis (greater than 50%). Atherosclerotic disease at the bifurcation and in the proximal ICA is not hemodynamically significant. Left carotid system: No evidence of dissection, occlusion, or hemodynamically significant stenosis (greater than 50%). Atherosclerotic disease at the bifurcation and in the proximal ICA is not hemodynamically significant. Vertebral arteries: No evidence of dissection, occlusion, or hemodynamically significant stenosis (greater than 50%). Skeleton: No acute osseous abnormality. Degenerative changes in the cervical spine. Other neck: No acute finding. Upper chest: No focal pulmonary opacity or pleural effusion. Review of the MIP images confirms the above findings CTA HEAD FINDINGS Anterior circulation: Both internal carotid arteries are patent to the termini, with mild stenosis in the distal right cavernous ICA. A1 segments patent. Azygous A2. Anterior cerebral arteries are patent to their distal aspects without significant stenosis. No M1 stenosis or occlusion. MCA branches perfused to their distal aspects without significant stenosis. Posterior circulation: Vertebral arteries patent to the vertebrobasilar junction without significant stenosis. Posterior inferior cerebellar arteries patent proximally. Basilar patent to its distal aspect without significant stenosis. Superior cerebellar arteries patent proximally. Patent P1 segments. PCAs perfused to their distal aspects without significant stenosis. The bilateral posterior communicating arteries are not visualized. Venous sinuses: As permitted by contrast timing, patent.  Anatomic variants: None significant. Review of the MIP images confirms the above findings IMPRESSION: 1. No intracranial large vessel occlusion. 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 2. No hemodynamically significant stenosis in the neck. Imaging results were communicated on 12/13/2022 at 7:28 pm to provider Dr. Amada Jupiter via secure text paging. Electronically Signed   By: Wiliam Ke M.D.   On: 12/13/2022 19:28   CT HEAD CODE STROKE WO CONTRAST  Result Date: 12/13/2022 CLINICAL DATA:  Code stroke. EXAM: CT HEAD WITHOUT CONTRAST TECHNIQUE: Contiguous axial images were obtained from the base of the skull through the vertex without intravenous contrast. RADIATION DOSE REDUCTION: This exam was performed according to the departmental dose-optimization program which includes automated exposure control, adjustment of the mA and/or kV according to patient size and/or use of iterative reconstruction technique. COMPARISON:  11/30/2021 FINDINGS: Brain: Hypodensity in the right caudate head (series 2, image 22) is new from the prior CT head but appears remote. No evidence of acute cortical infarction, hemorrhage, mass, mass effect, or midline shift. No hydrocephalus or extra-axial collection. Vascular: No hyperdense vessel. Skull: Negative  for fracture or focal lesion. Sinuses/Orbits: No acute finding. Redemonstrated hyperdense left globe. Status post right lens replacement Other: The mastoid air cells are well aerated. ASPECTS [facility] Stroke Program Early CT Score) - Ganglionic level infarction (caudate, lentiform nuclei, internal capsule, insula, M1-M3 cortex): 7 - Supraganglionic infarction (M4-M6 cortex): 3 Total score (0-10 with 10 being normal): 10 IMPRESSION: 1. No acute intracranial process. ASPECTS is 10. 2. 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  Amada Jupiter via secure text paging. Electronically Signed   By: Wiliam Ke M.D.   On: 12/13/2022 19:17    Microbiology: No results found for this or any previous visit.  Labs: CBC: Recent Labs  Lab 12/13/22 1857 12/13/22 1913 12/14/22 0405  WBC 6.5  --  7.0  NEUTROABS 3.2  --  2.2  HGB 12.3 13.3 11.8*  HCT 38.8 39.0 36.8  MCV 83.1  --  82.5  PLT 403*  --  363   Basic Metabolic Panel: Recent Labs  Lab 12/13/22 1857 12/13/22 1913 12/14/22 0405  NA 136 139 135  K 4.1 4.2 3.7  CL 104 106 103  CO2 24  --  25  GLUCOSE 181* 182* 195*  BUN 12 15 14   CREATININE 1.04* 1.00 0.96  CALCIUM 9.7  --  8.8*  MG 1.9  --  1.9   Liver Function Tests: Recent Labs  Lab 12/13/22 1857 12/14/22 0405  AST 15 14*  ALT 15 13  ALKPHOS 85 77  BILITOT 0.9 0.7  PROT 6.7 5.8*  ALBUMIN 3.3* 2.9*   CBG: Recent Labs  Lab 12/13/22 1847 12/14/22 0926 12/14/22 1221 12/14/22 1634  GLUCAP 162* 172* 202* 195*    Discharge time spent: approximately 25 minutes spent on discharge counseling, evaluation of patient on day of discharge, and coordination of discharge planning with nursing, social work, pharmacy and case management  Signed: Alberteen Sam, MD [facility] 12/14/2022

## 2022-12-17 ENCOUNTER — Ambulatory Visit: Payer: 59 | Attending: Family Medicine

## 2022-12-17 DIAGNOSIS — R41841 Cognitive communication deficit: Secondary | ICD-10-CM | POA: Diagnosis present

## 2022-12-17 NOTE — Therapy (Unsigned)
OUTPATIENT SPEECH LANGUAGE PATHOLOGY EVALUATION   Patient Name: Karina Pierce MRN: [id number] DOB:March 05, 1956, 67 y.o., female Today's Date: 12/18/2022  PCP: Leilani Able, MDPCP - General  REFERRING PROVIDER: Alberteen Sam, MD (Ref Provider)   END OF SESSION:  End of Session - 12/17/22 1317     Visit Number 1    Number of Visits 7    Date for SLP Re-Evaluation 01/28/23    Authorization Type [employer]    SLP Start Time 1240   arrived late   SLP Stop Time  1315    SLP Time Calculation (min) 35 min    Activity Tolerance Patient tolerated treatment well             Past Medical History:  Diagnosis Date   Diabetes mellitus without complication (HCC)    Hyperlipidemia    Past Surgical History:  Procedure Laterality Date   CARDIAC CATHETERIZATION     CORONARY STENT INTERVENTION N/A 12/02/2021   Procedure: CORONARY STENT INTERVENTION;  Surgeon: Runell Gess, MD;  Location: [facility];  Service: Cardiovascular;  Laterality: N/A;   KNEE SURGERY     LEFT HEART CATH AND CORONARY ANGIOGRAPHY N/A 12/02/2021   Procedure: LEFT HEART CATH AND CORONARY ANGIOGRAPHY;  Surgeon: Runell Gess, MD;  Location: [facility];  Service: Cardiovascular;  Laterality: N/A;   Patient Active Problem List   Diagnosis Date Noted   Obesity (BMI 30-39.9) 12/14/2022   Acute ischemic stroke (HCC) 12/13/2022   Depression 12/13/2022   Coronary artery disease involving native coronary artery of native heart without angina pectoris 03/12/2022   DM2 (diabetes mellitus, type 2) (HCC) 12/03/2021   HLD (hyperlipidemia)    Primary hypertension    NSTEMI (non-ST elevated myocardial infarction) (HCC) 12/01/2021   Chest pain 11/30/2021    ONSET DATE: 12/14/22   REFERRING DIAG:  R41.89 (ICD-10-CM) - Alteration in cognition   THERAPY DIAG: Cognitive communication deficit  Rationale for Evaluation and Treatment: Rehabilitation  SUBJECTIVE:   SUBJECTIVE STATEMENT: "So far so  good."  Pt accompanied by: family member- daughter and granddaughter.   PERTINENT HISTORY: Pt is a 67 y.o. female who presented with expressive aphasia. MRI brain: small acute infarct in the right frontal cortex and subcortical white matter. PMH: anemia, type 2 diabetes mellitus   PAIN:  Are you having pain? No  FALLS: Has patient fallen in last 6 months?  No  LIVING ENVIRONMENT: Lives with: lives with their family and lives with their daughter Lives in: House/apartment  PLOF:  Level of assistance: Independent with ADLs, Independent with IADLs Employment: Retired  PATIENT GOALS: "get better"   OBJECTIVE:   COGNITION: Overall cognitive status: Impaired (mild) Areas of impairment:  Attention: Impaired: Divided Memory: Impaired: Working Psychologist, educational function: Impaired: Problem solving and Slow processing Functional deficits: Improvements in cognition since hospitalization reported; however, daughter reported concern for reduced problem solving and delayed processing particularly as task complexity increases. Pt endorsed some occasional word finding as "words on tip of tongue." Mild reduced recall noted while completing PROM with intermittent reminders required to recall/attend to instructions.    COGNITIVE COMMUNICATION: Following directions: Follows multi-step commands inconsistently per daughter Auditory comprehension: Impaired: Daughter reported reduced auditory comprehension Verbal expression: Impaired: Wording finding, working Runner, broadcasting/film/video communication: WFL  ORAL MOTOR EXAMINATION: Overall status: WFL Comments: Observed to be within functional limits.   STANDARDIZED ASSESSMENTS: SLUMS: 27/27 (score may have been improved by prior conversation)  PATIENT REPORTED OUTCOME MEASURES (PROM): Cognitive Function: Patient rated  121/140 on cognitive function, states struggles mostly in word finding, memory,    TODAY'S TREATMENT:                                                                                                                                          12/17/22: Daughter states that patient struggled with two step directions after the stroke. Patient demonstrates and endorses limited awareness of deficits and looked to daughter to answer most questions accurately. Patient states that some of these issues have happened before the stroke, but that she has difficulty finding words, remembering where she placed things. She is currently able to complete some household chores such as cooking, and experiences no memory mistakes with these actions as they are muscle memory to her. SLP educated Pt and daughter on role of ST, and patient endorses want/need for ST services at this time to address mild changes s/p stroke. All questions answered to patient satisfaction.    PATIENT EDUCATION: Education details: see above Person educated: Patient and Child(ren) Education method: Explanation Education comprehension: verbalized understanding and needs further education   GOALS: Goals reviewed with patient? Yes  LONG TERM GOALS: Target date: 01/28/2023 (STGs=LTGs)   Patient will utilize external memory aids with rare min A to reduce memory mistakes > 1 week  Baseline:  Goal status: INITIAL  2.  Patient will teach back and utilize internal memory aids as needed to improve functional cognitive performance given rare min-A > 1 week  Baseline:  Goal status: INITIAL  3.  Patient will teach back word retrieval strategies to address word finding with rare min-A  Baseline:  Goal status: INITIAL  4.  Patient will engage in 30 min unstructured conversation, utilizing word finding strategies with rare min-A.   Baseline:  Goal status: INITIAL   ASSESSMENT:  CLINICAL IMPRESSION: Patient is a 67 y.o. F who was seen today for acute ischemic stroke. Patient presented with mild cognitive communication impairment in relation to reduced recall, delayed processing, and occasional  word finding. PROM collected revealed most significant changes for word finding and functional recall (losing items, forgetting items at store). Patient has deferred management of medication and appointments to daughter d/t vision changes, but is reportedly independent in the area of cooking and other house hold chores. Cognitive assessment shows improvement when compared to discharge from inpatient treatment but endorsed some mild ongoing cognitive changes resulting in change in baseline functioning. ST is recommended at this time to increase independence on daily tasks and to improve patient quality of life.   OBJECTIVE IMPAIRMENTS: include attention, memory, and expressive language. These impairments are limiting patient from household responsibilities and effectively communicating at home and in community. Factors affecting potential to achieve goals and functional outcome are ability to learn/carryover information.. Patient will benefit from skilled SLP services to address above impairments and improve overall function.  REHAB POTENTIAL: Good  PLAN:  SLP  FREQUENCY: 1x/week  SLP DURATION: 6 weeks (to account for scheduling)  PLANNED INTERVENTIONS: Functional tasks, SLP instruction and feedback, Compensatory strategies, and Patient/family education    Gracy Racer, CCC-SLP 12/18/2022, 9:15 AM

## 2022-12-24 ENCOUNTER — Ambulatory Visit: Payer: 59

## 2022-12-24 NOTE — Therapy (Deleted)
OUTPATIENT SPEECH LANGUAGE PATHOLOGY TREATMENT   Patient Name: Karina Pierce MRN: [id number] DOB:03-Feb-1956, 67 y.o., female Today's Date: 12/24/2022  PCP: Leilani Able, MDPCP - General  REFERRING PROVIDER: Alberteen Sam, MD (Ref Provider)   END OF SESSION:    Past Medical History:  Diagnosis Date   Diabetes mellitus without complication (HCC)    Hyperlipidemia    Past Surgical History:  Procedure Laterality Date   CARDIAC CATHETERIZATION     CORONARY STENT INTERVENTION N/A 12/02/2021   Procedure: CORONARY STENT INTERVENTION;  Surgeon: Runell Gess, MD;  Location: [facility];  Service: Cardiovascular;  Laterality: N/A;   KNEE SURGERY     LEFT HEART CATH AND CORONARY ANGIOGRAPHY N/A 12/02/2021   Procedure: LEFT HEART CATH AND CORONARY ANGIOGRAPHY;  Surgeon: Runell Gess, MD;  Location: [facility];  Service: Cardiovascular;  Laterality: N/A;   Patient Active Problem List   Diagnosis Date Noted   Obesity (BMI 30-39.9) 12/14/2022   Acute ischemic stroke (HCC) 12/13/2022   Depression 12/13/2022   Coronary artery disease involving native coronary artery of native heart without angina pectoris 03/12/2022   DM2 (diabetes mellitus, type 2) (HCC) 12/03/2021   HLD (hyperlipidemia)    Primary hypertension    NSTEMI (non-ST elevated myocardial infarction) (HCC) 12/01/2021   Chest pain 11/30/2021    ONSET DATE: 12/14/22   REFERRING DIAG:  R41.89 (ICD-10-CM) - Alteration in cognition   THERAPY DIAG: No diagnosis found.  Rationale for Evaluation and Treatment: Rehabilitation  SUBJECTIVE:   SUBJECTIVE STATEMENT: *** Pt accompanied by: family member- daughter and granddaughter.   PERTINENT HISTORY: Pt is a 67 y.o. female who presented with expressive aphasia. MRI brain: small acute infarct in the right frontal cortex and subcortical white matter. PMH: anemia, type 2 diabetes mellitus   PAIN:  Are you having pain? No  FALLS: Has patient fallen  in last 6 months?  No  LIVING ENVIRONMENT: Lives with: lives with their family and lives with their daughter Lives in: House/apartment  PLOF:  Level of assistance: Independent with ADLs, Independent with IADLs Employment: Retired  PATIENT GOALS: "get better"   OBJECTIVE:   COGNITION: Overall cognitive status: Impaired (mild) Areas of impairment:  Attention: Impaired: Divided Memory: Impaired: Working Psychologist, educational function: Impaired: Problem solving and Slow processing Functional deficits: Improvements in cognition since hospitalization reported; however, daughter reported concern for reduced problem solving and delayed processing particularly as task complexity increases. Pt endorsed some occasional word finding as "words on tip of tongue." Mild reduced recall noted while completing PROM with intermittent reminders required to recall/attend to instructions.    COGNITIVE COMMUNICATION: Following directions: Follows multi-step commands inconsistently per daughter Auditory comprehension: Impaired: Daughter reported reduced auditory comprehension Verbal expression: Impaired: Wording finding, working Runner, broadcasting/film/video communication: WFL  ORAL MOTOR EXAMINATION: Overall status: WFL Comments: Observed to be within functional limits.   STANDARDIZED ASSESSMENTS: SLUMS: 27/27 (score may have been improved by prior conversation)  PATIENT REPORTED OUTCOME MEASURES (PROM): Cognitive Function: Patient rated 121/140 on cognitive function, states struggles mostly in word finding, memory,    TODAY'S TREATMENT:  12/24/22: ***   12/17/22: Daughter states that patient struggled with two step directions after the stroke. Patient demonstrates and endorses limited awareness of deficits and looked to daughter to answer most questions accurately. Patient states that some  of these issues have happened before the stroke, but that she has difficulty finding words, remembering where she placed things. She is currently able to complete some household chores such as cooking, and experiences no memory mistakes with these actions as they are muscle memory to her. SLP educated Pt and daughter on role of ST, and patient endorses want/need for ST services at this time to address mild changes s/p stroke. All questions answered to patient satisfaction.    PATIENT EDUCATION: Education details: see above Person educated: Patient and Child(ren) Education method: Explanation Education comprehension: verbalized understanding and needs further education   GOALS: Goals reviewed with patient? Yes  LONG TERM GOALS: Target date: 01/28/2023 (STGs=LTGs)   Patient will utilize external memory aids with rare min A to reduce memory mistakes > 1 week  Baseline:  Goal status: INITIAL  2.  Patient will teach back and utilize internal memory aids as needed to improve functional cognitive performance given rare min-A > 1 week  Baseline:  Goal status: INITIAL  3.  Patient will teach back word retrieval strategies to address word finding with rare min-A  Baseline:  Goal status: INITIAL  4.  Patient will engage in 30 min unstructured conversation, utilizing word finding strategies with rare min-A.   Baseline:  Goal status: INITIAL   ASSESSMENT:  CLINICAL IMPRESSION: Patient is a 67 y.o. F who was seen today for acute ischemic stroke. Patient presented with mild cognitive communication impairment in relation to reduced recall, delayed processing, and occasional word finding. PROM collected revealed most significant changes for word finding and functional recall (losing items, forgetting items at store). Patient has deferred management of medication and appointments to daughter d/t vision changes, but is reportedly independent in the area of cooking and other house hold chores.  Cognitive assessment shows improvement when compared to discharge from inpatient treatment but endorsed some mild ongoing cognitive changes resulting in change in baseline functioning. ST is recommended at this time to increase independence on daily tasks and to improve patient quality of life.   OBJECTIVE IMPAIRMENTS: include attention, memory, and expressive language. These impairments are limiting patient from household responsibilities and effectively communicating at home and in community. Factors affecting potential to achieve goals and functional outcome are ability to learn/carryover information.. Patient will benefit from skilled SLP services to address above impairments and improve overall function.  REHAB POTENTIAL: Good  PLAN:  SLP FREQUENCY: 1x/week  SLP DURATION: 6 weeks (to account for scheduling)  PLANNED INTERVENTIONS: Functional tasks, SLP instruction and feedback, Compensatory strategies, and Patient/family education    [place], Student-SLP 12/24/2022, 7:52 AM

## 2022-12-29 ENCOUNTER — Ambulatory Visit: Payer: 59

## 2022-12-29 NOTE — Progress Notes (Deleted)
Cardiology Office Note:   Date:  12/29/2022  ID:  Karina Pierce, DOB 1956-02-27, MRN [id number] PCP: Leilani Able, MD  [facility] Providers Cardiologist:  Rollene Rotunda, MD {  History of Present Illness:   Karina Pierce is a 67 y.o. female  for non-STEMI with DES to the RCA.  This happened in June.  She presented initially with syncope.  She has a history of CAD with NSTEMI . hypertension, hyperlipidemia, type 2 diabetes mellitus, anemia, and former tobacco abuse.  Chest CTA was negative for PE but did show areas of ground glass attenuation and mild septal thickening bilaterally which could suggest mild interstitial edema or possible interstitial lung disease. Echo showed LVEF of 60-65% with normal wall motion, mild MR, and mildly elevated PASP of 39.8 mmHg. LHC on 6/5 showed 95% stenosis of proximal to mid RCA and 30% stenosis of mid LAD. She underwent successful PCI with DES to RCA. She initially started on DAPT with Aspirin and Brilinta but developed significant dyspnea so was transitioned to Plavix prior to discharge. She was also started on a beta-blocker and high-intensity statin.   Since she was last seen she was in the hospital with stroke.  She had a small right frontal infarct.  Carotid an echo were unremarkable.  She had no arrhythmia.   ***  ***  she has done well.  She does not exercise very much because she has vision problems.  She has none of the symptoms that prompted her evaluation. The patient denies any new symptoms such as chest discomfort, neck or arm discomfort. There has been no new shortness of breath, PND or orthopnea. There have been no reported palpitations, presyncope or syncope.     ROS: ***  Studies Reviewed:    EKG:       ***  Risk Assessment/Calculations:   {Does this patient have ATRIAL FIBRILLATION?:[phone]} No BP recorded.  {Refresh Note OR Click here to enter BP  :1}***        Physical Exam:   VS:  There were no vitals taken for this visit.    Wt Readings from Last 3 Encounters:  10/09/22 235 lb 12.8 oz (107 kg)  09/11/22 244 lb (110.7 kg)  05/06/22 241 lb 6.5 oz (109.5 kg)     GEN: Well nourished, well developed in no acute distress NECK: No JVD; No carotid bruits CARDIAC: ***RRR, no murmurs, rubs, gallops RESPIRATORY:  Clear to auscultation without rales, wheezing or rhonchi  ABDOMEN: Soft, non-tender, non-distended EXTREMITIES:  No edema; No deformity   ASSESSMENT AND PLAN:   Stroke: ***  CAD:  ***   The patient has no new sypmtoms.  No further cardiovascular testing is indicated.  We will continue with aggressive risk reduction and meds as listed.she can stop the Plavix in June.   Dyslipidemia:   LDL was ***  reviously 143.  I will check a lipid profile and LP(a) today with a goal LDL in the 70s.  We talked about a plant-based diet.  She might need a PCSK9 inhibitor or at least the addition of Zetia.     DM: Her A1c was ***  10.  I will check an A1c.  Her primary provider suggested [facility] and I would agree with that.   Syncope:   ***  She had no further events.  No further workup.    {Are you ordering a CV Procedure (e.g. stress test, cath, DCCV, TEE, etc)?   Press F2        :  [id number]}  Follow up ***  Signed, Rollene Rotunda, MD

## 2022-12-31 ENCOUNTER — Ambulatory Visit: Payer: 59 | Admitting: Cardiology

## 2022-12-31 DIAGNOSIS — I2510 Atherosclerotic heart disease of native coronary artery without angina pectoris: Secondary | ICD-10-CM

## 2022-12-31 DIAGNOSIS — I639 Cerebral infarction, unspecified: Secondary | ICD-10-CM

## 2022-12-31 DIAGNOSIS — E118 Type 2 diabetes mellitus with unspecified complications: Secondary | ICD-10-CM

## 2022-12-31 DIAGNOSIS — E785 Hyperlipidemia, unspecified: Secondary | ICD-10-CM

## 2023-01-05 ENCOUNTER — Ambulatory Visit: Payer: 59 | Attending: Family Medicine

## 2023-01-05 NOTE — Therapy (Deleted)
OUTPATIENT SPEECH LANGUAGE PATHOLOGY TREATMENT   Patient Name: Karina Pierce MRN: [id number] DOB:February 18, 1956, 67 y.o., female Today's Date: 01/05/2023  PCP: Leilani Able, MDPCP - General  REFERRING PROVIDER: Alberteen Sam, MD (Ref Provider)   END OF SESSION:    Past Medical History:  Diagnosis Date   Diabetes mellitus without complication (HCC)    Hyperlipidemia    Past Surgical History:  Procedure Laterality Date   CARDIAC CATHETERIZATION     CORONARY STENT INTERVENTION N/A 12/02/2021   Procedure: CORONARY STENT INTERVENTION;  Surgeon: Runell Gess, MD;  Location: [facility];  Service: Cardiovascular;  Laterality: N/A;   KNEE SURGERY     LEFT HEART CATH AND CORONARY ANGIOGRAPHY N/A 12/02/2021   Procedure: LEFT HEART CATH AND CORONARY ANGIOGRAPHY;  Surgeon: Runell Gess, MD;  Location: [facility];  Service: Cardiovascular;  Laterality: N/A;   Patient Active Problem List   Diagnosis Date Noted   Obesity (BMI 30-39.9) 12/14/2022   Acute ischemic stroke (HCC) 12/13/2022   Depression 12/13/2022   Coronary artery disease involving native coronary artery of native heart without angina pectoris 03/12/2022   DM2 (diabetes mellitus, type 2) (HCC) 12/03/2021   HLD (hyperlipidemia)    Primary hypertension    NSTEMI (non-ST elevated myocardial infarction) (HCC) 12/01/2021   Chest pain 11/30/2021    ONSET DATE: 12/14/22   REFERRING DIAG:  R41.89 (ICD-10-CM) - Alteration in cognition   THERAPY DIAG: No diagnosis found.  Rationale for Evaluation and Treatment: Rehabilitation  SUBJECTIVE:   SUBJECTIVE STATEMENT: "So far so good."  Pt accompanied by: family member- daughter and granddaughter.   PERTINENT HISTORY: Pt is a 67 y.o. female who presented with expressive aphasia. MRI brain: small acute infarct in the right frontal cortex and subcortical white matter. PMH: anemia, type 2 diabetes mellitus   PAIN:  Are you having pain? No  FALLS: Has  patient fallen in last 6 months?  No  LIVING ENVIRONMENT: Lives with: lives with their family and lives with their daughter Lives in: House/apartment  PLOF:  Level of assistance: Independent with ADLs, Independent with IADLs Employment: Retired  PATIENT GOALS: "get better"   OBJECTIVE:   COGNITION: Overall cognitive status: Impaired (mild) Areas of impairment:  Attention: Impaired: Divided Memory: Impaired: Working Psychologist, educational function: Impaired: Problem solving and Slow processing Functional deficits: Improvements in cognition since hospitalization reported; however, daughter reported concern for reduced problem solving and delayed processing particularly as task complexity increases. Pt endorsed some occasional word finding as "words on tip of tongue." Mild reduced recall noted while completing PROM with intermittent reminders required to recall/attend to instructions.    COGNITIVE COMMUNICATION: Following directions: Follows multi-step commands inconsistently per daughter Auditory comprehension: Impaired: Daughter reported reduced auditory comprehension Verbal expression: Impaired: Wording finding, working Runner, broadcasting/film/video communication: WFL  ORAL MOTOR EXAMINATION: Overall status: WFL Comments: Observed to be within functional limits.   STANDARDIZED ASSESSMENTS: SLUMS: 27/27 (score may have been improved by prior conversation)  PATIENT REPORTED OUTCOME MEASURES (PROM): Cognitive Function: Patient rated 121/140 on cognitive function, states struggles mostly in word finding, memory,    /TODAY'S TREATMENT:  01/05/23:  12/17/22: Daughter states that patient struggled with two step directions after the stroke. Patient demonstrates and endorses limited awareness of deficits and looked to daughter to answer most questions accurately. Patient states  that some of these issues have happened before the stroke, but that she has difficulty finding words, remembering where she placed things. She is currently able to complete some household chores such as cooking, and experiences no memory mistakes with these actions as they are muscle memory to her. SLP educated Pt and daughter on role of ST, and patient endorses want/need for ST services at this time to address mild changes s/p stroke. All questions answered to patient satisfaction.    PATIENT EDUCATION: Education details: see above Person educated: Patient and Child(ren) Education method: Explanation Education comprehension: verbalized understanding and needs further education   GOALS: Goals reviewed with patient? Yes  LONG TERM GOALS: Target date: 01/28/2023 (STGs=LTGs)   Patient will utilize external memory aids with rare min A to reduce memory mistakes > 1 week  Baseline:  Goal status: IN PROGRESS  2.  Patient will teach back and utilize internal memory aids as needed to improve functional cognitive performance given rare min-A > 1 week  Baseline:  Goal status: IN PROGRESS  3.  Patient will teach back word retrieval strategies to address word finding with rare min-A  Baseline:  Goal status: IN PROGRESS  4.  Patient will engage in 30 min unstructured conversation, utilizing word finding strategies with rare min-A.   Baseline:  Goal status: IN PROGRESS   ASSESSMENT:  CLINICAL IMPRESSION: Patient is a 67 y.o. F who was seen today for acute ischemic stroke. Patient presented with mild cognitive communication impairment in relation to reduced recall, delayed processing, and occasional word finding. PROM collected revealed most significant changes for word finding and functional recall (losing items, forgetting items at store). Patient has deferred management of medication and appointments to daughter d/t vision changes, but is reportedly independent in the area of cooking and other  house hold chores. Cognitive assessment shows improvement when compared to discharge from inpatient treatment but endorsed some mild ongoing cognitive changes resulting in change in baseline functioning. ST is recommended at this time to increase independence on daily tasks and to improve patient quality of life.   OBJECTIVE IMPAIRMENTS: include attention, memory, and expressive language. These impairments are limiting patient from household responsibilities and effectively communicating at home and in community. Factors affecting potential to achieve goals and functional outcome are ability to learn/carryover information.. Patient will benefit from skilled SLP services to address above impairments and improve overall function.  REHAB POTENTIAL: Good  PLAN:  SLP FREQUENCY: 1x/week  SLP DURATION: 6 weeks (to account for scheduling)  PLANNED INTERVENTIONS: Functional tasks, SLP instruction and feedback, Compensatory strategies, and Patient/family education    Gracy Racer, CCC-SLP 01/05/2023, 7:51 AM

## 2023-01-12 ENCOUNTER — Ambulatory Visit: Payer: 59

## 2023-01-12 NOTE — Therapy (Deleted)
  Patient Name: Karina Pierce MRN: [id number] DOB:April 01, 1956, 67 y.o., female Today's Date: 01/12/2023  PCP: Leilani Able, MDPCP - General  REFERRING PROVIDER: Alberteen Sam, MD (Ref Provider)   SPEECH THERAPY DISCHARGE SUMMARY  Visits from Start of Care: 1  Current functional level related to goals / functional outcomes: Pt did not return for ST tx following evaluation. No update on progress.   Remaining deficits: Unknown    Education / Equipment: POC and goals  Patient agrees to discharge. Patient goals were not met. Patient is being discharged due to not returning since the last visit.   LONG TERM GOALS: Target date: 01/28/2023 (STGs=LTGs)   Patient will utilize external memory aids with rare min A to reduce memory mistakes > 1 week  Baseline:  Goal status: NOT MET  2.  Patient will teach back and utilize internal memory aids as needed to improve functional cognitive performance given rare min-A > 1 week  Baseline:  Goal status: NOT MET  3.  Patient will teach back word retrieval strategies to address word finding with rare min-A  Baseline:  Goal status: NOT MET  4.  Patient will engage in 30 min unstructured conversation, utilizing word finding strategies with rare min-A.   Baseline:  Goal status: NOT MET     [facility], Student-SLP 01/12/2023, 8:03 AM

## 2023-01-12 NOTE — Therapy (Signed)
[facility] [address] [place], [place], [address] Phone: [phone]   Fax:  [phone]  Patient Details  Name: Karina Pierce MRN: [id number] Date of Birth: 12-08-1955 Referring Provider:  Alberteen Sam, MD   Encounter Date: 01/12/2023  SPEECH THERAPY DISCHARGE SUMMARY   Visits from Start of Care: 1   Current functional level related to goals / functional outcomes: Pt did not return for ST tx following evaluation. No update on progress.   Remaining deficits: Unknown    Education / Equipment: POC and goals   Patient agrees to discharge. Patient goals were not met. Patient is being discharged due to not returning since the last visit.     LONG TERM GOALS: Target date: 01/28/2023 (STGs=LTGs)    Patient will utilize external memory aids with rare min A to reduce memory mistakes > 1 week  Baseline:  Goal status: NOT MET   2.  Patient will teach back and utilize internal memory aids as needed to improve functional cognitive performance given rare min-A > 1 week  Baseline:  Goal status: NOT MET   3.  Patient will teach back word retrieval strategies to address word finding with rare min-A  Baseline:  Goal status: NOT MET   4.  Patient will engage in 30 min unstructured conversation, utilizing word finding strategies with rare min-A.   Baseline:  Goal status: NOT MET  Gracy Racer, CCC-SLP 01/12/2023, 8:26 AM

## 2023-01-21 ENCOUNTER — Other Ambulatory Visit: Payer: Self-pay | Admitting: Cardiology

## 2023-03-18 ENCOUNTER — Ambulatory Visit: Payer: 59 | Attending: Physician Assistant | Admitting: Physician Assistant

## 2023-03-18 NOTE — Progress Notes (Deleted)
Cardiology Office Note:    Date:  03/18/2023  ID:  [name], DOB May 27, 1956, MRN [id number] PCP: Leilani Able, MD  [facility] Providers Cardiologist:  Rollene Rotunda, MD { Click to update primary MD,subspecialty MD or APP then REFRESH:1}    {Click to Open Review  :1}   Patient Profile:      Coronary artery disease NSTEMI 11/2021 - s/p 2.5 x 18 mm DES to RCA LHC 12/02/21: mLAD 30, pRCA 95  TTE 11/30/21: EF 60-65, no RWMA  R brain CVA  TTE 12/14/22: EF 55-60, no RWMA, NL RVSF, mild MR, AV Ca2+, mild AI, AV sclerosis, RAP 3, bubble study neg  Syncope  Monitor 12/2021: NSR, few SVT runs (longest 5 bts), no sustained arrhythmias Hypertension  Hyperlipidemia  Diabetes mellitus  Anemia  Former smoker         {      :1}   History of Present Illness:  Karina Pierce is a 67 y.o. female who returns for follow up of CAD. She was last seen in clinic by Bernadene Person, NP 10/09/22. She had intermittent chest pain and pt declined further testing or med adjustments. She preferred to monitor symptoms. She was admitted 6/15-6/16 with a small R frontal CVA. She presented with aphasia that resolved upon arrival to the ED. Workup was neg for large vessel occlusion.   Discussed the use of AI scribe software for clinical note transcription with the patient, who gave verbal consent to proceed.  History of Present Illness             ROS: ***    Studies Reviewed:       *** Risk Assessment/Calculations:   {Does this patient have ATRIAL FIBRILLATION?:[id number]} No BP recorded.  {Refresh Note OR Click here to enter BP  :1}***       Physical Exam:   VS:  There were no vitals taken for this visit.   Wt Readings from Last 3 Encounters:  10/09/22 235 lb 12.8 oz (107 kg)  09/11/22 244 lb (110.7 kg)  05/06/22 241 lb 6.5 oz (109.5 kg)    Physical Exam***     Assessment and Plan:  No problem-specific Assessment & Plan notes found for this encounter. Assessment and Plan             *** {       :1}    {Are you ordering a CV Procedure (e.g. stress test, cath, DCCV, TEE, etc)?   Press F2        :[id number]}  Dispo:  No follow-ups on file.  Signed, Tereso Newcomer, PA-C

## 2023-04-05 ENCOUNTER — Other Ambulatory Visit: Payer: Self-pay | Admitting: Cardiology

## 2023-04-09 NOTE — Progress Notes (Deleted)
Cardiology Office Note:   Date:  04/09/2023  ID:  Karina Pierce, DOB [birth year], MRN [id number] PCP: Leilani Able, MD  [facility] Providers Cardiologist:  Rollene Rotunda, MD {  History of Present Illness:   Karina Pierce is a 67 y.o. female with the above past medical history including CAD s/p DES-RCA, hypertension, hyperlipidemia, type 2 diabetes, anemia, and former tobacco use.  Since I last saw her she was in the hospital with a stroke.    ***     Stroke MRI brain showed small right frontal infarct - Non-invasive angiography showed no significant intracranial atherosclerosis - Echocardiogram showed no cardiogenic source of embolism - Carotid imaging unremarkable   - Lipids ordered: LDL greater than 140, Crestor dose increased - Aspirin ordered at admission, discharged on DAPT for 3 weeks followed by aspirin alone indefinitely - Atrial fibrillation: Not detected on monitoring - tPA not given because symptoms resolved - Dysphagia screen ordered in ER - PT eval ordered: Outpatient speech therapy, referral sent - Nonsmoker        Type 2 diabetes Hemoglobin A1c 9%.  Patient reported intermittent missed doses. - Refills of her home medicines were sent      She was hospitalized in June 2023 in the setting of non-STEMI s/p DES-RCA.  Echocardiogram showed EF 60 to 65%, normal LV function, mild MR, mildly elevated PASP of 39.8 mmHg.  She was last seen in the office on 09/11/2022 and was stable from a cardiac standpoint.  Since I last saw her    She presents today for follow-up accompanied by her daughter.  Since her last visit she has been stable from a cardiac standpoint. Approximately 2 weeks ago she noted multiple episodes of intermittent chest discomfort that occurred both at rest and with activity, not similar to prior anginal equivalent.  She denies any other associated symptoms, denies dyspnea, edema, PND, orthopnea, weight gain.  She denies any further chest pain.  ROS:  ***  Studies Reviewed:    EKG:       ***  Risk Assessment/Calculations:   {Does this patient have ATRIAL FIBRILLATION?:[phone]} No BP recorded.  {Refresh Note OR Click here to enter BP  :1}***        Physical Exam:   VS:  There were no vitals taken for this visit.   Wt Readings from Last 3 Encounters:  10/09/22 235 lb 12.8 oz (107 kg)  09/11/22 244 lb (110.7 kg)  05/06/22 241 lb 6.5 oz (109.5 kg)     GEN: Well nourished, well developed in no acute distress NECK: No JVD; No carotid bruits CARDIAC: ***RRR, no murmurs, rubs, gallops RESPIRATORY:  Clear to auscultation without rales, wheezing or rhonchi  ABDOMEN: Soft, non-tender, non-distended EXTREMITIES:  No edema; No deformity   ASSESSMENT AND PLAN:   CAD: S/p DES-RCA in 11/2021. :  ***  Approximately 2 weeks ago she had multiple episodes of intermittent chest discomfort that occurred both at rest and with activity, prior anginal equivalent.  Denies any associated symptoms.  She denies any further chest pain. We discussed possible trial of Imdur, possible stress test, patient prefers to continue to monitor symptoms for now.  If symptoms persist, consider trial low-dose Imdur versus cardiac PET stress test.  Provided refill of nitroglycerin.  Discussed ED precautions.  Continue aspirin, Plavix, metoprolol, Crestor.   Hypertension:   ***  BP well controlled. Continue current antihypertensive regimen.    Hyperlipidemia:    ***  LDL was 171 in 08/2022. She has  not been taking her statin consistently.  Encouraged adherence to statin therapy. Referred to lipid clinic pharmD, pending appointment.  Continue Crestor.   Type 2 diabetes::   A1c was ***  8.6 in 08/2022.  Monitored and managed per PCP        {Are you ordering a CV Procedure (e.g. stress test, cath, DCCV, TEE, etc)?   Press F2        :[id number]}  Follow up ***  Signed, Rollene Rotunda, MD

## 2023-04-10 ENCOUNTER — Ambulatory Visit: Payer: 59 | Attending: Cardiology | Admitting: Cardiology

## 2023-04-10 DIAGNOSIS — E118 Type 2 diabetes mellitus with unspecified complications: Secondary | ICD-10-CM

## 2023-04-10 DIAGNOSIS — I10 Essential (primary) hypertension: Secondary | ICD-10-CM

## 2023-04-10 DIAGNOSIS — E785 Hyperlipidemia, unspecified: Secondary | ICD-10-CM

## 2023-04-10 DIAGNOSIS — I2510 Atherosclerotic heart disease of native coronary artery without angina pectoris: Secondary | ICD-10-CM

## 2023-06-14 ENCOUNTER — Other Ambulatory Visit: Payer: Self-pay | Admitting: Cardiology

## 2023-07-09 NOTE — Progress Notes (Deleted)
  Cardiology Office Note:   Date:  07/09/2023  ID:  Karina Pierce, DOB [birth year], MRN [id number] PCP: Karina Crigler, MD  [facility] Providers Cardiologist:  Karina Schilling, MD {  History of Present Illness:   Karina Pierce is a 68 y.o. female who presents for non-STEMI with DES to the RCA.  This happened in June.  She presented initially with syncope.  She has a history of CAD with NSTEMI . hypertension, hyperlipidemia, type 2 diabetes mellitus, anemia, and former tobacco abuse.  Chest CTA was negative for PE but did show areas of ground glass attenuation and mild septal thickening bilaterally which could suggest mild interstitial edema or possible interstitial lung disease. Echo showed LVEF of 60-65% with normal wall motion, mild MR, and mildly elevated PASP of 39.8 mmHg. LHC on 6/5 showed 95% stenosis of proximal to mid RCA and 30% stenosis of mid LAD. She underwent successful PCI with DES to RCA. She initially started on DAPT with Aspirin  and Brilinta  but developed significant dyspnea so was transitioned to Plavix  prior to discharge. She was also started on a beta-blocker and high-intensity statin.   Since she was last seen she was in the hospital in June with CVA with a small right frontal infarct.  Echo and carotid did not demonstrate an etiology.  I reviewed these records for this appt.  *** ***   ***  she has done well.  She does not exercise very much because she has vision problems.  She has none of the symptoms that prompted her evaluation. The patient denies any new symptoms such as chest discomfort, neck or arm discomfort. There has been no new shortness of breath, PND or orthopnea. There have been no reported palpitations, presyncope or syncope.   ROS: ***  Studies Reviewed:    EKG:       ***  Risk Assessment/Calculations:   {Does this patient have ATRIAL FIBRILLATION?:[phone]} No BP recorded.  {Refresh Note OR Click here to enter BP  :1}***        Physical Exam:   VS:   There were no vitals taken for this visit.   Wt Readings from Last 3 Encounters:  10/09/22 235 lb 12.8 oz (107 kg)  09/11/22 244 lb (110.7 kg)  05/06/22 241 lb 6.5 oz (109.5 kg)     GEN: Well nourished, well developed in no acute distress NECK: No JVD; No carotid bruits CARDIAC: ***RR, *** murmurs, rubs, gallops RESPIRATORY:  Clear to auscultation without rales, wheezing or rhonchi  ABDOMEN: Soft, non-tender, non-distended EXTREMITIES:  No edema; No deformity   ASSESSMENT AND PLAN:   CAD:  ***  The patient has no new sypmtoms.  No further cardiovascular testing is indicated.  We will continue with aggressive risk reduction and meds as listed.she can stop the Plavix  in June.   Dyslipidemia:   LDL was ***  previously 143.  I will check a lipid profile and LP(a) today with a goal LDL in the 70s.  We talked about a plant-based diet.  She might need a PCSK9 inhibitor or at least the addition of Zetia.     DM: Her A1c was *** 10.  I will check an A1c.  Her primary provider suggested Mounjaro and I would agree with that.   Syncope: ***  She had no further events.  No further workup.  CVA:  ***        Follow up ***  Signed, Karina Schilling, MD

## 2023-07-10 ENCOUNTER — Ambulatory Visit: Payer: Medicaid Other | Admitting: Cardiology

## 2023-07-19 NOTE — Progress Notes (Deleted)
Cardiology Office Note   Date:  07/19/2023   ID:  Karina Pierce, DOB 30-Jan-1956, MRN [id number]  PCP:  Karina Able, MD  Cardiologist:   Rollene Rotunda, MD Referring:  ***  No chief complaint on file.     History of Present Illness: Karina Pierce is a 68 y.o. female who presents for with the above past medical history including CAD s/p DES-RCA, hypertension, hyperlipidemia, type 2 diabetes, anemia, and former tobacco use.   She was hospitalized in June 2023 in the setting of non-STEMI s/p DES-RCA.  Echocardiogram showed EF 60 to 65%, normal LV function, mild MR, mildly elevated PASP of 39.8 mmHg.  She was last seen in the office on 09/11/2022 and was stable from a cardiac standpoint.   She presents for follow up.  ***      ***   She presents today for follow-up accompanied by her daughter.  Since her last visit she has been stable from a cardiac standpoint. Approximately 2 weeks ago she noted multiple episodes of intermittent chest discomfort that occurred both at rest and with activity, not similar to prior anginal equivalent.  She denies any other associated symptoms, denies dyspnea, edema, PND, orthopnea, weight gain.  She denies any further chest pain.       Past Medical History:  Diagnosis Date   Diabetes mellitus without complication (HCC)    Hyperlipidemia     Past Surgical History:  Procedure Laterality Date   CARDIAC CATHETERIZATION     CORONARY STENT INTERVENTION N/A 12/02/2021   Procedure: CORONARY STENT INTERVENTION;  Surgeon: Runell Gess, MD;  Location: [facility] INVASIVE [facility];  Service: Cardiovascular;  Laterality: N/A;   KNEE SURGERY     LEFT HEART CATH AND CORONARY ANGIOGRAPHY N/A 12/02/2021   Procedure: LEFT HEART CATH AND CORONARY ANGIOGRAPHY;  Surgeon: Runell Gess, MD;  Location: [facility] INVASIVE CV LAB;  Service: Cardiovascular;  Laterality: N/A;     Current Outpatient Medications  Medication Sig Dispense Refill   acetaminophen (TYLENOL) 500 MG tablet Take  1 tablet (500 mg total) by mouth every 6 (six) hours as needed. (Patient taking differently: Take 500 mg by mouth every 6 (six) hours as needed for moderate pain.) 30 tablet 0   aspirin EC 81 MG tablet Take 1 tablet (81 mg total) by mouth daily. Swallow whole. 30 tablet 11   clopidogrel (PLAVIX) 75 MG tablet TAKE 1 TABLET BY MOUTH DAILY 100 tablet 2   escitalopram (LEXAPRO) 10 MG tablet Take 10 mg by mouth daily.     FARXIGA 10 MG TABS tablet Take 1 tablet (10 mg total) by mouth daily. 30 tablet 3   glipiZIDE (GLUCOTROL XL) 10 MG 24 hr tablet Take 1 tablet (10 mg total) by mouth daily with breakfast. 30 tablet 3   metFORMIN (GLUCOPHAGE) 500 MG tablet Take 1 tablet (500 mg total) by mouth See admin instructions. Pt takes every 3 days 10 tablet 3   metoprolol tartrate (LOPRESSOR) 25 MG tablet TAKE ONE-HALF TABLET BY MOUTH  TWICE DAILY 100 tablet 0   MOUNJARO 2.5 MG/0.5ML Pen Inject 2.5 mg into the skin once a week.     nitroGLYCERIN (NITROSTAT) 0.4 MG SL tablet Place 1 tablet (0.4 mg total) under the tongue every 5 (five) minutes x 3 doses as needed for chest pain. 25 tablet 2   rosuvastatin (CRESTOR) 40 MG tablet Take 1 tablet (40 mg total) by mouth daily at 6 PM. 30 tablet 3   traMADol (ULTRAM) 50  MG tablet Take 50 mg by mouth every 6 (six) hours as needed for moderate pain.     No current facility-administered medications for this visit.    Allergies:   Patient has no known allergies.    Social History:  The patient  reports that she has quit smoking. She has never used smokeless tobacco. She reports that she does not drink alcohol and does not use drugs.   Family History:  The patient's ***family history includes CAD in her mother.    ROS:  Please see the history of present illness.   Otherwise, review of systems are positive for {NONE DEFAULTED:18576}.   All other systems are reviewed and negative.    PHYSICAL EXAM: VS:  There were no vitals taken for this visit. , BMI There is no  height or weight on file to calculate BMI. GENERAL:  Well appearing HEENT:  Pupils equal round and reactive, fundi not visualized, oral mucosa unremarkable NECK:  No jugular venous distention, waveform within normal limits, carotid upstroke brisk and symmetric, no bruits, no thyromegaly LYMPHATICS:  No cervical, inguinal adenopathy LUNGS:  Clear to auscultation bilaterally BACK:  No CVA tenderness CHEST:  Unremarkable HEART:  PMI not displaced or sustained,S1 and S2 within normal limits, no S3, no S4, no clicks, no rubs, *** murmurs ABD:  Flat, positive bowel sounds normal in frequency in pitch, no bruits, no rebound, no guarding, no midline pulsatile mass, no hepatomegaly, no splenomegaly EXT:  2 plus pulses throughout, no edema, no cyanosis no clubbing SKIN:  No rashes no nodules NEURO:  Cranial nerves II through XII grossly intact, motor grossly intact throughout PSYCH:  Cognitively intact, oriented to person place and time    EKG:        Recent Labs: 12/14/2022: ALT 13; BUN 14; Creatinine, Ser 0.96; Hemoglobin 11.8; Magnesium 1.9; Platelets 363; Potassium 3.7; Sodium 135    Lipid Panel    Component Value Date/Time   CHOL 233 (H) 12/14/2022 0405   CHOL 256 (H) 09/11/2022 0832   TRIG 145 12/14/2022 0405   HDL 46 12/14/2022 0405   HDL 62 09/11/2022 0832   CHOLHDL 5.1 12/14/2022 0405   VLDL 29 12/14/2022 0405   LDLCALC 158 (H) 12/14/2022 0405   LDLCALC 171 (H) 09/11/2022 0832      Wt Readings from Last 3 Encounters:  10/09/22 235 lb 12.8 oz (107 kg)  09/11/22 244 lb (110.7 kg)  05/06/22 241 lb 6.5 oz (109.5 kg)      Other studies Reviewed: Additional studies/ records that were reviewed today include: ***. Review of the above records demonstrates:  Please see elsewhere in the note.  ***   ASSESSMENT AND PLAN:   CAD:   ****   S/p DES-RCA in 11/2021.  Approximately 2 weeks ago she had multiple episodes of intermittent chest discomfort that occurred both at rest and  with activity, prior anginal equivalent.  Denies any associated symptoms.  She denies any further chest pain. We discussed possible trial of Imdur, possible stress test, patient prefers to continue to monitor symptoms for now.  If symptoms persist, consider trial low-dose Imdur versus cardiac PET stress test.  Provided refill of nitroglycerin.  Discussed ED precautions.  Continue aspirin, Plavix, metoprolol, Crestor.   Hypertension:   ***    BP well controlled. Continue current antihypertensive regimen.    Hyperlipidemia:  ****   LDL was 171 in 08/2022. She has not been taking her statin consistently.  Encouraged adherence to statin therapy. Referred  to lipid clinic pharmD, pending appointment.  Continue Crestor.   Type 2 diabetes: A1c was ***   8.6 in 08/2022.  Monitored and managed per PCP.   Disposition: ***   Follow-up in 3 to 5 months with Dr. Antoine Poche.   Current medicines are reviewed at length with the patient today.  The patient {ACTIONS; HAS/DOES NOT HAVE:19233} concerns regarding medicines.  The following changes have been made:  {PLAN; NO CHANGE:13088:s}  Labs/ tests ordered today include: *** No orders of the defined types were placed in this encounter.    Disposition:   FU with ***    Signed, Rollene Rotunda, MD  07/19/2023 4:11 PM    [facility]

## 2023-07-21 ENCOUNTER — Ambulatory Visit: Payer: Medicaid Other | Admitting: Cardiology

## 2023-07-21 DIAGNOSIS — I2510 Atherosclerotic heart disease of native coronary artery without angina pectoris: Secondary | ICD-10-CM

## 2023-07-21 DIAGNOSIS — I10 Essential (primary) hypertension: Secondary | ICD-10-CM

## 2023-07-21 DIAGNOSIS — E118 Type 2 diabetes mellitus with unspecified complications: Secondary | ICD-10-CM

## 2023-07-22 ENCOUNTER — Ambulatory Visit: Payer: 59 | Attending: Cardiology | Admitting: Cardiology

## 2023-07-22 ENCOUNTER — Encounter: Payer: Self-pay | Admitting: Cardiology

## 2023-07-22 VITALS — BP 128/74 | HR 83 | Ht 69.0 in | Wt 224.0 lb

## 2023-07-22 DIAGNOSIS — I10 Essential (primary) hypertension: Secondary | ICD-10-CM

## 2023-07-22 DIAGNOSIS — E118 Type 2 diabetes mellitus with unspecified complications: Secondary | ICD-10-CM

## 2023-07-22 DIAGNOSIS — I2510 Atherosclerotic heart disease of native coronary artery without angina pectoris: Secondary | ICD-10-CM | POA: Diagnosis not present

## 2023-07-22 MED ORDER — ROSUVASTATIN CALCIUM 40 MG PO TABS: 40.0000 mg | ORAL_TABLET | Freq: Every day | ORAL | 3 refills | Status: DC

## 2023-07-22 MED ORDER — METOPROLOL SUCCINATE ER 25 MG PO TB24: 25.0000 mg | ORAL_TABLET | Freq: Every day | ORAL | 3 refills | Status: AC

## 2023-07-22 MED ORDER — NITROGLYCERIN 0.4 MG SL SUBL: 0.4000 mg | SUBLINGUAL_TABLET | SUBLINGUAL | 2 refills | Status: DC | PRN

## 2023-07-22 MED ORDER — ASPIRIN 81 MG PO TBEC: 81.0000 mg | DELAYED_RELEASE_TABLET | Freq: Every day | ORAL | Status: AC

## 2023-07-22 NOTE — Patient Instructions (Signed)
Medication Instructions:  Scripts sent to [facility] for Toprol XL 25 mg by mouth daily, rosuvastatin 40 mg by mouth at night, nitroglycerin as directed.   *If you need a refill on your cardiac medications before your next appointment, please call your pharmacy*   Follow-Up: At [facility], you and your health needs are our priority.  As part of our continuing mission to provide you with exceptional heart care, we have created designated Provider Care Teams.  These Care Teams include your primary Cardiologist (physician) and Advanced Practice Providers (APPs -  Physician Assistants and Nurse Practitioners) who all work together to provide you with the care you need, when you need it.  We recommend signing up for the patient portal called "MyChart".  Sign up information is provided on this After Visit Summary.  MyChart is used to connect with patients for Virtual Visits (Telemedicine).  Patients are able to view lab/test results, encounter notes, upcoming appointments, etc.  Non-urgent messages can be sent to your provider as well.   To learn more about what you can do with MyChart, go to [email or web address].    Your next appointment:   1 month(s)  Provider:   First available APP.

## 2023-07-22 NOTE — Progress Notes (Signed)
Cardiology Office Note:   Date:  07/22/2023  ID:  Camila Boscia, DOB 07-01-1955, MRN [id number] PCP: Leilani Able, MD  [facility] Providers Cardiologist:  Rollene Rotunda, MD {  History of Present Illness:   Karina Pierce is a 68 y.o. female who presents for non-STEMI with DES to the RCA.  This happened in June.  She presented initially with syncope.  She has a history of CAD with NSTEMI . hypertension, hyperlipidemia, type 2 diabetes mellitus, anemia, and former tobacco abuse.  Chest CTA was negative for PE but did show areas of ground glass attenuation and mild septal thickening bilaterally which could suggest mild interstitial edema or possible interstitial lung disease. Echo showed LVEF of 60-65% with normal wall motion, mild MR, and mildly elevated PASP of 39.8 mmHg. LHC on 6/5 showed 95% stenosis of proximal to mid RCA and 30% stenosis of mid LAD. She underwent successful PCI with DES to RCA. She initially started on DAPT with Aspirin and Brilinta but developed significant dyspnea so was transitioned to Plavix prior to discharge. She was also started on a beta-blocker and high-intensity statin.   Since she was last seen she was in the hospital in June 2024 with a TIA.  I reviewed these records for this visit.  She had an echocardiogram demonstrating no source for embolism.  Crestor was increased because her LDL was 140.  She was treated with DAPT for 3 weeks and then transition to aspirin.  Carotid imaging was unremarkable.  MRI showed a small right frontal infarct.  She returns for follow-up.     She is having significant back pain and is actually going to see somebody about this.  We can walk with a walker and very limiting her exercise.  Because she is so preoccupied with this and wondering if it was interfering with some of her pain meds she stopped taking all of her medications apparently.  She has been getting some chest discomfort.  She says this happens sporadically.  Her some mid chest  discomfort might last for 10 minutes at a time.  She will get some arm discomfort.  She does not think it feels like her previous angina.  That was very intense and this is somewhat moderate.  She is not describing associated nausea vomiting or diaphoresis.  Comes on at rest.  ROS: As stated in the HPI and negative for all other systems.  Studies Reviewed:    EKG:   EKG Interpretation Date/Time:  Wednesday July 22 2023 13:23:45 EST Ventricular Rate:  83 PR Interval:  134 QRS Duration:  88 QT Interval:  380 QTC Calculation: 446 R Axis:   -2  Text Interpretation: Normal sinus rhythm Nonspecific T wave abnormality When compared with ECG of 13-Dec-2022 18:49, No significant change since last tracing Confirmed by Rollene Rotunda ([id number]) on 07/22/2023 1:27:56 PM    Risk Assessment/Calculations:              Physical Exam:   VS:  BP 128/74 (BP Location: Left Arm, Patient Position: Sitting, Cuff Size: Normal)   Pulse 83   Ht 5\' 9"  (1.753 m)   Wt 224 lb (101.6 kg)   BMI 33.08 kg/m    Wt Readings from Last 3 Encounters:  07/22/23 224 lb (101.6 kg)  10/09/22 235 lb 12.8 oz (107 kg)  09/11/22 244 lb (110.7 kg)     GEN: Well nourished, well developed in no acute distress NECK: No JVD; No carotid bruits CARDIAC: RRR, no murmurs, rubs,  gallops RESPIRATORY:  Clear to auscultation without rales, wheezing or rhonchi  ABDOMEN: Soft, non-tender, non-distended EXTREMITIES:  No edema; No deformity   ASSESSMENT AND PLAN:   CAD: S/p DES-RCA in 11/2021.     The patient is having some chest discomfort but she is off all her medications.  I think I convinced her to go back on her beta-blocker and I will start this at Toprol XL 25 mg daily and also taking aspirin 81 mg daily.  She is to present to the emergency room if she has any increasing unstable symptoms.  Otherwise we will see her back in a couple of weeks to see if her symptoms have resolved.  Right now she says she could not lay on the table  to get a stress test or heart catheterization.    Hypertension: BP is controlled but I will be starting the beta-blocker as above.   Hyperlipidemia:   I think I have convinced her to go back on her Crestor.  We will rewrite this prescription.   Type 2 diabetes: A1c she does not want to take the [facility] that has been prescribed and apparently has not been taking the other meds.  I have asked her to get back with her primary provider about this.  Her last A1c was 9.0.  She needs close follow-up and adherence to her meds.     Follow up with APP in two weeks.   Signed, Rollene Rotunda, MD

## 2023-08-14 ENCOUNTER — Emergency Department (HOSPITAL_COMMUNITY): Payer: 59

## 2023-08-14 ENCOUNTER — Emergency Department (HOSPITAL_COMMUNITY)
Admission: EM | Admit: 2023-08-14 | Discharge: 2023-08-15 | Disposition: A | Discharge status: Home / Self Care | Payer: 59 | Attending: Emergency Medicine | Admitting: Emergency Medicine

## 2023-08-14 ENCOUNTER — Other Ambulatory Visit: Payer: Self-pay

## 2023-08-14 ENCOUNTER — Encounter (HOSPITAL_COMMUNITY): Payer: Self-pay

## 2023-08-14 DIAGNOSIS — Z7984 Long term (current) use of oral hypoglycemic drugs: Secondary | ICD-10-CM | POA: Diagnosis not present

## 2023-08-14 DIAGNOSIS — E785 Hyperlipidemia, unspecified: Secondary | ICD-10-CM | POA: Insufficient documentation

## 2023-08-14 DIAGNOSIS — R410 Disorientation, unspecified: Secondary | ICD-10-CM | POA: Insufficient documentation

## 2023-08-14 DIAGNOSIS — Z79899 Other long term (current) drug therapy: Secondary | ICD-10-CM | POA: Insufficient documentation

## 2023-08-14 DIAGNOSIS — E1165 Type 2 diabetes mellitus with hyperglycemia: Secondary | ICD-10-CM | POA: Diagnosis not present

## 2023-08-14 DIAGNOSIS — I700 Atherosclerosis of aorta: Secondary | ICD-10-CM | POA: Diagnosis not present

## 2023-08-14 DIAGNOSIS — Z7982 Long term (current) use of aspirin: Secondary | ICD-10-CM | POA: Insufficient documentation

## 2023-08-14 DIAGNOSIS — Z7985 Long-term (current) use of injectable non-insulin antidiabetic drugs: Secondary | ICD-10-CM | POA: Diagnosis not present

## 2023-08-14 DIAGNOSIS — Z8673 Personal history of transient ischemic attack (TIA), and cerebral infarction without residual deficits: Secondary | ICD-10-CM | POA: Diagnosis not present

## 2023-08-14 DIAGNOSIS — Z20822 Contact with and (suspected) exposure to covid-19: Secondary | ICD-10-CM | POA: Insufficient documentation

## 2023-08-14 DIAGNOSIS — R1031 Right lower quadrant pain: Secondary | ICD-10-CM | POA: Insufficient documentation

## 2023-08-14 LAB — CBC
HCT: 43.9 % (ref 36.0–46.0)
Hemoglobin: 13.8 g/dL (ref 12.0–15.0)
MCH: 26.7 pg (ref 26.0–34.0)
MCHC: 31.4 g/dL (ref 30.0–36.0)
MCV: 85.1 fL (ref 80.0–100.0)
Platelets: 381 10*3/uL (ref 150–400)
RBC: 5.16 MIL/uL — ABNORMAL HIGH (ref 3.87–5.11)
RDW: 12.5 % (ref 11.5–15.5)
WBC: 7.1 10*3/uL (ref 4.0–10.5)
nRBC: 0 % (ref 0.0–0.2)

## 2023-08-14 LAB — CBG MONITORING, ED: Glucose-Capillary: 333 mg/dL — ABNORMAL HIGH (ref 70–99)

## 2023-08-14 MED ORDER — FENTANYL CITRATE PF 50 MCG/ML IJ SOSY
50.0000 ug | PREFILLED_SYRINGE | Freq: Once | INTRAMUSCULAR | Status: AC
  Administered 2023-08-14: 50 ug via INTRAVENOUS
  Filled 2023-08-14: qty 1

## 2023-08-14 MED ORDER — ONDANSETRON HCL 4 MG/2ML IJ SOLN
4.0000 mg | Freq: Once | INTRAMUSCULAR | Status: AC
  Administered 2023-08-14: 4 mg via INTRAVENOUS
  Filled 2023-08-14: qty 2

## 2023-08-14 NOTE — ED Provider Triage Note (Signed)
Emergency Medicine Provider Triage Evaluation Note  Karina Pierce , a 68 y.o. female  was evaluated in triage.  Pt complains of abdominal pain, worse on the right side with nausea but no vomiting, also complains of an episode of expressive aphasia which occurred approximate 45 minutes prior to arrival in which has completely subsided.  At this time patient has an NIH of 0.  Patient does have history of stroke with similar "word salad", not currently on antiplatelet/anticoagulation.  Review of Systems  Positive:  Negative:   Physical Exam  BP 130/71 (BP Location: Left Arm)   Pulse (!) 102   Temp 100.1 F (37.8 C) (Oral)   Resp 18   Ht 5\' 9"  (1.753 m)   Wt 102.1 kg   SpO2 99%   BMI 33.23 kg/m  Gen:   Awake, no distress   Resp:  Normal effort  MSK:   Moves extremities without difficulty  Other:    Medical Decision Making  Medically screening exam initiated at 10:51 PM.  Appropriate orders placed.  Karina Pierce was informed that the remainder of the evaluation will be completed by another provider, this initial triage assessment does not replace that evaluation, and the importance of remaining in the ED until their evaluation is complete.     Darrick Grinder, PA-C 08/14/23 2252

## 2023-08-14 NOTE — ED Triage Notes (Signed)
Pt presents to ED with c/o abdominal pain, neck pain radiating, head ache, nausea, and confusion started today.  Feels week all over. Denies V/D. Hx of stroke.  Daughter of pt stated she started talking gibberish  about 45 mins ago and last time this happened she had a stroke. Passed Neuro exam. Face symmetrical.  A/O x4 during assessment.

## 2023-08-15 ENCOUNTER — Emergency Department (HOSPITAL_COMMUNITY): Payer: 59

## 2023-08-15 DIAGNOSIS — R1031 Right lower quadrant pain: Secondary | ICD-10-CM | POA: Diagnosis not present

## 2023-08-15 LAB — COMPREHENSIVE METABOLIC PANEL
ALT: 26 U/L (ref 0–44)
AST: 27 U/L (ref 15–41)
Albumin: 3.4 g/dL — ABNORMAL LOW (ref 3.5–5.0)
Alkaline Phosphatase: 104 U/L (ref 38–126)
Anion gap: 9 (ref 5–15)
BUN: 12 mg/dL (ref 8–23)
CO2: 23 mmol/L (ref 22–32)
Calcium: 9.1 mg/dL (ref 8.9–10.3)
Chloride: 103 mmol/L (ref 98–111)
Creatinine, Ser: 0.81 mg/dL (ref 0.44–1.00)
GFR, Estimated: >60 mL/min (ref >60)
Glucose, Bld: 301 mg/dL — ABNORMAL HIGH (ref 70–99)
Potassium: 4.6 mmol/L (ref 3.5–5.1)
Sodium: 135 mmol/L (ref 135–145)
Total Bilirubin: 1.4 mg/dL — ABNORMAL HIGH (ref 0.0–1.2)
Total Protein: 7.2 g/dL (ref 6.5–8.1)

## 2023-08-15 LAB — RESP PANEL BY RT-PCR (RSV, FLU A&B, COVID)  RVPGX2
Influenza A by PCR: NEGATIVE
Influenza B by PCR: NEGATIVE
Resp Syncytial Virus by PCR: NEGATIVE
SARS Coronavirus 2 by RT PCR: NEGATIVE

## 2023-08-15 LAB — LIPASE, BLOOD: Lipase: 22 U/L (ref 11–51)

## 2023-08-15 MED ORDER — HYDROCODONE-ACETAMINOPHEN 5-325 MG PO TABS: 1.0000 | ORAL_TABLET | Freq: Four times a day (QID) | ORAL | 0 refills | Status: AC | PRN

## 2023-08-15 MED ORDER — IOHEXOL 300 MG/ML  SOLN
100.0000 mL | Freq: Once | INTRAMUSCULAR | Status: AC | PRN
  Administered 2023-08-15: 100 mL via INTRAVENOUS

## 2023-08-15 NOTE — Discharge Instructions (Signed)
Your caregiver has seen you today because you are having problems with feelings of weakness, dizziness, and/or fatigue. Weakness has many different causes, some of which are common and others are very rare. Your caregiver has considered some of the most common causes of weakness and feels it is safe for you to go home and be observed. Not every illness or injury can be identified during an emergency department visit, thus follow-up with your primary healthcare provider is important. Medical conditions can also worsen, so it is also important to return immediately as directed below, or if you have other serious concerns develop. ° °RETURN IMMEDIATELY IF  °you develop new shortness of breath, chest pain, fever, have difficulty moving parts of your body (new weakness, numbness, or incoordination), sudden change in speech, vision, swallowing, or understanding, faint or develop new dizziness, severe headache, become poorly responsive or have an altered mental status compared to baseline for you, new rash, abdominal pain, or bloody stools,  °Return sooner also if you develop new problems for which you have not talked to your caregiver but you feel may be emergency medical conditions. ° °

## 2023-08-15 NOTE — ED Notes (Signed)
 Patient transported to CT

## 2023-08-15 NOTE — ED Provider Notes (Signed)
 [place] EMERGENCY DEPARTMENT AT [facility] Provider Note   CSN: [id number] Arrival date & time: 08/14/23  2202     History  Chief Complaint  Patient presents with   Altered Mental Status   Abdominal Pain    Karina Pierce is a 68 y.o. female.  The history is provided by the patient and a relative.  Patient history of diabetes, hyperlipidemia, previous TIA/CVA presents for multiple complaints.  Patient reports over the past days she has had right lower quadrant abdominal pain with nausea but no vomiting.  Also reports constipation.  No fevers or vomiting. No chest pain, no cough.  She has had a mild headache.  It is reported that patient took a nap and when she woke up she had difficulty speaking for around 45 minutes which has since subsided.  No focal weakness.  Family is concerned because she has had a previous history of TIA/CVA   Patient denies previous abdominal surgery Past Medical History:  Diagnosis Date   Diabetes mellitus without complication (HCC)    Hyperlipidemia     Home Medications Prior to Admission medications   Medication Sig Start Date End Date Taking? Authorizing Provider  HYDROcodone-acetaminophen (NORCO/VICODIN) 5-325 MG tablet Take 1 tablet by mouth every 6 (six) hours as needed for severe pain (pain score 7-10). 08/15/23  Yes Zadie Rhine, MD  acetaminophen (TYLENOL) 500 MG tablet Take 1 tablet (500 mg total) by mouth every 6 (six) hours as needed. Patient taking differently: Take 500 mg by mouth every 6 (six) hours as needed for moderate pain (pain score 4-6). 10/18/20   Lorelee New, PA-C  aspirin EC 81 MG tablet Take 1 tablet (81 mg total) by mouth daily. Swallow whole. 07/22/23 07/21/24  Rollene Rotunda, MD  clopidogrel (PLAVIX) 75 MG tablet TAKE 1 TABLET BY MOUTH DAILY Patient not taking: Reported on 07/22/2023 01/21/23   Rollene Rotunda, MD  escitalopram (LEXAPRO) 10 MG tablet Take 10 mg by mouth daily. As needed 03/02/22   [provider]  FARXIGA 10 MG TABS tablet Take 1 tablet (10 mg total) by mouth daily. Patient not taking: Reported on 07/22/2023 12/14/22   Alberteen Sam, MD  glipiZIDE (GLUCOTROL XL) 10 MG 24 hr tablet Take 1 tablet (10 mg total) by mouth daily with breakfast. Patient not taking: Reported on 07/22/2023 12/14/22   Alberteen Sam, MD  metFORMIN (GLUCOPHAGE) 500 MG tablet Take 1 tablet (500 mg total) by mouth See admin instructions. Pt takes every 3 days Patient not taking: Reported on 07/22/2023 12/14/22   Alberteen Sam, MD  metoprolol succinate (TOPROL XL) 25 MG 24 hr tablet Take 1 tablet (25 mg total) by mouth daily. 07/22/23   Rollene Rotunda, MD  MOUNJARO 2.5 MG/0.5ML Pen Inject 2.5 mg into the skin once a week. Patient not taking: Reported on 07/22/2023 09/11/22   [provider]  nitroGLYCERIN (NITROSTAT) 0.4 MG SL tablet Place 1 tablet (0.4 mg total) under the tongue every 5 (five) minutes x 3 doses as needed for chest pain. 07/22/23   Rollene Rotunda, MD  rosuvastatin (CRESTOR) 40 MG tablet Take 1 tablet (40 mg total) by mouth daily at 6 PM. 07/22/23   Rollene Rotunda, MD      Allergies    Patient has no known allergies.    Review of Systems   Review of Systems  Constitutional:  Negative for fever.  Respiratory:  Negative for cough.   Gastrointestinal:  Positive for abdominal pain, constipation and  nausea. Negative for diarrhea and vomiting.  Genitourinary:  Negative for difficulty urinating and dysuria.  Neurological:  Positive for speech difficulty and headaches. Negative for weakness.    Physical Exam Updated Vital Signs BP 127/69   Pulse 90   Temp 100.1 F (37.8 C) (Oral)   Resp 19   Ht 1.753 m (5\' 9" )   Wt 102.1 kg   SpO2 98%   BMI 33.23 kg/m  Physical Exam CONSTITUTIONAL: Elderly, no acute distress HEAD: Normocephalic/atraumatic EYES: EOMI/PERRL, no nystagmus,  no ptosis ENMT: Mucous membranes moist NECK: supple no meningeal signs CV:  S1/S2 noted, no murmurs/rubs/gallops noted LUNGS: Lungs are clear to auscultation bilaterally, no apparent distress ABDOMEN: soft, moderate right lower quadrant tenderness, no rebound or guarding GU:no cva tenderness NEURO:Awake/alert, face symmetric, no arm or leg drift is noted Equal 5/5 strength with shoulder abduction, elbow flex/extension, wrist flex/extension in upper extremities and equal hand grips bilaterally Equal 5/5 strength with hip flexion,knee flex/extension, foot dorsi/plantar flexion Cranial nerves 3/4/5/6/01/05/09/11/12 tested and intact EXTREMITIES: pulses normal, full ROM SKIN: warm, color normal PSYCH: no abnormalities of mood noted  ED Results / Procedures / Treatments   Labs (all labs ordered are listed, but only abnormal results are displayed) Labs Reviewed  COMPREHENSIVE METABOLIC PANEL - Abnormal; Notable for the following components:      Result Value   Glucose, Bld 301 (*)    Albumin 3.4 (*)    Total Bilirubin 1.4 (*)    All other components within normal limits  CBC - Abnormal; Notable for the following components:   RBC 5.16 (*)    All other components within normal limits  CBG MONITORING, ED - Abnormal; Notable for the following components:   Glucose-Capillary 333 (*)    All other components within normal limits  RESP PANEL BY RT-PCR (RSV, FLU A&B, COVID)  RVPGX2  LIPASE, BLOOD  URINALYSIS, ROUTINE W REFLEX MICROSCOPIC    EKG EKG Interpretation Date/Time:  Friday August 14 2023 23:43:00 EST Ventricular Rate:  93 PR Interval:  131 QRS Duration:  87 QT Interval:  455 QTC Calculation: 566 R Axis:   18  Text Interpretation: Sinus rhythm Borderline T abnormalities, inferior leads Confirmed by Zadie Rhine ([id number]) on 08/14/2023 11:47:42 PM  Radiology CT ABDOMEN PELVIS W CONTRAST Result Date: 08/15/2023 CLINICAL DATA:  Right-sided abdominal pain EXAM: CT ABDOMEN AND PELVIS WITH CONTRAST TECHNIQUE: Multidetector CT imaging of the abdomen and  pelvis was performed using the standard protocol following bolus administration of intravenous contrast. RADIATION DOSE REDUCTION: This exam was performed according to the departmental dose-optimization program which includes automated exposure control, adjustment of the mA and/or kV according to patient size and/or use of iterative reconstruction technique. CONTRAST:  OMNIPAQUE IOHEXOL 300 MG/ML  SOLN COMPARISON:  06/07/2022 FINDINGS: Lower chest: No acute abnormality. Hepatobiliary: No focal liver abnormality is seen. No gallstones, gallbladder wall thickening, or biliary dilatation. Pancreas: Unremarkable. No pancreatic ductal dilatation or surrounding inflammatory changes. Spleen: Small hypodensity is noted best seen on image number 15 of series 7 likely representing a small hemangioma. Adrenals/Urinary Tract: Adrenal glands are within normal limits. Kidneys demonstrate a normal enhancement pattern bilaterally. No renal calculi or obstructive changes are seen. The bladder is well distended. Stomach/Bowel: No obstructive or inflammatory changes of the colon are noted. The appendix is within normal limits. Small bowel and stomach are unremarkable. Vascular/Lymphatic: Aortic atherosclerosis. No enlarged abdominal or pelvic lymph nodes. Reproductive: Uterus and bilateral adnexa are unremarkable. Other: No abdominal wall hernia or abnormality.  No abdominopelvic ascites. Musculoskeletal: No acute or significant osseous findings. IMPRESSION: No acute abnormality to correspond with the given clinical history. 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 Electronically Signed   By: Alcide Clever M.D.   On: 08/15/2023 03:28   CT Head Wo Contrast Result Date: 08/15/2023 CLINICAL DATA:  Mental status change, unknown cause EXAM: CT HEAD WITHOUT CONTRAST TECHNIQUE: Contiguous axial images were obtained from the base of the skull through the vertex without intravenous contrast. RADIATION DOSE REDUCTION:  This exam was performed according to the departmental dose-optimization program which includes automated exposure control, adjustment of the mA and/or kV according to patient size and/or use of iterative reconstruction technique. COMPARISON:  MRI head December 13, 2022. FINDINGS: Brain: No evidence of acute infarction, hemorrhage, hydrocephalus, extra-axial collection or mass lesion/mass effect. Small remote right basal ganglia lacunar infarct. Vascular: No hyperdense vessel. Skull: No acute fracture. Sinuses/Orbits: Clear sinuses. No acute orbital findings. Similar hyperdense left globe. Other: No mastoid effusions. IMPRESSION: No evidence of acute intracranial abnormality. Electronically Signed   By: Feliberto Harts M.D.   On: 08/15/2023 03:24   DG Chest Port 1 View Result Date: 08/14/2023 CLINICAL DATA:  Abdominal pain nausea EXAM: PORTABLE CHEST 1 VIEW COMPARISON:  12/06/2021 FINDINGS: The heart size and mediastinal contours are within normal limits. Aortic atherosclerosis. Both lungs are clear. The visualized skeletal structures are unremarkable. IMPRESSION: No active disease. Electronically Signed   By: Jasmine Pang M.D.   On: 08/14/2023 23:34    Procedures Procedures    Medications Ordered in ED Medications  fentaNYL (SUBLIMAZE) injection 50 mcg (50 mcg Intravenous Given 08/14/23 2351)  ondansetron (ZOFRAN) injection 4 mg (4 mg Intravenous Given 08/14/23 2352)  iohexol (OMNIPAQUE) 300 MG/ML solution 100 mL (100 mLs Intravenous Contrast Given 08/15/23 0214)    ED Course/ Medical Decision Making/ A&P Clinical Course as of 08/15/23 0358  Sat Aug 15, 2023  0028 Patient presents for multiple complaints, mostly having right lower quadrant abdominal pain.  Reported history of expressive aphasia earlier but that is since resolved this her speech is back to baseline without any focal neurodeficits. CT head pending, will also obtain CT abdomen pelvis. [DW]  [phone] Patient resting calmly, no acute  changes awaiting CT imaging [DW]  0356 Overall workup has been reassuring.  Aside from hyperglycemia, labs are unremarkable.  CT head and abdomen pelvis with both negative.  Overall patient is feeling improved though does still have some mild abdominal pain.  Patient is requesting discharge home. [DW]  [phone] I had a long discussion with patient, daughter and her niece.  I advised that at this time we have not had a full determination of what caused her symptoms. [DW]  [phone]   Patient still insist on being discharged. Discussed at length strict ER return precautions.  Also advised that if she has any new weakness or slurred speech they are to call 911 immediately Patient request short course of pain medicine at discharge Patient did not want a wait on a urinalysis, advise she can follow with the PCP [DW]    Clinical Course User Index [DW] Zadie Rhine, MD                                 Medical Decision Making Amount and/or Complexity of Data Reviewed Labs: ordered. Radiology: ordered. ECG/medicine tests: ordered.  Risk Prescription drug management.   This patient presents to the ED for concern  of abdominal pain, this involves an extensive number of treatment options, and is a complaint that carries with it a high risk of complications and morbidity.  The differential diagnosis includes but is not limited to cholecystitis, cholelithiasis, pancreatitis, gastritis, peptic ulcer disease, appendicitis, bowel obstruction, bowel perforation, diverticulitis, AAA, ischemic bowel, acute coronary syndrome    Comorbidities that complicate the patient evaluation: Patient's presentation is complicated by their history of CVA  Social Determinants of Health: Patient's  previous tobacco use   increases the complexity of managing their presentation  Additional history obtained: Additional history obtained from family Records reviewed previous admission documents  Lab Tests: I Ordered, and  personally interpreted labs.  The pertinent results include: hyperGlycemia  Imaging Studies ordered: I ordered imaging studies including CT scan head and CT abdomen pelvis and X-ray chest   I independently visualized and interpreted imaging which showed no acute findings I agree with the radiologist interpretation  Cardiac Monitoring: The patient was maintained on a cardiac monitor.  I personally viewed and interpreted the cardiac monitor which showed an underlying rhythm of:  sinus rhythm  Medicines ordered and prescription drug management: I ordered medication including fentanyl for pain Reevaluation of the patient after these medicines showed that the patient    improved  Reevaluation: After the interventions noted above, I reevaluated the patient and found that they have :improved  Complexity of problems addressed: Patient's presentation is most consistent with  acute presentation with potential threat to life or bodily function  Disposition: After consideration of the diagnostic results and the patient's response to treatment,  I feel that the patent would benefit from discharge   .           Final Clinical Impression(s) / ED Diagnoses Final diagnoses:  Confusion  Right lower quadrant abdominal pain    Rx / DC Orders ED Discharge Orders          Ordered    HYDROcodone-acetaminophen (NORCO/VICODIN) 5-325 MG tablet  Every 6 hours PRN        08/15/23 0356              Zadie Rhine, MD 08/15/23 [phone]

## 2023-08-18 NOTE — Progress Notes (Deleted)
  Cardiology Office Note:  .   Date:  08/18/2023  ID:  Karina Pierce, DOB 1956/02/17, MRN [id number] PCP: Leilani Able, MD  [facility] Providers Cardiologist:  Rollene Rotunda, MD {  History of Present Illness: .   Karina Pierce is a 68 y.o. female CAD status post DES to RCA June 2023, TIA, hypertension, hyperlipidemia, type 2 diabetes, anemia, former tobacco abuse.  June 2023 had NSTEMI, but patient initially presented as syncope, echocardiogram showing preserved EF with normal wall motion.  Underwent heart catheterization showing 95% stenosis of proximal to mid RCA and 30% stenosis of mid LAD.  RCA was successfully stented.  Initially on Brilinta however had dyspnea so was transition to Plavix.  Had TIA in June 2024 was some DAPT for 3 weeks.  MRI with small right frontal infarct.  Seen in clinic January 2025 complaining of back pain was very limiting.  Noted to have some chest discomfort but off of all of her medications.  Started back on Toprol-XL 25 mg.  Reported not able to tolerate stress test or heart catheterization due to inability to lie flat she returns to follow-up today to reevaluate symptoms.  Also recently seen 08/14/2023 for confusion/expressive aphasia, CT head normal and overall reassuring workup.  Still unclear of underlying etiology.  They were asked to be discharged so were not admitted.  CAD status post DES in June 2023 Noted some chest discomfort while off of all medications at last visit in January.  Hypertension Controlled  Hyperlipidemia LDL 158.  Type 2 diabetes A1c 9%.  Uncontrolled and needs to follow-up with PCP.  Declined taking Mounjaro despite prescription.  ROS: ***  Studies Reviewed: .        *** Risk Assessment/Calculations:   {Does this patient have ATRIAL FIBRILLATION?[phone]} No BP recorded.  {Refresh Note OR Click here to enter BP  :1}***       Physical Exam:   VS:  There were no vitals taken for this visit.   Wt Readings from Last 3  Encounters:  08/14/23 225 lb (102.1 kg)  07/22/23 224 lb (101.6 kg)  10/09/22 235 lb 12.8 oz (107 kg)    GEN: Well nourished, well developed in no acute distress NECK: No JVD; No carotid bruits CARDIAC: ***RRR, no murmurs, rubs, gallops RESPIRATORY:  Clear to auscultation without rales, wheezing or rhonchi  ABDOMEN: Soft, non-tender, non-distended EXTREMITIES:  No edema; No deformity   ASSESSMENT AND PLAN: .   ***    {Are you ordering a CV Procedure (e.g. stress test, cath, DCCV, TEE, etc)?   Press F2        :[id number]}  Dispo: ***  Signed, Abagail Kitchens, PA-C

## 2023-08-19 ENCOUNTER — Ambulatory Visit: Payer: 59 | Admitting: Physician Assistant

## 2023-08-26 ENCOUNTER — Other Ambulatory Visit: Payer: Self-pay | Admitting: Cardiology

## 2023-08-28 NOTE — Progress Notes (Signed)
 Patient with viral illness in the middle of flu season

## 2023-09-12 NOTE — Progress Notes (Deleted)
  Cardiology Office Note:  .   Date:  09/12/2023  ID:  Karina Pierce, DOB [birth year], MRN [id number] PCP: Leilani Able, MD  [facility] Providers Cardiologist:  Rollene Rotunda, MD  History of Present Illness: .   Karina Pierce is a 68 y.o. female with a past medical history of NSTEMI, CAD, HTN, HLD, type 2 DM, former tobacco use. Patient is followed by Dr. Antoine Poche, presents today for a follow up appointment  Per chart review, patient was admitted in 11/2021 with NSTEMI. Echocardiogram 11/30/21 showed EF 60-65%, no regional wall motion abnormalities, normal RV function, mild MR. LHC on 12/02/21 showed 30% stenosis in the mid LAD, 95% stenosis in the prox-mid RCA. Treated with DES to the RCA. Cardiac monitor in 12/2021 showed normal sinus rhythm with a few, brief runs of SVT.   Patent had a TIA in 11/2022. Echocardiogram 12/14/22 showed EF 55-60%, no regional wall motion abnormalities, normal RV function, mild MR. Agitated saline contrast bubble study was negative. Carotid imaging was unremarkable. Patient was last seen by cardiology 07/22/23. At that time, patient complained of significant back pain. She had stopped taking her medications. Reported occasional chest discomfort. She was started back on ASA, metoprolol, crestor. She reported that she could not lay flat for cath or stress test due to back pain  CAD  - Patient previously received DES to the RCA in 11/2021  - When last seen by Dr. Antoine Poche on 07/22/23, patient had stopped taking all of her cardiac medications. Reported occasional episodes of chest pain, was started back on metoprolol, asa, statin  -  - Continue ASA 81 mg daily  - Continue metoprolol succinate 25 mg daily  - Continue crestor 40 mg daily   HTN  - BP well controlled *** - Continue metoprolol succinate 25 mg daily   HLD  - Lipid panel from 11/2022 showed LDL 158, HDL 46, triglycerides 145, total cholesterol 233 - Continue crestor 40 mg daily   Type 2 DM  - A1c 9.0 in  11/2022  - When seen by Dr. Antoine Poche in 07/2023, patient had stopped taking all of her diabetes medications - Followed by PCP  -   Back Pain   ROS: ***  Studies Reviewed: .        *** Risk Assessment/Calculations:   {Does this patient have ATRIAL FIBRILLATION?:[phone]} No BP recorded.  {Refresh Note OR Click here to enter BP  :1}***       Physical Exam:   VS:  There were no vitals taken for this visit.   Wt Readings from Last 3 Encounters:  08/14/23 225 lb (102.1 kg)  07/22/23 224 lb (101.6 kg)  10/09/22 235 lb 12.8 oz (107 kg)    GEN: Well nourished, well developed in no acute distress NECK: No JVD; No carotid bruits CARDIAC: ***RRR, no murmurs, rubs, gallops RESPIRATORY:  Clear to auscultation without rales, wheezing or rhonchi  ABDOMEN: Soft, non-tender, non-distended EXTREMITIES:  No edema; No deformity   ASSESSMENT AND PLAN: .   ***    {Are you ordering a CV Procedure (e.g. stress test, cath, DCCV, TEE, etc)?   Press F2        :[id number]}  Dispo: ***  Signed, Jonita Albee, PA-C

## 2023-09-15 ENCOUNTER — Ambulatory Visit: Payer: 59 | Admitting: Physician Assistant

## 2023-09-16 ENCOUNTER — Ambulatory Visit: Payer: 59 | Attending: Cardiology | Admitting: Cardiology

## 2023-10-01 ENCOUNTER — Other Ambulatory Visit: Payer: Self-pay | Admitting: Cardiology

## 2023-10-17 ENCOUNTER — Other Ambulatory Visit: Payer: Self-pay | Admitting: Cardiology

## 2023-12-16 ENCOUNTER — Other Ambulatory Visit: Payer: Self-pay | Admitting: Cardiology

## 2024-06-18 ENCOUNTER — Other Ambulatory Visit: Payer: Self-pay | Admitting: Cardiology
